# Patient Record
Sex: Male | Born: 1935 | Race: Black or African American | Hispanic: No | Marital: Married | Smoking: Former smoker
Health system: Southern US, Community
[De-identification: ages and names within clinical notes are randomized; demographics above are authoritative.]

## PROBLEM LIST (undated history)

## (undated) DIAGNOSIS — C61 Malignant neoplasm of prostate: Secondary | ICD-10-CM

## (undated) DIAGNOSIS — I272 Pulmonary hypertension, unspecified: Secondary | ICD-10-CM

## (undated) DIAGNOSIS — E7800 Pure hypercholesterolemia, unspecified: Secondary | ICD-10-CM

## (undated) DIAGNOSIS — J9611 Chronic respiratory failure with hypoxia: Secondary | ICD-10-CM

## (undated) DIAGNOSIS — I10 Essential (primary) hypertension: Secondary | ICD-10-CM

## (undated) DIAGNOSIS — I2699 Other pulmonary embolism without acute cor pulmonale: Secondary | ICD-10-CM

## (undated) DIAGNOSIS — Z720 Tobacco use: Secondary | ICD-10-CM

## (undated) DIAGNOSIS — G629 Polyneuropathy, unspecified: Secondary | ICD-10-CM

## (undated) DIAGNOSIS — R0600 Dyspnea, unspecified: Secondary | ICD-10-CM

## (undated) DIAGNOSIS — J449 Chronic obstructive pulmonary disease, unspecified: Secondary | ICD-10-CM

## (undated) DIAGNOSIS — I428 Other cardiomyopathies: Secondary | ICD-10-CM

## (undated) DIAGNOSIS — I7100 Dissection of unspecified site of aorta: Secondary | ICD-10-CM

## (undated) DIAGNOSIS — I442 Atrioventricular block, complete: Secondary | ICD-10-CM

## (undated) DIAGNOSIS — M1990 Unspecified osteoarthritis, unspecified site: Secondary | ICD-10-CM

## (undated) HISTORY — PX: PROSTATECTOMY: SHX69

## (undated) HISTORY — DX: Pure hypercholesterolemia, unspecified: E78.00

## (undated) HISTORY — PX: BI-VENTRICULAR PACEMAKER INSERTION (CRT-P): SHX5750

## (undated) HISTORY — DX: Malignant neoplasm of prostate: C61

## (undated) HISTORY — DX: Essential (primary) hypertension: I10

## (undated) HISTORY — DX: Chronic obstructive pulmonary disease, unspecified: J44.9

## (undated) HISTORY — DX: Tobacco use: Z72.0

## (undated) HISTORY — DX: Other cardiomyopathies: I42.8

## (undated) HISTORY — DX: Unspecified osteoarthritis, unspecified site: M19.90

## (undated) HISTORY — DX: Polyneuropathy, unspecified: G62.9

## (undated) HISTORY — DX: Other pulmonary embolism without acute cor pulmonale: I26.99

## (undated) HISTORY — DX: Atrioventricular block, complete: I44.2

---

## 1998-09-18 ENCOUNTER — Ambulatory Visit: Admission: RE | Admit: 1998-09-18 | Discharge: 1998-09-18 | Payer: Self-pay | Admitting: Pulmonary Disease

## 2000-02-06 ENCOUNTER — Ambulatory Visit (HOSPITAL_COMMUNITY): Admission: RE | Admit: 2000-02-06 | Discharge: 2000-02-06 | Payer: Self-pay | Admitting: *Deleted

## 2004-06-11 ENCOUNTER — Ambulatory Visit: Payer: Self-pay | Admitting: Internal Medicine

## 2005-03-11 ENCOUNTER — Ambulatory Visit: Payer: Self-pay | Admitting: Internal Medicine

## 2005-09-30 ENCOUNTER — Ambulatory Visit: Payer: Self-pay | Admitting: Internal Medicine

## 2005-10-14 ENCOUNTER — Ambulatory Visit: Payer: Self-pay | Admitting: Cardiology

## 2006-01-23 ENCOUNTER — Ambulatory Visit: Payer: Self-pay | Admitting: Internal Medicine

## 2006-02-17 ENCOUNTER — Ambulatory Visit (HOSPITAL_COMMUNITY): Admission: RE | Admit: 2006-02-17 | Discharge: 2006-02-17 | Payer: Self-pay | Admitting: Internal Medicine

## 2006-02-17 ENCOUNTER — Ambulatory Visit: Payer: Self-pay | Admitting: Internal Medicine

## 2006-04-07 ENCOUNTER — Ambulatory Visit: Payer: Self-pay | Admitting: Internal Medicine

## 2006-09-11 ENCOUNTER — Ambulatory Visit: Payer: Self-pay

## 2006-09-15 ENCOUNTER — Inpatient Hospital Stay (HOSPITAL_COMMUNITY): Admission: RE | Admit: 2006-09-15 | Discharge: 2006-09-16 | Payer: Self-pay | Admitting: Internal Medicine

## 2006-09-15 ENCOUNTER — Ambulatory Visit: Payer: Self-pay | Admitting: Internal Medicine

## 2006-10-07 ENCOUNTER — Ambulatory Visit: Payer: Self-pay | Admitting: Nurse Practitioner

## 2006-10-07 ENCOUNTER — Encounter: Payer: Self-pay | Admitting: Cardiology

## 2006-10-14 ENCOUNTER — Ambulatory Visit: Payer: Self-pay

## 2007-01-05 ENCOUNTER — Ambulatory Visit: Payer: Self-pay | Admitting: Internal Medicine

## 2007-02-19 ENCOUNTER — Ambulatory Visit: Payer: Self-pay | Admitting: Internal Medicine

## 2007-02-25 ENCOUNTER — Ambulatory Visit: Payer: Self-pay | Admitting: Cardiology

## 2007-02-25 ENCOUNTER — Ambulatory Visit: Payer: Self-pay | Admitting: Internal Medicine

## 2007-07-13 ENCOUNTER — Encounter: Payer: Self-pay | Admitting: Cardiology

## 2007-08-13 ENCOUNTER — Ambulatory Visit: Payer: Self-pay | Admitting: Internal Medicine

## 2008-02-18 ENCOUNTER — Ambulatory Visit: Payer: Self-pay | Admitting: Internal Medicine

## 2008-04-15 ENCOUNTER — Ambulatory Visit: Payer: Self-pay | Admitting: Cardiology

## 2008-04-16 ENCOUNTER — Encounter: Payer: Self-pay | Admitting: Cardiology

## 2008-04-17 ENCOUNTER — Encounter: Payer: Self-pay | Admitting: Cardiology

## 2008-04-19 ENCOUNTER — Ambulatory Visit: Admission: RE | Admit: 2008-04-19 | Discharge: 2008-04-19 | Payer: Self-pay | Admitting: Cardiology

## 2008-04-19 ENCOUNTER — Encounter: Payer: Self-pay | Admitting: Cardiology

## 2008-04-21 ENCOUNTER — Ambulatory Visit: Payer: Self-pay | Admitting: Cardiology

## 2008-04-21 ENCOUNTER — Ambulatory Visit (HOSPITAL_COMMUNITY): Admission: RE | Admit: 2008-04-21 | Discharge: 2008-04-21 | Payer: Self-pay | Admitting: Cardiology

## 2008-05-03 ENCOUNTER — Ambulatory Visit: Payer: Self-pay | Admitting: Cardiology

## 2008-05-11 ENCOUNTER — Ambulatory Visit: Payer: Self-pay | Admitting: Cardiology

## 2008-06-06 ENCOUNTER — Encounter: Payer: Self-pay | Admitting: Cardiology

## 2008-06-06 ENCOUNTER — Ambulatory Visit (HOSPITAL_COMMUNITY): Admission: RE | Admit: 2008-06-06 | Discharge: 2008-06-06 | Payer: Self-pay | Admitting: Cardiology

## 2008-06-06 ENCOUNTER — Ambulatory Visit: Payer: Self-pay | Admitting: Internal Medicine

## 2008-06-19 ENCOUNTER — Ambulatory Visit: Payer: Self-pay | Admitting: Cardiology

## 2008-09-16 ENCOUNTER — Encounter (INDEPENDENT_AMBULATORY_CARE_PROVIDER_SITE_OTHER): Payer: Self-pay | Admitting: *Deleted

## 2008-12-25 ENCOUNTER — Ambulatory Visit: Payer: Self-pay | Admitting: Cardiology

## 2009-01-31 ENCOUNTER — Encounter: Payer: Self-pay | Admitting: Cardiology

## 2009-02-21 DIAGNOSIS — R0602 Shortness of breath: Secondary | ICD-10-CM | POA: Insufficient documentation

## 2009-02-21 DIAGNOSIS — Z950 Presence of cardiac pacemaker: Secondary | ICD-10-CM

## 2009-02-21 DIAGNOSIS — I429 Cardiomyopathy, unspecified: Secondary | ICD-10-CM | POA: Insufficient documentation

## 2009-03-01 ENCOUNTER — Encounter: Payer: Self-pay | Admitting: Cardiology

## 2009-03-29 ENCOUNTER — Encounter: Payer: Self-pay | Admitting: Cardiology

## 2009-07-17 ENCOUNTER — Ambulatory Visit: Payer: Self-pay | Admitting: Cardiology

## 2009-07-17 DIAGNOSIS — Z86718 Personal history of other venous thrombosis and embolism: Secondary | ICD-10-CM

## 2009-07-17 DIAGNOSIS — I442 Atrioventricular block, complete: Secondary | ICD-10-CM

## 2009-07-17 DIAGNOSIS — J439 Emphysema, unspecified: Secondary | ICD-10-CM

## 2009-07-20 ENCOUNTER — Encounter (INDEPENDENT_AMBULATORY_CARE_PROVIDER_SITE_OTHER): Payer: Self-pay | Admitting: *Deleted

## 2009-08-03 ENCOUNTER — Encounter: Payer: Self-pay | Admitting: Cardiology

## 2010-01-21 ENCOUNTER — Encounter (INDEPENDENT_AMBULATORY_CARE_PROVIDER_SITE_OTHER): Payer: Self-pay | Admitting: *Deleted

## 2010-02-04 ENCOUNTER — Encounter: Payer: Self-pay | Admitting: Cardiology

## 2010-02-07 ENCOUNTER — Ambulatory Visit: Payer: Self-pay | Admitting: Cardiology

## 2010-02-07 DIAGNOSIS — G609 Hereditary and idiopathic neuropathy, unspecified: Secondary | ICD-10-CM | POA: Insufficient documentation

## 2010-02-15 ENCOUNTER — Encounter: Payer: Self-pay | Admitting: Cardiology

## 2010-02-23 ENCOUNTER — Encounter: Payer: Self-pay | Admitting: Internal Medicine

## 2010-02-27 ENCOUNTER — Encounter (INDEPENDENT_AMBULATORY_CARE_PROVIDER_SITE_OTHER): Payer: Self-pay | Admitting: *Deleted

## 2010-04-24 ENCOUNTER — Encounter: Payer: Self-pay | Admitting: Cardiology

## 2010-05-06 ENCOUNTER — Encounter: Payer: Self-pay | Admitting: Internal Medicine

## 2010-05-06 ENCOUNTER — Ambulatory Visit: Payer: Self-pay | Admitting: Internal Medicine

## 2010-06-25 NOTE — Letter (Signed)
Summary: Engineer, materials at [facility]  [address]   [place], [place] [address]   Phone: [phone]  Fax: [phone]        July 20, 2009 MRN: [id number]   TRESHON STANNARD [address] [place], [place]  [address]   Dear Mr. Schildt,  Your test ordered by Selena Batten has been reviewed by your physician (or physician assistant) and was found to be normal or stable. Your physician (or physician assistant) felt no changes were needed at this time.  ____ Echocardiogram  ____ Cardiac Stress Test  _X___ Lab Work  ____ Peripheral vascular study of arms, legs or neck  ____ CT scan or X-ray  ____ Lung or Breathing test  ____ Other:   Thank you.   Hoover Brunette, LPN    Duane Boston, M.D., F.A.C.C. Thressa Sheller, M.D., F.A.C.C. Oneal Grout, M.D., F.A.C.C. Cheree Ditto, M.D., F.A.C.C. Daiva Nakayama, M.D., F.A.C.C. Kenney Houseman, M.D., F.A.C.C. Jeanne Ivan, PA-C

## 2010-06-25 NOTE — Letter (Signed)
Summary: External Correspondence/ [facility]  External Correspondence/ [facility]   Imported By: Dorise Hiss 04/24/2010 09:26:10  _____________________________________________________________________  External Attachment:    Type:   Image     Comment:   External Document

## 2010-06-25 NOTE — Letter (Signed)
Summary: Device-Delinquent Check  [facility]  [address], [place] [address]   Phone: [phone]  Fax: [phone]     January 21, 2010 MRN: [id number]   KOBI MARIO [address] [place], [place]  [address]   Dear Mr. Chandler,  According to our records, you have not had your implanted device checked in the recommended period of time.  We are unable to determine appropriate device function without checking your device on a regular basis.  Please call our office to schedule an appointment , with Dr.Allred in [place], as soon as possible.  If you are having your device checked by another physician, please call us so that we may update our records.  Thank you,  Altha Harm, LPN  January 21, 2010 4:17 PM  [facility]  certified mail  Appended Document: Device-Delinquent Check Patient called back regarding above letter.  States he would like to continue to see Dr. Graciela Husbands instead of Dr. Johney Frame.  He is aware that he would have to come to [facility]. office for this.  Does have scheduled visit with Dr. Johney Frame for December.  Please call pt to schedule device check and [facility] appt. May call back at home number or cell:  [phone].  Appended Document: Device-Delinquent Check Please address.

## 2010-06-25 NOTE — Assessment & Plan Note (Signed)
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 He also has a history of pulmonary embolism and is on chronic Coumadin therapy. From cardiac standpoint has been doing well. He denies any chest pain orthopnea PND palpitations or syncope.  He reports a burning in tingling sensation in both lower extremities below the knee. He has no diabetes as far as we know. He had arterial Dopplers done in Dr. Margaretmary Eddy office and report was negative. I recommended for the patient to proceed with a neurology evaluation.  Preventive Screening-Counseling & Management  Alcohol-Tobacco     Smoking Status: quit     Year Quit: 1995  Current Medications (verified): 1)  Flovent Hfa 44 Mcg/act Aero (Fluticasone Propionate  Hfa) .... Inhale 2 Puff Using Inhaler Twice A Day 2)  Carvedilol 25 Mg Tabs (Carvedilol) .... Take 1 Tablet By Mouth Two Times A Day 3)  Warfarin Sodium 5 Mg Tabs (Warfarin Sodium) .... Use As Directed 4)  Multivitamins  Tabs (Multiple Vitamin) .... Take 1 Tablet By Mouth Once A Day 5)  Doxycycline Hyclate 100 Mg Solr (Doxycycline Hyclate) .... Take 1 Tablet By Mouth Two Times A Day 6)  Prednisone 10 Mg Tabs (Prednisone) .... Use As Directed  Allergies (verified): 1)  ! Asa  Comments:  Nurse/Medical Assistant: The patient is currently on medications but does not know the name or dosage at this time. Instructed to contact our office with details. Will update medication list at that time.  Past History:  Past Medical History: Last updated: 07/17/2009 1. Chronic exertional dyspnea secondary to chronic obstructive     pulmonary disease. 2. History of pulmonary  embolism on chronic Coumadin therapy. 3. Mild nonischemic cardiomyopathy with ejection fraction 45-50%. 4. History of complete heart block.     a.     Status post permanent pacemaker implantation of CRT device      and defibrillator upgrade in 2008. 5. Lisinopril causes angioedema. 6. Tobacco use.  Past Surgical History: Last updated: 02/21/2009 Prostatectomy ICD - St Jude [place]  Social History: Last updated: 02/21/2009 Married  Tobacco Use - Former.  Alcohol Use - yes  Risk Factors: Smoking Status: quit (02/07/2010)  Review of Systems  The patient denies fatigue, malaise, fever, weight gain/loss, vision loss, decreased hearing, hoarseness, chest pain, palpitations, shortness of breath, prolonged cough, wheezing, sleep apnea, coughing up blood, abdominal pain, blood in stool, nausea, vomiting, diarrhea, heartburn, incontinence, blood in urine, muscle weakness, joint pain, leg swelling, rash, skin lesions, headache, fainting, dizziness, depression, anxiety, enlarged lymph nodes, easy bruising or bleeding, and environmental allergies.    Vital Signs:  Patient profile:   77 year old male Height:      71 inches Weight:      183 pounds Pulse rate:   72 / minute BP sitting:   144 / 90  (left arm) Cuff size:   regular  Vitals Entered By: Carlye Grippe (February 07, 2010 10:05 AM)  Physical Exam  Additional Exam:  General: Well-developed, well-nourished in no distress head: Normocephalic and atraumatic eyes PERRLA/EOMI intact, conjunctiva and lids normal nose: No deformity or lesions mouth normal dentition, normal posterior pharynx neck: Supple, no JVD.  No masses, thyromegaly or  abnormal cervical nodes Chest: Device without any swelling or erythema lungs: Normal breath sounds bilaterally without wheezing.  Normal percussion heart: regular rate and rhythm with normal S1 and S2, no S3 or S4.  PMI is normal.  No pathological murmurs abdomen: Normal bowel sounds, abdomen is  soft and nontender without masses, organomegaly or hernias noted.  No hepatosplenomegaly musculoskeletal: Back normal, normal gait muscle strength and tone normal pulsus: Pulse is normal in all 4 extremities Extremities: No peripheral pitting edema neurologic: Alert and oriented x 3 skin: Intact without lesions or rashes cervical nodes: No significant adenopathy psychologic: Normal affect    PPM Specifications Following MD:  Sherryl Manges, MD     PPM Vendor:  St Jude     PPM Model Number:  [id number]     PPM Serial Number:  [id number] PPM DOI:  09/15/2006     PPM Implanting MD:  Sherryl Manges, MD  Lead 1    Location: RA     DOI: 07/27/1997     Model #: [id number]     Serial #: [id number]     Status: active Lead 2    Location: RV     DOI: 07/27/1997     Model #: [id number]     Serial #: [id number]     Status: active Lead 3    Location: LV     DOI: 09/15/2006     Model #: [id number]     Serial #: [id number]     Status: active   Indications:  ICM, Complete heart block   Impression & Recommendations:  Problem # 1:  PULMONARY EMBOLISM, HX OF (ICD-V12.51) patient has a history of DVT and pulmonary was on 2 separate occasions and will be on lifelong Coumadin therapy. His updated medication list for this problem includes:    Warfarin Sodium 5 Mg Tabs (Warfarin sodium) ..... Use as directed  Problem # 2:  ICD - IN SITU (ICD-V45.02) no defibrillator discharges reported. Patient has a CRT-D device  Problem # 3:  CARDIOMYOPATHY, SECONDARY (ICD-425.9) the patient has a nonischemic cardiomyopathy and reports no heart failure symptoms. He only has mild LV dysfunction he cannot be placed on ACE inhibitors due to a history of angioedema. His updated medication list for this problem includes:    Carvedilol 25 Mg Tabs (Carvedilol) .Marland Kitchen... Take 1 tablet by mouth two times a day    Warfarin Sodium 5 Mg Tabs (Warfarin sodium) ..... Use as directed  Problem # 4:  PERIPHERAL NEUROPATHY (ICD-356.9) unclear etiology. The patient will  be referred to neurology EMGs and nerve conduction velocity studies may be indicated.  Patient Instructions: 1)  EP follow up  2)  Referral for Neurology in [place] 3)  Follow up in  6 months

## 2010-06-25 NOTE — Letter (Signed)
Summary: External Correspondence/ FAXED [facility]  External Correspondence/ [facility]   Imported By: Dorise Hiss 03/12/2010 15:24:17  _____________________________________________________________________  External Attachment:    Type:   Image     Comment:   External Document

## 2010-06-25 NOTE — Letter (Signed)
Summary: Device-Delinquent Check  [facility], Main Office  [address]   [place], [place] [address]   Phone: [phone]  Fax: [phone]     February 27, 2010 MRN: [id number]   [name] [name] [address] [place], [place]  [address]   Dear [name],  According to our records, you have not had your implanted device checked in the recommended period of time.  We are unable to determine appropriate device function without checking your device on a regular basis.  Please call our office to schedule an appointment as soon as possible with Dr. Graciela Husbands.  If you are having your device checked by another physician, please call us so that we may update our records.  Thank you,   [facility]

## 2010-06-25 NOTE — Assessment & Plan Note (Signed)
Summary: 1 yr fu recv letter   Visit Type:  Follow-up Primary Provider:  [name]  CC:  follow-up visit.  History of Present Illness: the patient is a 77 year old male with a history of chronic dyspnea secondary to COPD. The patient also has a history of pulmonary and wasn't is on chronic Coumadin therapy. He has a history of complete heart block status post permanent pacemaker implantation with a CRT-D device. The patient had an upgrade in 2008.the patient is unable to tolerate ACE inhibitors due to angioedema.  The patient recently had increased shortness of breath and has been seeing Dr. Orson Aloe. It was felt that his dyspnea secondary to COPD exacerbation. The patient is an NYHA class IIB. He reports occasional dry hacking cough. He denies any chest pain orthopnea PND. His electrocardiogram demonstrates an AV sequential paced rhythm with dual chamber pacing.  Clinical Review Panels:  CXR CXR results Mild cardiomegaly is stable. Marked enlargement of the         central pulmonary arteries is again seen, consistent with pulmonary         arterial hypertension.  Pulmonary hyperinflation is again seen,         consistent with COPD.  Triple lead transvenous pacemaker remains in         stable position.                   Both lungs are clear.  There is no evidence of pleural effusion.                   IMPRESSION:         1.  No acute findings.         2.  Pulmonary arterial hypertension.  Mild cardiomegaly. (04/15/2008)  Echocardiogram Echocardiogram  Conclusions:         1. The estimated ejection fraction is 45-50%.          2. Global left ventricular wall motion and contractility are within         normal limits.         3. Mild concentric left ventricular hypertrophy is observed.         4. The  basal inferolateral, basal inferior and mid inferolateral wall         segments are hypokinetic          5. A pacemaker wire is visualized in the right ventricle.         6. The right  atrium is mildly dilated.          7. A pacemaker wire is visualized in the right atrium.         8. There is mitral annular calcification.         9. There is a trace of mitral regurgitation.         10. There is mild tricuspid regurgitation.         11. The right ventricular systolic pressure is calculated at 47 mmHg. (04/17/2008)    Preventive Screening-Counseling & Management  Alcohol-Tobacco     Smoking Status: quit     Year Quit: 1995  Current Medications (verified): 1)  Flovent Hfa 44 Mcg/act Aero (Fluticasone Propionate  Hfa) .... Inhale 2 Puff Using Inhaler Twice A Day 2)  Carvedilol 25 Mg Tabs (Carvedilol) .... Take 1 Tablet By Mouth Two Times A Day 3)  Warfarin Sodium 5 Mg Tabs (Warfarin Sodium) .... Use As Directed 4)  Multivitamins  Tabs (Multiple Vitamin) .Marland KitchenMarland KitchenMarland Kitchen  Take 1 Tablet By Mouth Once A Day 5)  Doxycycline Hyclate 100 Mg Solr (Doxycycline Hyclate) .... Take 1 Tablet By Mouth Two Times A Day 6)  Prednisone 10 Mg Tabs (Prednisone) .... Use As Directed  Allergies (verified): 1)  ! Asa  Comments:  Nurse/Medical Assistant: The patient's medications and allergies were reviewed with the patient and were updated in the Medication and Allergy Lists.Bottles reviewed.  Review of Systems       The patient complains of shortness of breath.  The patient denies fatigue, malaise, fever, weight gain/loss, vision loss, decreased hearing, hoarseness, chest pain, palpitations, prolonged cough, wheezing, sleep apnea, coughing up blood, abdominal pain, blood in stool, nausea, vomiting, diarrhea, heartburn, incontinence, blood in urine, muscle weakness, joint pain, leg swelling, rash, skin lesions, headache, fainting, dizziness, depression, anxiety, enlarged lymph nodes, easy bruising or bleeding, and environmental allergies.    Vital Signs:  Patient profile:   77 year old male Height:      71 inches Weight:      181 pounds BMI:     25.34 Pulse rate:   69 / minute BP sitting:    128 / 74  (left arm) Cuff size:   regular  Vitals Entered By: Carlye Grippe (July 17, 2009 8:55 AM)  Nutrition Counseling: Patient's BMI is greater than 25 and therefore counseled on weight management options. CC: follow-up visit   Physical Exam  Additional Exam:  General: Well-developed, well-nourished in no distress head: Normocephalic and atraumatic eyes PERRLA/EOMI intact, conjunctiva and lids normal nose: No deformity or lesions mouth normal dentition, normal posterior pharynx neck: Supple, no JVD.  No masses, thyromegaly or abnormal cervical nodes Chest: Device without any swelling or erythema lungs: Normal breath sounds bilaterally without wheezing.  Normal percussion heart: regular rate and rhythm with normal S1 and S2, no S3 or S4.  PMI is normal.  No pathological murmurs abdomen: Normal bowel sounds, abdomen is soft and nontender without masses, organomegaly or hernias noted.  No hepatosplenomegaly musculoskeletal: Back normal, normal gait muscle strength and tone normal pulsus: Pulse is normal in all 4 extremities Extremities: No peripheral pitting edema neurologic: Alert and oriented x 3 skin: Intact without lesions or rashes cervical nodes: No significant adenopathy psychologic: Normal affect    PPM Specifications Following MD:  Sherryl Manges, MD     PPM Vendor:  St Jude     PPM Model Number:  [id number]     PPM Serial Number:  [id number] PPM DOI:  09/15/2006     PPM Implanting MD:  Sherryl Manges, MD  Lead 1    Location: RA     DOI: 07/27/1997     Model #: [id number]     Serial #: [id number]     Status: active Lead 2    Location: RV     DOI: 07/27/1997     Model #: [id number]     Serial #: [id number]     Status: active Lead 3    Location: LV     DOI: 09/15/2006     Model #: [id number]     Serial #: [id number]     Status: active   Indications:  ICM, Complete heart block   Impression & Recommendations:  Problem # 1:  COPD (ICD-496) the patient reports worsening dyspnea. I suspect this  is largely due to his COPD. There is no definite evidence of worsening heart failure symptoms by physical examination. His updated medication list for this problem includes:    Flovent Hfa  44 Mcg/act Aero (Fluticasone propionate  hfa) ..... Inhale 2 puff using inhaler twice a day  Problem # 2:  CARDIOMYOPATHY, SECONDARY (ICD-425.9) the patient only has mild LV systolic dysfunction with an ejection fraction of 45-50%. CRT-D appears to be functioning appropriately. I will check an SMA-7 and a BNP level to make sure the patient does not need diuretic therapy but I do not think so. His updated medication list for this problem includes:    Carvedilol 25 Mg Tabs (Carvedilol) .Marland Kitchen... Take 1 tablet by mouth two times a day    Warfarin Sodium 5 Mg Tabs (Warfarin sodium) ..... Use as directed  Orders: T-Basic Metabolic Panel [phone]) T-BNP  (B Natriuretic Peptide) ([id number])  Problem # 3:  ICD - IN SITU (ICD-V45.02) Assessment: Comment Only  Orders: EKG w/ Interpretation (93000)  Problem # 4:  COUMADIN THERAPY (ICD-V58.61) Assessment: Comment Only  Patient Instructions: 1)  Labs today 2)  Follow up in  6 months

## 2010-06-25 NOTE — Miscellaneous (Signed)
Summary: Orders Update - Neurology  Clinical Lists Changes  Orders: Added new Referral order of Neurology Referral (Neuro) - Signed 

## 2010-06-25 NOTE — Cardiovascular Report (Signed)
Summary: Certified Letter - Delivered (Appt. Made)  Certified Letter - Delivered (Appt. Made)   Imported By: Debby Freiberg 04/01/2010 14:34:59  _____________________________________________________________________  External Attachment:    Type:   Image     Comment:   External Document

## 2010-06-27 NOTE — Procedures (Signed)
Summary: pc2  --agh   Current Medications (verified): 1)  Flovent Hfa 44 Mcg/act Aero (Fluticasone Propionate  Hfa) .... Inhale 2 Puff Using Inhaler Twice A Day 2)  Carvedilol 25 Mg Tabs (Carvedilol) .... Take 1 Tablet By Mouth Two Times A Day 3)  Warfarin Sodium 5 Mg Tabs (Warfarin Sodium) .... Use As Directed 4)  Multivitamins  Tabs (Multiple Vitamin) .... Take 1 Tablet By Mouth Once A Day  Allergies (verified): 1)  ! Asa  PPM Specifications Following MD:  Sherryl Manges, MD     PPM Vendor:  St Jude     PPM Model Number:  [id number]     PPM Serial Number:  [id number] PPM DOI:  09/15/2006     PPM Implanting MD:  Sherryl Manges, MD  Lead 1    Location: RA     DOI: 07/27/1997     Model #: [id number]     Serial #: [id number]     Status: active Lead 2    Location: RV     DOI: 07/27/1997     Model #: [id number]     Serial #: [id number]     Status: active Lead 3    Location: LV     DOI: 09/15/2006     Model #: [id number]     Serial #: [id number]     Status: active   Indications:  ICM, Complete heart block   PPM Follow Up Battery Voltage:  2.78 V     Battery Est. Longevity:  2 yrs       PPM Device Measurements Atrium  Amplitude: 1.5 mV, Impedance: 339 ohms, Threshold: 0.75 V at 0.5 msec Right Ventricle  Amplitude: PACED mV, Impedance: 510 ohms, Threshold: 0.75 V at 0.5 msec Left Ventricle  Impedance: 435 ohms, Threshold: 0.75 V at 0.5 msec  Episodes MS Episodes:  16     Percent Mode Switch:  <1%     Ventricular High Rate:  0     Atrial Pacing:  90%     Ventricular Pacing:  >99%  Parameters Mode:  DDDR     Lower Rate Limit:  70     Upper Rate Limit:  125 Next Cardiology Appt Due:  10/25/2010 Tech Comments:  NORMAL DEVICE FUNCTION.  NO CHANGES MADE. ROV IN 6 MTHS W/SK. Vella Kohler  May 06, 2010 1:01 PM

## 2010-06-27 NOTE — Cardiovascular Report (Signed)
Summary: Office Visit   Office Visit   Imported By: Roderic Ovens 05/13/2010 16:19:24  _____________________________________________________________________  External Attachment:    Type:   Image     Comment:   External Document

## 2010-08-01 ENCOUNTER — Encounter: Payer: Self-pay | Admitting: *Deleted

## 2010-08-16 ENCOUNTER — Encounter: Payer: Self-pay | Admitting: Cardiology

## 2010-08-16 ENCOUNTER — Other Ambulatory Visit: Payer: Self-pay | Admitting: Cardiology

## 2010-08-16 ENCOUNTER — Ambulatory Visit (INDEPENDENT_AMBULATORY_CARE_PROVIDER_SITE_OTHER): Payer: Medicare Other | Admitting: Cardiology

## 2010-08-16 VITALS — BP 123/78 | HR 70 | Ht 71.0 in | Wt 177.0 lb

## 2010-08-16 DIAGNOSIS — R0602 Shortness of breath: Secondary | ICD-10-CM

## 2010-08-16 DIAGNOSIS — T783XXA Angioneurotic edema, initial encounter: Secondary | ICD-10-CM

## 2010-08-16 DIAGNOSIS — Z95810 Presence of automatic (implantable) cardiac defibrillator: Secondary | ICD-10-CM

## 2010-08-16 DIAGNOSIS — I429 Cardiomyopathy, unspecified: Secondary | ICD-10-CM

## 2010-08-16 NOTE — Assessment & Plan Note (Signed)
status post CRT-D: Reviewed EKG.  Normal biventricular pacing.

## 2010-08-16 NOTE — Progress Notes (Signed)
HPI The patient is an 77 year old male with a history of nonischemic cardiomyopathy.  Prior ejection fraction 45 to 50%.  The patient is status post permanent pacemaker implantation with a CRT-D device upgrade to defibrillator in 2008.  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  He also has a history of pulmonary embolism and is on chronic Coumadin therapy.  He was seen 6 months ago and he was doing well.  At that time he denied any chest pain or any other cardiovascular symptoms. The patient had blood work done by his primary care physician a few months ago.  His creatinine is .99 liver function tests are within normal limits potassium is within normal limits TSH is within normal limits.  CBC was also within normal limits. The patient's last echocardiogram was in 2009.  Allergies  Allergen Reactions  . Lisinopril Swelling    Angioedema  . Aspirin     Current Outpatient Prescriptions on File Prior to Visit  Medication Sig Dispense Refill  . carvedilol (COREG) 25 MG tablet Take 1 tablet by mouth twice per day.       . fluticasone (FLOVENT HFA) 44 MCG/ACT inhaler Inhale 2 puffs into the lungs 2 (two) times daily.        . Multiple Vitamin (MULTIVITAMIN) tablet Take 1 tablet by mouth daily.       Marland Kitchen warfarin (COUMADIN) 5 MG tablet Take daily by mouth as directed by the [facility] (Dr. [name])        Past Medical History  Diagnosis Date  . COPD (chronic obstructive pulmonary disease)     Chronic DOE  . Pulmonary embolism     Chronic Coumadin Therapy  . Nonischemic cardiomyopathy     Mild-EF 45-50%  . CHB (complete heart block)     S/P PTVP of CRT device and ICD upgrade in 2008  . Tobacco abuse     Past Surgical History  Procedure Date  . Prostatectomy   . Cardiac defibrillator placement 2008    St. Jude Upgraded (2008)    No family history on file.  History   Social History  . Marital Status: Married    Spouse Name: N/A    Number of Children:  N/A  . Years of Education: N/A   Occupational History  . Not on file.   Social History Main Topics  . Smoking status: Former Smoker -- 0.5 packs/day for 52 years    Types: Cigarettes    Quit date: 05/26/1993  . Smokeless tobacco: Former Neurosurgeon    Types: Chew  . Alcohol Use: No     denies alcohol use  . Drug Use: No  . Sexually Active: Not on file   Other Topics Concern  . Not on file   Social History Narrative  . No narrative on file   Review of systems:  The patient denies any chest pain.  He has no shortness of breath orthopnea PND.  Is doing well from a cardiovascular perspective.  He denies any palpitations or syncope.  The remainder of the 18 Office Spirometry Results:   systems is within normal limits. PHYSICAL EXAM BP 123/78  Pulse 70  Ht 5\' 11"  (1.803 m)  Wt 177 lb (80.287 kg)  BMI 24.69 kg/m2  General: Well-developed, well-nourished in no distress Head: Normocephalic and atraumatic Eyes:PERRLA/EOMI intact, conjunctiva and lids normal Ears: No deformity or lesions Mouth:normal dentition, normal posterior pharynx Neck: Supple, no JVD.  No masses, thyromegaly or abnormal cervical nodes Lungs:  Normal breath sounds bilaterally without wheezing.  Normal percussion Cardiac: regular rate and rhythm with normal S1 and S2, no S3 or S4.  PMI is normal.  No pathological murmurs Abdomen: Normal bowel sounds, abdomen is soft and nontender without masses, organomegaly or hernias noted.  No hepatosplenomegaly MSK: Back normal, normal gait muscle strength and tone normal Vascular: Pulse is normal in all 4 extremities Extremities: No peripheral pitting edema Neurologic: Alert and oriented x 3 Skin: Intact without lesions or rashes Lymphatics: No significant adenopathyPsychologic: Normal affect   ECG: AV sequential paced rhythm but normal biventricular pacing function  ASSESSMENT AND PLAN

## 2010-08-16 NOTE — Assessment & Plan Note (Signed)
nonischemic cardiomyopathy: Ejection fraction 45 to 50%.  The patient needs repeat echocardiogram.  He needs a reassessment of his LV function.  He is unable to take ACE inhibitor because of angioedema.  If there is decrease in  LV function we could consider a combination of nitrates and hydralazine

## 2010-08-16 NOTE — Assessment & Plan Note (Signed)
angioedema secondary to lisinopril

## 2010-08-23 DIAGNOSIS — R079 Chest pain, unspecified: Secondary | ICD-10-CM

## 2010-08-30 ENCOUNTER — Encounter: Payer: Self-pay | Admitting: Cardiology

## 2010-08-30 DIAGNOSIS — I272 Pulmonary hypertension, unspecified: Secondary | ICD-10-CM | POA: Insufficient documentation

## 2010-09-03 ENCOUNTER — Telehealth: Payer: Self-pay | Admitting: *Deleted

## 2010-09-03 NOTE — Telephone Encounter (Signed)
Message copied by Hoover Brunette on Tue Sep 03, 2010  2:59 PM ------      Message from: Lewayne Bunting      Created: Fri Aug 30, 2010  6:57 AM       EF has improved, but patient has developed significant tricuspid regurgitation and severe pulmonary hypertension with PAS 75 mmHg. This could from ICD, but not sure. He will need a right heart catheterization. Ask if he is willing to proceed with RHC w/o visit, or otherwise see me first on first available. He will need to hold coumadin just for 3 days for RHC only. He has a history of pulmonary embolism and is on warfarin therapy and pending results RHC may need further wotk- up like V/Q scan etc..Marland KitchenSometimes however the measurement of PA pressures is not always accurate by ECHO, therefore RHC first to confirm.

## 2010-09-03 NOTE — Telephone Encounter (Signed)
Patient notified of results of abnormal echo & verbalized understanding.  OV scheduled for 5/15 to discuss further.

## 2010-09-25 ENCOUNTER — Encounter: Payer: Self-pay | Admitting: *Deleted

## 2010-09-25 ENCOUNTER — Encounter: Payer: Self-pay | Admitting: Cardiology

## 2010-09-25 ENCOUNTER — Ambulatory Visit (INDEPENDENT_AMBULATORY_CARE_PROVIDER_SITE_OTHER): Payer: Medicare Other | Admitting: Cardiology

## 2010-09-25 DIAGNOSIS — R0602 Shortness of breath: Secondary | ICD-10-CM

## 2010-09-25 DIAGNOSIS — Z01810 Encounter for preprocedural cardiovascular examination: Secondary | ICD-10-CM

## 2010-09-25 DIAGNOSIS — Z86718 Personal history of other venous thrombosis and embolism: Secondary | ICD-10-CM

## 2010-09-25 DIAGNOSIS — I059 Rheumatic mitral valve disease, unspecified: Secondary | ICD-10-CM

## 2010-09-25 DIAGNOSIS — Z86711 Personal history of pulmonary embolism: Secondary | ICD-10-CM

## 2010-09-25 DIAGNOSIS — I429 Cardiomyopathy, unspecified: Secondary | ICD-10-CM

## 2010-09-25 DIAGNOSIS — I359 Nonrheumatic aortic valve disorder, unspecified: Secondary | ICD-10-CM

## 2010-09-25 DIAGNOSIS — J449 Chronic obstructive pulmonary disease, unspecified: Secondary | ICD-10-CM

## 2010-09-25 DIAGNOSIS — I272 Pulmonary hypertension, unspecified: Secondary | ICD-10-CM

## 2010-09-25 DIAGNOSIS — I442 Atrioventricular block, complete: Secondary | ICD-10-CM

## 2010-09-25 DIAGNOSIS — G609 Hereditary and idiopathic neuropathy, unspecified: Secondary | ICD-10-CM

## 2010-09-25 NOTE — Assessment & Plan Note (Signed)
ABIs and EMGs as well as nerve conduction velocity testing were within normal limits

## 2010-09-25 NOTE — Assessment & Plan Note (Signed)
Mild LV dysfunction unlikely etiology for his dyspnea. As a matter of fact his most recent echocardiogram showed an ejection fraction of 50-55%.

## 2010-09-25 NOTE — Patient Instructions (Addendum)
   CT Angiogram of chest with contrast for PE this week  Labs to be done prior to CT above for pre-cath labs   Pending cath for next week - if CT negative for PE, will just hold coumadin 4-5 days prior to cath & will not need bridging with Lovenox   Follow up will be given at time of discharge from cath procedure

## 2010-09-25 NOTE — Assessment & Plan Note (Signed)
Patient is on chronic Coumadin therapy for prior history of DVT and pulmonary embolism.

## 2010-09-25 NOTE — Progress Notes (Signed)
HPI The patient is an 77 -year-old male with history of nonischemic cardiomyopathy ejection fraction 45-50%, status post permanent pacemaker implantation with a CRT device and the femoral upgrading 2008. 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 He also has a history of chronic Coumadin therapy for prior history of pulmonary embolism. He was last seen in the office in September of 2011. Last office visit the patient complained of lower extremity pain, but I did not feel this was secondary to claudication but rather due to neuropathy he was referred to neurology in [place]. The patient was indeed seen it was felt that the differential diagnosis is musculoskeletal or peripheral neuropathy. He was scheduled for EMG and nerve conduction studies, apparently EMGs and nerve conduction velocities were normal per the patient report in his ABIs were also normal. Ultimately it was felt that his pain was more musculoskeletal probably originating from both knees. The patient was actually admitted to [facility] in November of 2011 0.3 shortness of breath.. He underwent a stress test. Ejection fraction was 42%. There was some suggestion of inferior scar but no ischemia. Echocardiogram done on 08/23/2010 demonstrates left ventricular hypertrophy, ejection fraction 50-55%. Abnormal diastolic function and mild to moderate aortic regurgitation with mild aortic stenosis. There was mild to moderate tricuspid regurgitation with right ventricular systolic pressure estimated to be 70-75 mm of mercury. There was evidence of severe pulmonary hypertension. Of note is also the patient had a CPX test done January 2010. It was felt that exercise gas exchange was a limited by the early cessation of exercise due to exercise-induced hypoxemia. Based on the PFTs and a desaturation with exercise it appears the patient's lung disease as the primary cause of his exercise intolerance. He was referred after this to Dr.  Orson Aloe. There were no additional recommendations or further therapy instituted. The patient states that he has shortness of breath on minimal exertion. He feels that his symptoms have worsened. He denies any chest pain.  Allergies  Allergen Reactions  . Lisinopril Swelling    Angioedema  . Aspirin     Current Outpatient Prescriptions on File Prior to Visit  Medication Sig Dispense Refill  . carvedilol (COREG) 25 MG tablet Take 1 tablet by mouth twice per day.       . Multiple Vitamin (MULTIVITAMIN) tablet Take 1 tablet by mouth daily.       Marland Kitchen warfarin (COUMADIN) 5 MG tablet Take daily by mouth as directed by the [facility] (Dr. [name])      . fluticasone (FLOVENT HFA) 44 MCG/ACT inhaler Inhale 2 puffs into the lungs 2 (two) times daily.          Past Medical History  Diagnosis Date  . COPD (chronic obstructive pulmonary disease)     Chronic DOE  . Pulmonary embolism     Chronic Coumadin Therapy  . Nonischemic cardiomyopathy     Mild-EF 45-50%  . CHB (complete heart block)     S/P PTVP of CRT device and ICD upgrade in 2008  . Tobacco abuse     Past Surgical History  Procedure Date  . Prostatectomy   . Cardiac defibrillator placement 2008    St. Jude Upgraded (2008)    No family history on file.  History   Social History  . Marital Status: Married    Spouse Name: N/A    Number of Children: N/A  . Years of Education: N/A   Occupational History  . Not on file.   Social  History Main Topics  . Smoking status: Former Smoker -- 0.5 packs/day for 52 years    Types: Cigarettes    Quit date: 05/26/1993  . Smokeless tobacco: Former Neurosurgeon    Types: Chew  . Alcohol Use: No     denies alcohol use  . Drug Use: No  . Sexually Active: Not on file   Other Topics Concern  . Not on file   Social History Narrative  . No narrative on file   Review of systems:Pertinent positives as outlined above. The remainder of the 18  point review of systems is  negative  PHYSICAL EXAM BP 115/69  Pulse 80  Ht 6' (1.829 m)  Wt 171 lb 1.9 oz (77.62 kg)  BMI 23.21 kg/m2 General: Well-developed, well-nourished in no distress Head: Normocephalic and atraumatic Eyes:PERRLA/EOMI intact, conjunctiva and lids normal Ears: No deformity or lesions Mouth:normal dentition, normal posterior pharynx Neck: Supple, no JVD.  No masses, thyromegaly or abnormal cervical nodes Lungs: Normal breath sounds bilaterally without wheezing.  Normal percussion Cardiac: regular rate and rhythm with normal S1 and S2, no S3 or S4.  PMI is normal.  2/6 systolic ejection murmur right upper sternal border and a 2/6 diastolic murmur loudest at the left lower sternal border, there is also a 3/6 holosystolic murmur towards the apex.Abdomen: Normal bowel sounds, abdomen is soft and nontender without masses, organomegaly or hernias noted.  No hepatosplenomegaly MSK: Back normal, normal gait muscle strength and tone normal Vascular: Pulse is normal in all 4 extremities Extremities: No peripheral pitting edema Neurologic: Alert and oriented x 3 Skin: Intact without lesions or rashes Lymphatics: No significant adenopathy Psychologic: Normal affect   ECG:  ASSESSMENT AND PLAN

## 2010-09-25 NOTE — Assessment & Plan Note (Signed)
Patient has been previously referred to Dr. Orson Aloe. I'm not sure if a primary diagnosis of lung disease has been established. Currently have no notes to review

## 2010-09-25 NOTE — Assessment & Plan Note (Signed)
The patient will be referred for a CTA of the chest to rule out chronic thromboembolic disease. He reports to me that his INR levels have been very erratic. He has a prior history of pulmonary embolism on 2 occasions in DVT on another occasion. If the CT scan is negative for chronic thromboembolic disease then we will refer the patient for a left and right heart catheterization, mainly to evaluate right heart pressures and etiology of this problem but also to further evaluate his valvular heart disease which includes aortic and mitral regurgitation. Of note is that the patient also has mild to moderate tricuspid regurgitation.

## 2010-09-27 ENCOUNTER — Other Ambulatory Visit: Payer: Self-pay | Admitting: Cardiology

## 2010-09-27 DIAGNOSIS — Z86711 Personal history of pulmonary embolism: Secondary | ICD-10-CM

## 2010-09-27 DIAGNOSIS — R0602 Shortness of breath: Secondary | ICD-10-CM

## 2010-09-27 LAB — PROTIME-INR

## 2010-10-04 ENCOUNTER — Inpatient Hospital Stay (HOSPITAL_BASED_OUTPATIENT_CLINIC_OR_DEPARTMENT_OTHER)
Admission: RE | Admit: 2010-10-04 | Discharge: 2010-10-04 | Disposition: A | Discharge status: Home / Self Care | Payer: Medicare Other | Source: Ambulatory Visit | Attending: Internal Medicine | Admitting: Internal Medicine

## 2010-10-04 DIAGNOSIS — R0609 Other forms of dyspnea: Secondary | ICD-10-CM

## 2010-10-04 DIAGNOSIS — R0989 Other specified symptoms and signs involving the circulatory and respiratory systems: Secondary | ICD-10-CM

## 2010-10-04 DIAGNOSIS — Z86711 Personal history of pulmonary embolism: Secondary | ICD-10-CM | POA: Insufficient documentation

## 2010-10-04 DIAGNOSIS — I428 Other cardiomyopathies: Secondary | ICD-10-CM | POA: Insufficient documentation

## 2010-10-04 LAB — POCT I-STAT 3, ART BLOOD GAS (G3+)
Acid-base deficit: 1 mmol/L (ref 0.0–2.0)
pO2, Arterial: 73 mmHg — ABNORMAL LOW (ref 80.0–100.0)

## 2010-10-04 LAB — POCT I-STAT 3, VENOUS BLOOD GAS (G3P V)
Bicarbonate: 24.7 mEq/L — ABNORMAL HIGH (ref 20.0–24.0)
O2 Saturation: 69 %
O2 Saturation: 69 %
TCO2: 26 mmol/L (ref 0–100)
pCO2, Ven: 46.1 mmHg (ref 45.0–50.0)
pCO2, Ven: 47.2 mmHg (ref 45.0–50.0)
pO2, Ven: 38 mmHg (ref 30.0–45.0)
pO2, Ven: 39 mmHg (ref 30.0–45.0)

## 2010-10-08 ENCOUNTER — Ambulatory Visit: Payer: Medicare Other | Admitting: Cardiology

## 2010-10-08 NOTE — Assessment & Plan Note (Signed)
[facility]                          [facility]   NAME:[name], [name]                        MRN:          [id number]  DATE:12/25/2008                            DOB:          [birth year]    REFERRING PHYSICIAN:  Kirstie Peri, MD   HISTORY OF PRESENT ILLNESS:  The patient is a 77 year old male with  history of her chronic dyspnea.  The patient predominantly has dyspnea  secondary to chronic obstructive pulmonary disease and will be seeing  [name] [name].  The patient denies any chest pain.  He has no orthopnea  or PND.  He does have significant desaturation on exertion.   MEDICATIONS:  1. Carvedilol 25 mg b.i.d.  2. Warfarin 5 mg as directed by [name] [name].  3. Multivitamin.  4. Simvastatin 80 mg half a tablet 1-2 times a week.   PHYSICAL EXAMINATION:  VITAL SIGNS:  Blood pressure 119/78, heart rate  76, weight is 185 pounds.  GENERAL:  Well-nourished African American male in no apparent distress.  HEENT:  Pupils, eyes are equal.  Conjunctivae clear.  NECK:  Supple, normal carotid upstroke, and no carotid bruits.  LUNGS:  Diminished breath sounds bilaterally.  HEART:  Regular rate and rhythm.  Normal S1 and S2.  No murmur, rubs, or  gallops.  ABDOMEN:  Soft.  EXTREMITIES:  No cyanosis, clubbing, or edema.  NEURO:  The patient alert, oriented, and grossly nonfocal.   PROBLEM LIST:  1. Chronic exertional dyspnea secondary to chronic obstructive      pulmonary disease.  2. History of pulmonary embolism on chronic Coumadin therapy.  3. Mild nonischemic cardiomyopathy with ejection fraction 45-50%.  4. History of complete heart block.      a.     Status post permanent pacemaker implantation of CRT device       and defibrillator upgrade in 2008.  5. Lisinopril causes angioedema.  6. Tobacco use.   PLAN:  1. The patient will follow up with [name] [name] for his pulmonary      disease which is contributing      to his dyspnea.  2.  The patient from a cardiac standpoint is stable and we will see him      back in 6 months.     [name] Codding, MD,FACC  Electronically Signed    GED/MedQ  DD: 12/25/2008  DT: 12/26/2008  Job #: [id number]   cc:   [name] Peri, MD

## 2010-10-08 NOTE — Assessment & Plan Note (Signed)
[facility]                          [facility]   NAME:[name] [name], [name] [name]                        MRN:          [id number]  DATE:06/19/2008                            DOB:          01-07-1928    REFERRING PHYSICIAN:  Kirstie Peri, MD   HISTORY OF PRESENT ILLNESS:  The patient is a 77 year old male with a  history of chronic dyspnea.  The patient was initially thought to have  pulmonary hypertension by right heart catheterization, had no  significant pulmonary hypertension.  He has no history of coronary  artery disease by catheterization in 2002, and a Cardiolite study more  recently was negative for ischemia.  The patient underwent CPX testing  which demonstrated severe exercise limitation due to COPD.  The patient  previously also on exertion had desaturations into the 85% range.   The patient states that however, he is doing recently well.  He has no  wheezing.  He uses Combivent inhaler p.r.n.  He denies any substernal  chest pain, palpitations, or syncope.   MEDICATIONS:  1. Carvedilol 25 mg p.o. b.i.d.  2. Warfarin as directed.  3. Multivitamin.  4. Simvastatin 80 mg half-a-tablet 1-2 times a week.   PHYSICAL EXAMINATION:  VITAL SIGNS:  Blood pressure 116/66, heart rate  is 83, and weight is 188 pounds.  NECK:  Normal carotid upstroke and no carotid bruits.  LUNGS:  Somewhat diminished breath sounds, but otherwise clear.  HEART:  Regular rate and rhythm.  Normal S1 and S2.  No murmur, rubs, or  gallops.  ABDOMEN:  Soft and nontender.  No rebound or guarding.  Good bowel  sounds.  EXTREMITIES:  No cyanosis, clubbing, or edema.   PROBLEM LIST:  1. Chronic exertional dyspnea secondary to chronic obstructive      pulmonary disease.  2. History of pulmonary embolism, on chronic Coumadin therapy.  3. Mild nonischemic cardiomyopathy, ejection fraction of 45-50%.  4. History of complete heart block, status post permanent  pacemaker      implantation of St. Jude cardiac resynchronization therapy device      and defibrillator upgrade in April 2008.  5. LISINOPRIL allergy angioedema.  6. Ongoing tobacco use.   PLAN:  1. I have given the patient oral steroid inhaler for his COPD.  2. The patient continues his Combivent.  From cardiac standpoint,      however, stable and I made no changes in his medical regimen.     Learta Codding, MD,FACC  Electronically Signed    GED/MedQ  DD: 06/19/2008  DT: 06/20/2008  Job #: [id number]   cc:   Kirstie Peri, MD

## 2010-10-08 NOTE — Assessment & Plan Note (Signed)
[facility]                          [facility]   NAME:Jagoda, ISACC TURNEY                        MRN:          [id number]  DATE:10/07/2006                            DOB:          1928-04-29    Mr. Zhen returns today for a three week follow-up status post  explantation of existing pacemaker which is at end of life with  implantation of a St. Jude Frontier II DDDRV CRT-P device by Dr. Berton Mount on September 15, 2006.  Note, this is patient's first post pacemaker  check after implantation of device.  He will need wound check at pacer  clinic in [place], [place], also.  Patient had blood work  drawn on Sep 30, 2006.  BUN and creatinine stable at 22 and 1.1,  potassium 4.1.  Mr. Life denies any problems associated with his  device.  He does complain of dyspnea which is ongoing for him, and a  nonproductive cough with a known history of COPD.  Previous tobacco  user.  He denies any fever or chills.  No swelling around device site,  any lightheadedness, dizziness, presyncope, syncopal episodes,  orthopnea, PND or device firing.  He is not aware of any palpitations or  any chest discomfort.   PAST MEDICAL HISTORY:  1. Congestive heart failure, class III, chronic systolic.  In a      setting of nonischemic cardiomyopathy with EF in the 30s.  2. History of pacemaker implantation secondary to complete heart      block.  3. History of GI intolerance to aspirin.  4. Allergy to Alpha blockers and possible ACE inhibitors.  5. Anticoagulation therapy.  6. Status post cardiac catheterization 2002 showing nonobstructive      coronary artery disease.  7. Chronic dyspnea.  8. History of obstructive peripheral vascular disease.  9. COPD with history of tobacco use.  10.Status post CPX testing, patient states he had one done recently,      the results of which are not available in his chart at this time.   REVIEW OF SYSTEMS:  As stated  above.   MEDICATIONS:  1. Digoxin 0.125 mg daily.  2. Simvastatin 40 mg daily.  3. Warfarin as directed by Dr. Eliberto Ivory.  4. Atenolol 50 mg daily.  5. Patient has listed furosemide 40 mg p.o. daily, however, patient      states that he only takes his medication p.r.n. when he feels he      needs it.  6. He also is taking an allergy pill p.r.n.   ALLERGIES:  ALPHA BLOCKERS.  Questionable intolerance to ACE INHIBITORS  and gastrointestinal intolerance to ASPIRIN.   PHYSICAL EXAMINATION:  GENERAL APPEARANCE:  Mr. Wolden is in no acute  distress.  He is a very pleasant 77 year old African American gentleman.  VITAL SIGNS:  Weight 188 pounds, blood pressure 137/85 with a heart rate  of 73.  NECK:  No JVD at this time.  LUNGS:  He has scattered rhonchi bilateral upper lobes with a  nonproductive cough.  CARDIOVASCULAR:  S1 and S2.  Device site to  left chest, Steri-Strips dry  and intact.  No palpable hematoma.  Skin is warm and dry.  ABDOMEN:  Soft, nontender, positive bowel sounds.  EXTREMITIES:  Lower extremities without clubbing, cyanosis, or edema.  NEUROLOGIC:  Patient alert and oriented x3, up ambulating without  assistance.   IMPRESSION:  1. Status post recent upgrade to a CRT device.  Wound site stable at      this time.  2. History of congestive heart failure secondary to nonischemic      cardiomyopathy, no signs of volume overload at this time.   Will continue current medications.  Patient needs to follow up for wound  check at the [place], [place], office.  Will arrange this and  then have patient follow up with Dr. Graciela Husbands as already scheduled for  January 05, 2007, at 12:20.  Patient's primary cardiologist needs  clarification.  Apparently he has seen Dr. Andee Lineman here but patient  states he has been going to the [facility] office and has listed Dr.  Jens Som as his previous primary cardiologist and Dr. Graciela Husbands as  electrophysiologist and Dr. Weyman Pedro as  primary care physician.  Patient states that he would like to just follow up at the [place],  [place], office all the time now.      Dorian Pod, ACNP       Learta Codding, MD,FACC    MB/MedQ  DD: 10/07/2006  DT: 10/07/2006  Job #: [id number]

## 2010-10-08 NOTE — Cardiovascular Report (Signed)
NAMELOGAN, [place] NO.:  [id number]   MEDICAL RECORD NO.:  [id number]          PATIENT TYPE:  OIB   LOCATION:  2856                         FACILITY:  [facility]   PHYSICIAN:  Rollene Rotunda, MD, FACCDATE OF BIRTH:  07-17-1927   DATE OF PROCEDURE:  04/21/2008  DATE OF DISCHARGE:  04/21/2008                            CARDIAC CATHETERIZATION   PRIMARY:  Kirstie Peri, MD   CARDIOLOGIST:  Learta Codding, MD, [facility]   PROCEDURE:  Right heart catheterization.   INDICATIONS:  Evaluate patient with progressive dyspnea.  He does have a  known cardiomyopathy with CRT placement.   PROCEDURE NOTE:  Right heart catheterization performed via the right  femoral vein.  The vessel was cannulated using anterior wall puncture.  A #7-French venous sheath was inserted via the modified Seldinger  technique.  A Swan-Ganz catheter was utilized.  The patient tolerated  the procedure well and left the lab in stable addition.   RESULTS:  Hemodynamics:  RA mean 2, RV 40/3, PA 39/10 with a mean of 22,  pulmonary capillary wedge pressure mean 4, cardiac output/ cardiac index  4.6/2.43.   CONCLUSION:  Normal right heart pressures.  Mildly reduced cardiac  output/cardiac index.   PLAN:  The patient will continue to have medical management of his  cardiomyopathy and further evaluation of his dyspnea per Dr. Andee Lineman.      Rollene Rotunda, MD, [facility]  Electronically Signed     JH/MEDQ  D:  04/21/2008  T:  04/22/2008  Job:  [id number]   cc:   Learta Codding, MD,FACC  Kirstie Peri, MD

## 2010-10-08 NOTE — Cardiovascular Report (Signed)
[facility]                   [facility]   NAME:Pilot, JUPITER KABIR                        MRN:          [id number]  DATE:08/13/2007                            DOB:          1928/01/05    Mr. Mikami is seen following CRT-P implantation.  He has a complete heart  block and ischemic heart disease and class III congestive heart failure.  He underwent AV optimization echo which was unfortunately not associated  with significant improvement.   His medications are notable for him taking atenolol.  He takes digoxin.  He does not take an ACE inhibitor because of angioedema and hence also  not an ARB.   PHYSICAL EXAMINATION:  GENERAL:  On examination, he is in no acute  distress.  VITAL SIGNS:  His blood pressure is 133/77 with a pulse of 77.  His  weight was 189.  LUNGS:  Clear.  CARDIAC:  Neck veins were 6.  Heart sounds were regular.  SKIN:  Warm and dry.  EXTREMITIES:  Without edema.   Interrogation of his St. Jude pulse generator demonstrates a P wave of  1.75 with impedance of 339, a threshold of 0.5 and a 0.5. There was no  intrinsic ventricular rhythm.  The RV impedance was 470, as was the LV  impedance.  The threshold of 1 volt at 0.5 in both chambers.   IMPRESSION:  1. Ischemic cardiomyopathy.  2. Congestive failure -- chronic -- systolic -- class III.  3. Complete heart block.  4. Status post CRT-P with recent atrioventricular optimization      echocardiogram, the results of which are not available, but the      done by Dr. Andee Lineman.   We will plan to see Mr. Piech in 6 months' time.  Unfortunately, there  is not really much else that I can think that we can add.     Duke Salvia, MD, [facility]  Electronically Signed    SCK/MedQ  DD: 08/13/2007  DT: 08/14/2007  Job #: [id number]

## 2010-10-08 NOTE — Assessment & Plan Note (Signed)
[facility]                         ELECTROPHYSIOLOGY OFFICE NOTE   NAME:[name], [name]                        MRN:          [id number]  DATE:01/05/2007                            DOB:          01-02-1928    Mr. [name] [name] comes in today.  He is having significant shortness of breath.  He is status post device generator replacement with an upgrade to a CRT  pacemaker about 4 or 5 months ago.  He has not had significant  improvement in his dyspnea.   His medications today are reviewed and include digoxin, atenolol.  His  lisinopril has been intercurrently discontinued because of renal  insufficiency.   EXAMINATION:  His blood pressure was 153/86, his pulse was 59.  LUNGS:  Clear.  HEART SOUNDS:  Regular.  EXTREMITIES:  Without edema.   Interrogation of his device demonstrated that he had very short AV delay  at 150/110.  We tried a variety of maneuvers and he felt much better at  225/200 with rate response on and will see how it is that he does.  He  is to see [place] again in 4 months time.   The device was reprogrammed for chronic settings.   I should note that as I review the chart further I do not see a good  explanation for why he is not on an ACE inhibitor and there is a note  from me a year ago suggesting that but I do not know what happened with  that.  We will have to keep our eye on that and consider reinitiation at  his next office followup.     Duke Salvia, MD, [facility]  Electronically Signed    SCK/MedQ  DD: 01/05/2007  DT: 01/06/2007  Job #: [id number]

## 2010-10-08 NOTE — Assessment & Plan Note (Signed)
[facility]                          [facility] OFFICE NOTE   NAME:PAYNEJohann, Santone                        MRN:          [id number]  DATE:05/03/2008                            DOB:          Feb 25, 1928    PRIMARY CARDIOLOGIST:  Learta Codding, MD, [facility]   PRIMARY ELECTRO PHYSIOLOGIST:  Duke Salvia, MD, [facility], in [place].   REASON FOR VISIT:  Postcatheterization followup.   Mr. Seipp returns to our clinic, following recent elective right heart  catheterization.  This was arranged following recent consultation by our  team here at [facility] for evaluation of persistent, exertional  dyspnea.  Mr. Dougal underwent extensive evaluation, including a CT  angiogram of the chest which was negative for pulmonary embolus.  He is  on chronic Coumadin for history of recurrent pulmonary embolus.  There  was no definite evidence of ischemia or congestive heart failure.  The  patient does have COPD and there was evidence of mild pulmonary  emphysema by CT scan.  Of note, there was concern that he had  significant pulmonary hypertension, secondary to enlargement of the  pulmonary artery.  A 2-D echocardiogram was also performed, with  calculated RVSP of 47 mmHg.  EF was 45-50%, improved from prior  references to approximately 30%.   Mr. Heyer also has history of normal coronary arteries by his only  previous catheterization in 2002.  His last stress test was an adenosine  Cardiolite in May 2007, indicating no definite ischemia with  inferorapical scar.  He carries the diagnosis of a mixed cardiomyopathy,  and has undergone upgrade of a previously placed pacemaker to a CRT-P  device.   Right heart catheterization, performed on April 21, 2008, by Dr.  Antoine Poche, indicated normal right heart pressures with mildly reduced  cardiac output/cardiac index.   Clinically, Mr. Colson continues to experience intermittent exertional  dyspnea, but only when walking.   He denies any associated chest  discomfort and, in fact, states that he has never had chest pain.  He  also denies PND, orthopnea, or lower extremity edema.   Mr. Dewalt has since resumed taking Coumadin, which is followed by Dr.  [name].  He seems to suggest that his symptoms began with adjustment of  his medication regimen, by Dr. Graciela Husbands, with initiation of Coreg.   CURRENT MEDICATIONS:  1. Coumadin 5 mg as directed.  2. Carvedilol 25 b.i.d.  3. Simvastatin 40 mg weekly.   PHYSICAL EXAMINATION:  VITAL SIGNS:  Blood pressure 118/75, pulse 69,  regular weight 191.  GENERAL:  A 77 year old male sitting upright in no distress.  HEENT:  Normocephalic, atraumatic.  NECK:  Palpable carotid pulses without bruits; no JVD.  LUNGS:  Clear to auscultation in all fields.  HEART:  Regular rate and rhythm.  No significant murmurs.  ABDOMEN:  Benign.  EXTREMITIES:  Right groin stable with no ecchymosis, hematoma, or bruit  and auscultation; intact femoral pulse.  Diminished peripheral pulses  with trace pedal edema.  NEUROLOGIC:  No focal deficit.   IMPRESSION:  1. Persistent exertional dyspnea.  a.     No associated chest discomfort.      b.     Normal right heart pressures by recent right heart       catheterization.      c.     Normal coronary arteries by catheterization in 2002.      d.     Non-ischemic adenosine Cardiolite; EF 39%, May 2007.  2. Recurrent pulmonary embolus.      a.     Negative by recent CT angiogram.      b.     Chronic Coumadin.  3. Mixed cardiomyopathy.      a.     Improved LVF (45-50%) by current echo.      b.     Prior history of EF 30%.  4. History of complete heart block.      a.     Status post permanent pacemaker implantation.      b.     Status post St. Jude CRT-P upgrade, April 2008.  5. COPD/history of tobacco.  6. LISINOPRIL allergy (angioedema).   PLAN:  We will schedule an exercise stress Cardiolite to exclude occult  ischemia.  The patient is to  take his morning medications on day of  scheduled procedure.  If this study is negative for any definite  evidence of reversibility, then we will consider further evaluation with  CPX testing.  Of note, there is a reference in his chart to his having  had this done in the past.  Those results are currently unavailable,  however, and we will pursue this further when he returns to our clinic  in 1 month.     Rozell Searing, PA-C  Electronically Signed      Jonelle Sidle, MD  Electronically Signed   GS/MedQ  DD: 05/03/2008  DT: 05/04/2008  Job #: [id number]   cc:   Kirstie Peri, MD

## 2010-10-08 NOTE — Cardiovascular Report (Signed)
[facility]                   [facility] DEVICE CLINIC NOTE   NAME:[name] [name]                        MRN:          [id number]  DATE:02/18/2008                            DOB:          [birth year]    [name] [name] is seen in followup for congestive heart failure in the  setting of ischemic and nonischemic cardiomyopathy.  He is status post  CRT-P implantation.  He continues to have significant limiting shortness  of breath.   He has got no significant edema.  He has had no problems with chest  pain.   His medications are notable for taking both atenolol and Coreg and so we  will plan to have him discontinue his atenolol and we will double his  Coreg to 6.25 from 3.125 taken twice daily.  In addition, he also takes  Lanoxin, simvastatin, and warfarin.   On examination, his blood pressure was 154/86 and pulse is 77.  His  lungs were clear.  His heart sounds were regular.  The extremities were  without edema.   Interrogation of St. Jude CRT-P demonstrated a P-wave of 1.5 with  impedance of 336 at threshold of 1 volts at 0.5.  There was no intrinsic  ventricular rhythm.  The RV impedance of 450, the LV was 435, threshold  0.75 and 0.5 in both chambers.  There is no intercurrent episodes.   IMPRESSION:  1. Ischemic and nonischemic cardiomyopathy.  2. Chronic systolic heart failure.  3. Chronic dyspnea related partial chronic obstructive pulmonary      disease.  4. Status post CRT-P for the above.   Mr. [name] [name] is stable.  As noted, we will discontinue his atenolol and  increase his Coreg.  We will see him again in 6 months' time.     Duke Salvia, MD, [facility]  Electronically Signed    SCK/MedQ  DD: 02/18/2008  DT: 02/19/2008  Job #: [id number]   cc:   Kirstie Peri, MD

## 2010-10-08 NOTE — Assessment & Plan Note (Signed)
[facility]                         ELECTROPHYSIOLOGY OFFICE NOTE   NAME:Zapien, YOSGART PAVEY                        MRN:          [id number]  DATE:10/14/2006                            DOB:          [birth year]    Mr. [name] [name] was seen today in the device clinic for follow up of his newly  upgraded St. Jude Frontier pacemaker implanted on September 15, 2006, for  congestive heart failure and ischemic cardiomyopathy.  Interrogation of  his device demonstrates P waves of 2.0 millivolts with an atrial  impedance of 334 ohms and a threshold of 0.5 volts at 0.5 milliseconds.  His RV amplitude was not able to be obtained secondary to pacemaker  dependency to a rate of 30.  His RV impedance was 467 ohms with a  threshold of 0.5 volts at 0.5 milliseconds.  His LV impedance was 445  ohms with a threshold of 0.5 volts at 0.5 milliseconds.  His battery  voltage was 2.78 volts.  Mr. Wiedemann had no mode switch or ventricular  high rate episodes since implant.  He is programmed DDD with a low rate  of 60 and an upper rate of 110.  He is V-pacing greater than 99% of the  time.  Mr. Mazzoni Steri-Strips were removed today and his wounds were  without redness or swelling.  He has a return office visit appointment  on January 05, 2007, with Dr. Graciela Husbands in the device clinic.      Gypsy Balsam, RN,BSN  Electronically Signed      Duke Salvia, MD, [facility]  Electronically Signed   AS/MedQ  DD: 10/14/2006  DT: 10/14/2006  Job #: [id number]

## 2010-10-08 NOTE — Assessment & Plan Note (Signed)
[facility]                         ELECTROPHYSIOLOGY OFFICE NOTE   NAME:[name], [name] RANSOM                        MRN:          [id number]  DATE:02/19/2007                            DOB:          August 21, 1927    [name] [name] comes in today.  He is feeling alright.  His initial response  to CRT was quite good.  He feels like it is not nearly as good now as it  was.   His medications are not significantly changed over the last couple of  years.  They include:  1. Simvastatin 40.  2. Furosemide 40.  3. Digoxin 0.125.  4. Atenolol 50.  5. Coumadin.  He is not taking aspirin or ACE inhibitors.   PHYSICAL EXAMINATION:  VITAL SIGNS:  His blood pressure is 143/61.  His  pulse was 60.  His weight was 192.  LUNGS:  Clear.  HEART:  Sounds were regular.  EXTREMITIES:  Without edema.   Interrogation of his St. Jude Frontier CRT-P demonstrated an atrial  impedance of 342 with a threshold of 0.5 at 0.5.  There was no intrinsic  ventricular rhythm.  The RV impedance was 505 with a threshold of 0.75  at 0.5.  The LV impedance was 460 with a threshold of 0.5 at 0.5.  Battery voltage was 2.81.  Rate response was activated and the slope was  changed from 1 to 2.  As he has no intrinsic rhythm, AV optimization  cannot be done via the device.   We have set him up to go to [facility] for an AV optimization echo that can be  done in conjunction with Dr. Andee Lineman and Jana Half or one of the other  St. Jude representatives.   We will plan to see him again in approximately 6 months' time.     Duke Salvia, MD, [facility]  Electronically Signed    SCK/MedQ  DD: 02/19/2007  DT: 02/19/2007  Job #: [id number]   cc:   Kirstie Peri, MD

## 2010-10-11 NOTE — Cardiovascular Report (Signed)
[facility]  Patient:    [name] [name], [name] [name]                          MRN: [id number] Adm. Date:  [id number] Disc. Date: [id number] Attending:  Veneda Melter CC:         Weyman Pedro, M.D.  Madolyn Frieze Jens Som, M.D. [facility]   Cardiac Catheterization  DATE OF BIRTH:  Dec 01, 1927.  REFERRING PHYSICIAN:  Weyman Pedro, M.D.  CARDIOLOGIST:  Madolyn Frieze. Jens Som, M.D. [facility]  PROCEDURE PERFORMED: 1. Left heart catheterization with selective coronary angiography . 2. Ventriculography.  HISTORY OF PRESENT ILLNESS:  The patient is a 77 year old male referred for diagnostic catheterization to assess his coronary anatomy.  DIAGNOSIS: 1. No flow-limiting coronary artery disease. 2. Elevated left ventricular end diastolic pressure.  DESCRIPTION OF PROCEDURE:  After informed consent was obtained, the patient was brought to the catheterization laboratory.  A 6-French right catheter was placed using the modified Seldinger technique after adequate local anesthesia. Subsequently, the 6-French JL4 and JR4 catheters were used to engage the left and right coronary systems.  Selective coronary angiography was performed through various projections using manual injections of contrast.  After selective coronary angiography, ventriculography was performed with a 6-French angled pigtail catheter.  A single plane RAO projection for left ventriculography and hemodynamic pressures were obtained.  Subsequently, at the end of the procedure, the catheters were removed.  No complications were noted.  The patient was transferred back to the holding area, and adequate hemostasis was provided.  FINDINGS: 1. Left main:  No flow-limiting coronary artery disease. 2. Left anterior descending artery:  Large caliber vessel with no evidence    of flow-limiting coronary artery disease. 3. Left circumflex coronary artery:  No flow-limiting coronary artery disease. 4. Right coronary artery:  No  evidence of flow-limiting coronary artery    disease.  HEMODYNAMICS:  Left ventricular pressure 150/23.  Aortic pressure 151/83 mmHg. Ejection fraction was 40%.  IMPRESSION: 1. No flow-limiting coronary artery disease. 2. Mildly decreased LV systolic function with elevated left ventricular end    diastolic pressure.  RECOMMENDATIONS:  Continue aggressive risk factor modification.  The patient can be started on ACE inhibitor with BMET in one week. DD:  06/04/00 TD:  06/04/00 Job: [id number]

## 2010-10-11 NOTE — Letter (Signed)
April 07, 2006    [name] [name]  [address], [place]  [address]   RE:  DEX, BLAKELY  MRN:  [id number]  /  DOB:  1928-03-17   Dear Rosanne Ashing:   Mr. Sage comes back in today with the same complaints of dyspnea on  exertion.  He also has cramping in his lower extremities,  particularly in  his calves, which we have evaluated.  Apparently, he did not have  significant obstructive peripheral vascular disease.  He also describes  spells of lightheadedness, one which occurred while sitting at the eye  doctor, that was relieved by moving forward or back in his chair.   Evaluation of his dyspnea have included an ultrasound of his heart about a  year or so ago with an ejection fraction that was up a little bit in the 40-  45% range, read by Dr. Charyl Dancer.  he has not had a recent Myoview scan,  although he had a negative catheterization a number of years ago.   I should note that he is 100% ventricularly paced, and this may be  contributing.   His medications are reviewed and are unchanged.   PHYSICAL EXAMINATION:  VITAL SIGNS:  On examination, his blood pressure  today is 144/76 with a pulse of 52.  LUNGS:  Clear.  HEART:  Sounds were regular.  EXTREMITIES:  Without edema.   Interrogation of his [employer] Trilogy pulse generator demonstrates that  his battery voltage is 2.62 with a 9 kilo Ohm cell impedence, with an  estimated longevity of 5 months.  He has no intrinsic ventricular rhythm.  His P-wave is 2.  His impedence is 329.  Threshold at the atrium is 1 volt  of 0.4, which is also the RV threshold, the impedence of which is 526.   IMPRESSION:  1. Complete heart block.  2. Status post pacer for the above.  3. Ischemic heart disease with an ejection fraction of about 40 or 45%.  4. Lower extremity discomfort with walking without evidence of obstructive      peripheral disease.  5. Dyspnea, question mechanism.   Rosanne Ashing, as Mr. Gerke gets closer to his device change out, I would  like to plan  to get a Myoview scan to assess both his LV function and to be sure there is  no ischemia.  This might give [place] better ideas as to what kind of device to  implant, and also might give [place] some ideas to whether cardiac  resynchronization may be of some benefit for him with his 100% paced rhythm.   Will see him again in 4 months in [place].    Sincerely,      Duke Salvia, MD, [facility]  Electronically Signed    SCK/MedQ  DD: 04/07/2006  DT: 04/07/2006  Job #: [id number]

## 2010-10-11 NOTE — Op Note (Signed)
NAMECALLUM, WOLF NO.:  [id number]   MEDICAL RECORD NO.:  [id number]          PATIENT TYPE:  INP   LOCATION:  2853                         FACILITY:  [facility]   PHYSICIAN:  Duke Salvia, MD, FACCDATE OF BIRTH:  August 11, 1927   DATE OF PROCEDURE:  09/15/2006  DATE OF DISCHARGE:                               OPERATIVE REPORT   PREOPERATIVE DIAGNOSES:  1. Previously implanted pacemaker.  2. Complete heart block.  3. Congestive failure, class III, ejection fraction of about 35-40%.   POSTOPERATIVE DIAGNOSES:  1. Previously implanted pacemaker.  2. Complete heart block.  3. Congestive failure, class III, ejection fraction of about 35-40%.   PROCEDURES:  1. Contrast venogram.  2. Upgrade of his pacemaker to a cardiac resynchronization pacemaker.   Following the obtaining of informed consent, the patient was brought to  the electrophysiology laboratory and placed on the fluoroscopic table in  the supine position.  After routine prep and drape, a the left upper  extremity venogram was undertaken to demonstrate the patency and the  course of the extrathoracic left subclavian vein.  Lidocaine was then  infiltrated along the line of the previous incision and was carried down  to the layer of the prepectoral fascia using sharp dissection.  We then  obtained access to the extrathoracic left subclavian vein without  difficulty.  A guidewire was placed, a 9-French sheath was placed, and  through this was passed a Medtronic MB-2 coronary sinus cannulation  catheter.  In conjunction with a Wholey wire, the coronary sinus was  rapidly cannulated.  In fact, there was subselection of a lateral  branch.  This was quite small.  We used a Whisper wire to cannulate this  branch but even the Whisper wire could not pass beyond the junction of  the proximal and mid third.  Because of that, it was elected to explore  with a wire other lateral branches.  A very high lateral branch  was  identified.  A wire was placed but I was unable to pass the Guidant  Easytrack II, which I had tried before, because of the smallness of the  branch here as well.  At this point, the contrast venograms were taken  and a lateral branch was seen that was quite large.  In fact, it was  probably too large for the Guidant lead.  We were able to conjunction  with a Medtronic Attain I subsection catheter pass the wire down.  I was  unable, however, to pass the Guidant Easytrack lead over the wire past  the junction of the vein branch with the body of the coronary sinus.  We  then used a Medtronic Attain II subselection catheter system.  This,  unfortunately, resulted in prolapse of the wire as the dilator sheath  itself was not able to pass again beyond the stenosis.  I suspect this  had to do with the angle of the wire across the ostium.   We then took the Attain II selection sheath again and using it in  conjunction with the Whisper wire cannulated this lateral  branch  sufficiently so that we could then pass the Medtronic 4193 unipolar LV  lead, serial number [id number].  In its distal ramifications there was  diaphragmatic stimulation in the mid third.  There was no diaphragmatic  stimulation at 10 V.  The pacing threshold was 0.4 V at 0.5 msec with  the impedance of 553 ohms.   At this point the delivery system was removed.  We had a little bit of  difficulty with the fracturing of the head of the subselection catheter.  We were, however, able to withdraw all the delivery systems with stable  lead position.  At this point the pocket was opened up.  The previously-  implanted pacemaker was exposed and explanted.  The previously-implanted  atrial lead was a St. Jude 1377 TC, serial number [id number].  The P-wave  was 2.5 with a pace impedance of 372 and a threshold of 1 V at 0.5.   The previously-implanted Medtronic RV lead was a [id number], serial number  [id number].  There was no intrinsic  ventricular rhythm.  The impedance  was 583 and the threshold was less than 1 V at 0.5 msec.  At this point  the system was implanted.  The LV lead was secured to the prepectoral  fascia.  The pocket was expanded to allow for the coursing of the LV  lead.  The leads were attached to a St. Jude Frontier II [id number] CRT  pacemaker, serial number [id number].  RV pacing, P-synchronous pacing, and  then P-synchronous bi-V were identified in sequence.  The pocket was  copiously irrigated with antibiotic-containing saline solution.  Hemostasis was assured and the leads and the pulse generator were placed  in the pocket and secured to the prepectoral fascia.  The wound was  closed in three layers in the normal fashion.  The wound was washed and  dried and a benzoin and Steri-Strip dressing was applied.  Needle  counts, sponge counts and instrument counts were correct at the end of  procedure according to the staff.  The patient tolerated the procedure  without apparent complication.      Duke Salvia, MD, [facility]  Electronically Signed     SCK/MEDQ  D:  09/15/2006  T:  09/15/2006  Job:  [id number]

## 2010-10-11 NOTE — H&P (Signed)
[facility] ADMISSION   NAME:[name] [name] BEHAN                        MRN:          [id number]  DATE:09/11/2006                            DOB:          03/20/1928    [name] [name] is a 77 year old gentleman previously implanted with a  pacesetter pulse generator for a complete heart block, now at end of  life.   He has a nonischemic cardiomyopathy with moderate shortness of breath  with an ejection fraction of about 30-35% or so.  He has some  orthostatic dizziness which has been long-standing.   He describes spells also where he feels like he is about to pass out  after he stands or even while he is just sitting looking straight ahead.   MEDICATIONS:  1. Digoxin 0.125.  2. Simvastatin 40.  3. Furosemide 40.  4. Warfarin.  5. Atenolol 50.   HE IS ALLERGIC TO ALPHA BLOCKERS.   PHYSICAL EXAMINATION:  VITAL SIGNS:  His blood pressure is 312/62.  His  pulse is 64.  LUNGS:  Clear.  HEART:  Sounds were regular.  EXTREMITIES:  Without edema.   Interrogation of his St. Jude Pulse Generator demonstrated battery  voltage of 2.48 which is way past ERI, closely approaching EOL.  His T  wave was 2, impedance was 364, threshold of both chambers was 1 volt at  0.4, impedance was 490.  The device had no reverted.  He is device  dependent.   IMPRESSION:  1. Complete heart block and device dependence.  2. Status post pacer for the above.  3. Nonischemic cardiomyopathy.      a.     Ejection fraction 35%.      b.     At 100% ventricular pacing.      c.     Class III congestive failure.   [name] [name] has reached ERI, approaching EOL.  We will plan to undertake  his generator replacement next week.  We will plan to place a left  ventricular lead at that time.  We have  discussed potential benefits as well as potential risks including, but  not limited to, the risk of infection.  He understands these risks and  is willing to  proceed.     Duke Salvia, MD, [facility]  Electronically Signed    SCK/MedQ  DD: 09/11/2006  DT: 09/11/2006  Job #: [id number]

## 2010-10-11 NOTE — Discharge Summary (Signed)
[name] [name], [name] [name]                 ACCOUNT NO.:  [id number]   MEDICAL RECORD NO.:  [id number]          PATIENT TYPE:  INP   LOCATION:  3703                         FACILITY:  [facility]   PHYSICIAN:  Duke Salvia, MD, FACCDATE OF BIRTH:  08-10-1927   DATE OF ADMISSION:  09/15/2006  DATE OF DISCHARGE:  09/16/2006                               DISCHARGE SUMMARY   TIME FOR THIS DICTATION:  Greater than 25 minutes.   PRINCIPAL DIAGNOSES:  1. Discharging day one status post pacemaker upgrade to a CRT-P.  2. Complete heart block.   SECONDARY DIAGNOSES:  1. Class III chronic systolic congestive heart failure.  2. Ischemic versus nonischemic cardiomyopathy, ejection fraction in      the mid 30s.  3. History of pacemaker implantation for complete heart block.   PROCEDURE:  September 15, 2006, explantation of existing pacemaker which is  at end of life, with implantation of a St. Jude Frontier II DDDRV CRT-P,  Dr. Sherryl Manges.  The patient has had no postprocedural complications.   BRIEF HISTORY:  [name] [name] is a 77 year old male.  He has a pacemaker  that was implanted for complete heart block and has reached elective  replacement indicator.  He is device dependent.  He has cardiomyopathy,  his ejection fraction is in the mid 30s.  He is significantly short of  breath walking 100-200 feet.  He has class III chronic systolic  congestive heart failure.  He has a GI intolerance for aspirin.  He is  allergic alpha blockers and possibly ACE inhibitors.  The patient will  present electively for pacemaker generator change with upgrade to  cardiac resynchronization therapy.   HOSPITAL COURSE:  The patient presented electively to [facility] September 15, 2006.  His existing pacemaker was explanted and a St.  Jude Frontier II pacemaker was implanted with successful implantation  also of cardiac resynchronization lead.  The patient's follow-up chest x-  ray shows satisfactory implant of the  lead.  The device has been  interrogated on postprocedure day #1, all values within normal limits.  Patient has had no postprocedural complications and no complain of pain.  Chest x-ray shows no pneumothorax.  Mobility of the left arm has been  discussed with the patient as well as incision care.   He continues on his medications that he has been on  1. Digoxin 0.125 mg daily.  2. Simvastatin 40 mg daily at bedtime.  3. Lasix 40 mg daily.  4. Coumadin as before this procedure.  5. Atenolol 50 mg daily.   He has follow-up in [place], [place], Office of [facility]  for blood work at the [employer] which is behind [place].  Our  office in [place] will call with the date and time for an appointment.  His  office visit in [place], [place],  is Oct 07, 2006, which is a  Wednesday at 9 o'clock.  He is to see Dr. Graciela Husbands in the [place], [place], office Tuesday, January 05, 2007, at 12:20.   LABORATORY STUDIES PERTINENT TO THIS ADMISSION:  Hemoglobin 13,  hematocrit 39.2, white cells 5.7, platelets 174.  Serum electrolytes:  Sodium 140, potassium 4.1, chloride 108, carbonate 28, BUN is 18,  creatinine 0.91, glucose of 98.  Protime on day of discharge, September 16, 2006, 16.4, INR 1.3.      Maple Mirza, [place]      Duke Salvia, MD, [facility]  Electronically Signed    GM/MEDQ  D:  09/16/2006  T:  09/16/2006  Job:  [id number]   cc:   [facility] [place]

## 2010-10-11 NOTE — Letter (Signed)
January 23, 2006     Dr. Winn Jock. Eliberto Ivory  [address]  [place], [place] [address]   RE:  TIGHE, GITTO  MRN:  [id number]  /  DOB:  [birth year]   Dear Rosanne Ashing:   It was a pleasure to see [name] [name] at your request regarding his dyspnea  and chest and dizziness.  As you know, he has ischemic heart disease and  recently underwent Cardiolite scanning as an issue related to his dyspnea  which showed an EF of 39% and no ischemia.  He continues to have problems  with dizziness which is primarily orthostatic.  As he tells the story, it is  my impression that there is some connection between this and his dyspnea.  His dyspnea is unusual in that sometimes he says he can go 6-8 hours without  difficulty and other times be short of breath walking across the room.  It  is not clear that this is locale specific either.   His medications are unchanged except for the current addition of albuterol.  He takes atenolol.   PHYSICAL EXAMINATION:  VITAL SIGNS:  His blood pressure is 147/77 with a  pulse of 67, and orthostatics are pending.  LUNGS:  Clear.  HEART:  Sounds were regular without murmurs or gallops.  EXTREMITIES:  Without edema.   Interrogation of his St. Jude Trilogy Pulse generator demonstrates a P wave  of 1 with impedance of 351, a threshold of 1 volt at 0.4.  In both chambers  the RV impedance was 530 and there was no intrinsic ventricular rhythm.  Battery voltage was 2.69 with an estimated longevity of 7 months.   IMPRESSION:  1. Complete heart block that is pacer dependent.  2. Status post pacer for the above.  3. Ischemic heart disease with a negative recent Myoview and ejection      fraction of 39%.  4. Dizziness, likely orthostatic.  5. Dyspnea, question mechanism being sporadic.   I think, Rosanne Ashing, what I would like to do is to check his orthostatics which we  will do here today.  I also will submit him for __________  testing to see  if we can understand whether his  shortness of breath is cardiac or  pulmonary.  The intermittent nature of it suggests that it may be a  pulmonary and an exposure thing, although I cannot get a good history of  what the exposure is.   I also wonder whether it is worth switching his atenolol to a long acting  metoprolol formulation which has been studied in heart failure patients.  Let me know what your thoughts are on that.   We also should  probably start him on an ACE inhibitor which I will do  pending evaluation of his orthostatics.  He will need a B-MET about two  weeks thereafter.  The last B-MET we have here on record is almost 3 years  ago, so we will check that again today prior to initiation.    Sincerely,      Duke Salvia, MD, [facility]   SCK/MedQ  DD:  01/23/2006  DT:  01/23/2006  Job #:  [id number]

## 2010-10-11 NOTE — Cardiovascular Report (Signed)
[facility]                   [facility] DEVICE CLINIC NOTE   NAME:Welge, MUSAB WINGARD                        MRN:          [id number]  DATE:09/11/2006                            DOB:          03-29-1928    Mr. Staggs comes in today for his device followup. He has reached ERI. He  is device dependent. He has cardiomyopathy, the specifics of which are  not available, with an ejection fraction in the mid 30s. He is  significantly short of breath at walking 100-200 feet. He does not have  nocturnal dyspnea or peripheral edema.   He does not take aspirin because of GI intolerance, he is allergic ALPHA  BLOCKERS. He does take Digoxin 0.125, Simvastatin, furosemide, warfarin  and atenolol. I do not know why he is not on an ACE inhibitor.   PHYSICAL EXAMINATION:  VITAL SIGNS:  His blood pressure today was  132/62, his pulse was 75.  LUNGS:  Clear.  HEART:  Sounds were regular. Neck veins were flat.  EXTREMITIES:  Without edema.   Interrogation of his St. Jude Trilogy pulse generator demonstrates that  his battery voltage was 2.48, he is at [employer] and very close to EOL. His P  wave  was 2 with impedance of 363 and threshold of 1 volt at 0.4. There  is no intrinsic ventricular rhythm. The impedance was 490, threshold was  1 volt at 0.4.   IMPRESSION:  1. Complete heart block.  2. Status post pacer for the above.  3. Congestive heart failure.  4. Ischemic versus nonischemic cardiomyopathy with ejection fraction      in the mid 30s.   We will plan to bring Mr. Lotts in next Tuesday for device generator  upgrade to a CRT device.     Duke Salvia, MD, [facility]  Electronically Signed    SCK/MedQ  DD: 09/11/2006  DT: 09/12/2006  Job #: [id number]

## 2010-10-11 NOTE — Op Note (Signed)
[place]  Patient:    TU, BAYLE                          MRN: [id number] Proc. Date: 06/04/00 Adm. Date:  [id number] Disc. Date: [id number] Attending:  Veneda Melter CC:         Weyman Pedro, M.D.  Madolyn Frieze Jens Som, M.D. [facility]   Operative Report  DATE OF BIRTH:  June 26, 1927  REFERRING PHYSICIAN:  Weyman Pedro, M.D.  CARDIOLOGIST:  Madolyn Frieze. Jens Som, M.D.  PROCEDURES PERFORMED:  Left heart catheterization with selective coronary DD:  06/04/00 TD:  06/04/00 Job: [id number]

## 2010-10-16 NOTE — Cardiovascular Report (Signed)
[name] [name], [name] [name]                 ACCOUNT NO.:  [id number]  MEDICAL RECORD NO.:  [id number]           PATIENT TYPE:  LOCATION:                                 FACILITY:  PHYSICIAN:  Bevelyn Buckles. Renato Spellman, MDDATE OF BIRTH:  [birth year]  DATE OF PROCEDURE:  10/04/2010 DATE OF DISCHARGE:                           CARDIAC CATHETERIZATION   PRIMARY CARE PHYSICIAN:  Kirstie Peri, MD.  CARDIOLOGIST:  Learta Codding, MD, [facility].  [name] [name] is an 77 year old male with a history of nonischemic cardiomyopathy with EF of 45%-50% followed by Dr. Andee Lineman.  He has been having problems with severe dyspnea.  He had a pulmonary embolus in September 2011.  Stress test showed EF of 42% with a question of inferior scar, but no ischemia.  Echocardiogram showed evidence of possible pulmonary hypertension with a peak RVSP to 70-75 mm.  A CPX test which was limited due to exertional hypoxemia suggesting pulmonary limitation.  He is referred for right and left heart catheterization to further evaluate his dyspnea.  PROCEDURES PERFORMED: 1. Right heart cath. 2. Selective coronary angiography. 3. Left heart cath. 4. Left ventriculogram.  DESCRIPTION OF PROCEDURE:  Risks and indications were explained. Consent was signed and placed on the chart.  The right groin area was prepped and draped in routine sterile fashion.  We put a long 5-French arterial sheath in the right femoral artery due to the tortuosity of the iliac system.  Standard catheters including JL-4, 3-D RCA, and angled pigtail were used.  All catheter exchanges made over wire.  A 7-French venous sheath was placed in the right femoral vein.  Standard Swan-Ganz catheter was used for right heart cath.  Pressures, right atrial pressure mean of 5, RV pressure 39/4 with an EDP of eight.  PA pressure 41/18 with a mean of 28.  Pulmonary capillary wedge pressure mean of 11.  Fick by CK cardiac output is 5.9.  Cardiac index 3.0.  Aortic pressure is  156/73 with a mean of 106.  LV pressure 157/20 with EDP of 22.  There is no significant aortic stenosis on pullback.  Pulmonary vascular resistance was 2.9 Woods units. Saturations femoral artery with 94% PA was 69% and 69%.  Left main was normal.  LAD was a long vessel that coursed the apex, gave off a large diagonal branch, was angiographically normal.  Left circumflex gave off two marginal branches, was angiographically normal.  Right coronary artery was dominant vessel, gave off PDA and two small posterolaterals, it was angiographically normal.  Left ventriculogram showed an EF of approximately 505-55%.  There is question of mild inferior hypokinesis.  No significant mitral regurgitation was appreciated.  ASSESSMENT: 1. Normal coronary arteries. 2. Normal left ventricular function. 3. Minimally elevated pulmonary artery pressure. 4. Normal cardiac output.  PLAN:  Plans as discussion.  Based on his CTX test and cardiac catheterization, I suspect most of Mr. Mattern symptoms are related to his lung disease.  I do not see significant cardiac limitation at least at rest.  He will follow up with Dr. Andee Lineman.     Bevelyn Buckles. Algie Westry, MD  DRB/MEDQ  D:  10/04/2010  T:  10/04/2010  Job:  [id number]  cc:   Learta Codding, MD,FACC Kirstie Peri, MD  Electronically Signed by Arvilla Meres MD on 10/16/2010 09:38:34 PM

## 2010-10-24 ENCOUNTER — Ambulatory Visit (INDEPENDENT_AMBULATORY_CARE_PROVIDER_SITE_OTHER): Payer: Medicare Other | Admitting: Physician Assistant

## 2010-10-24 ENCOUNTER — Encounter: Payer: Self-pay | Admitting: Cardiology

## 2010-10-24 DIAGNOSIS — R0602 Shortness of breath: Secondary | ICD-10-CM

## 2010-10-24 DIAGNOSIS — I429 Cardiomyopathy, unspecified: Secondary | ICD-10-CM

## 2010-10-24 DIAGNOSIS — Z95810 Presence of automatic (implantable) cardiac defibrillator: Secondary | ICD-10-CM

## 2010-10-24 NOTE — Assessment & Plan Note (Signed)
Normal coronary arteries/LVF, by recent cardiac catheterization. No further workup indicated

## 2010-10-24 NOTE — Progress Notes (Signed)
HPI: Patient returns status post recent R./L. cardiac catheterization, per Dr. Margarita Mail request, for further evaluation of pulmonary hypertension (RVSP 70-75 mmHg, by recent echo). He has history of nonischemic cardiomyopathy, with prior EF 45-50%; status post PPM with CRT upgrade in 2008.  Right/left cardiac catheterization, performed May 11 by Dr. Gala Romney, yielded normal coronary arteries/LV function (EF 50-55%), but with question of mild inferior HK; no significant MR; minimally elevated pulmonary artery pressure; and, normal cardiac output.  Dr Gala Romney concluded that this was most likely related to chronic lung disease.  Clinically, patient denies any exertional chest pain. He has DOE on occasion. He denies any complications of right groin incision site. He has resumed taking Coumadin, monitored and management by Dr. [name].  Allergies  Allergen Reactions  . Lisinopril Swelling    Angioedema  . Aspirin     Current Outpatient Prescriptions on File Prior to Visit  Medication Sig Dispense Refill  . albuterol-ipratropium (COMBIVENT) 18-103 MCG/ACT inhaler Inhale 2 puffs into the lungs every 6 (six) hours as needed.        . carvedilol (COREG) 25 MG tablet Take 1 tablet by mouth twice per day.       . fluticasone (FLOVENT HFA) 44 MCG/ACT inhaler Inhale 2 puffs into the lungs 2 (two) times daily.        . furosemide (LASIX) 20 MG tablet Take 20 mg by mouth as needed.        . Multiple Vitamin (MULTIVITAMIN) tablet Take 1 tablet by mouth daily.       . simvastatin (ZOCOR) 80 MG tablet Take 40 mg by mouth at bedtime.        Marland Kitchen warfarin (COUMADIN) 5 MG tablet Take daily by mouth as directed by the AntiCoagulation Clinic (Dr. [name])        Past Medical History  Diagnosis Date  . COPD (chronic obstructive pulmonary disease)     Chronic DOE  . Pulmonary embolism     Chronic Coumadin Therapy  . Nonischemic cardiomyopathy     Mild-EF 45-50%  . CHB (complete heart block)     S/P PTVP of CRT  device and ICD upgrade in 2008  . Tobacco abuse     Past Surgical History  Procedure Date  . Prostatectomy   . Cardiac defibrillator placement 2008    St. Jude Upgraded (2008)    History   Social History  . Marital Status: Married    Spouse Name: N/A    Number of Children: N/A  . Years of Education: N/A   Occupational History  . Not on file.   Social History Main Topics  . Smoking status: Former Smoker -- 0.5 packs/day for 52 years    Types: Cigarettes    Quit date: 05/26/1993  . Smokeless tobacco: Former Neurosurgeon    Types: Chew    Quit date: 05/26/1990   Comment: chewed tobacco 40 years less than a pack/day  . Alcohol Use: No     denies alcohol use  . Drug Use: No  . Sexually Active: Not on file   Other Topics Concern  . Not on file   Social History Narrative  . No narrative on file    No family history on file.  ROS: Negative for exertional chest pain, orthopnea, PND, lower extremity edema, palpitations, presyncope/syncope, claudication, reflux, hematuria, hematochezia, or melena. Remaining systems reviewed, and are negative.  PHYSICAL EXAM:  BP 148/78  Pulse 69  Ht 5\' 11"  (1.803 m)  Wt 173 lb (78.472  kg)  BMI 24.13 kg/m2  GENERAL: well-nourished, well-developed; NAD HEENT: NCAT, PERRLA, EOMI NECK: palpable bilateral carotid pulses, no bruits; no JVD LUNGS: CTA bilaterally HEART: RRR (S1, S2); 2/6 holosystolic murmur at the base, rubs, or gallops ABDOMEN: soft, non-tender; intact BS EXTREMETIES: Palpable right femoral pulse, no bruits; No hematoma, ecchymosis; intact distal pulses; no significant peripheral edema SKIN: warm/dry; no obvious rash/lesions MUSCULOSKELETAL: no gross abnormality NEURO: A/O (x3); NL affect    EKG:    ASSESSMENT & PLAN:

## 2010-10-24 NOTE — Assessment & Plan Note (Signed)
Recent right heart catheterization data suggests only minimally elevated PAP.

## 2010-10-24 NOTE — Assessment & Plan Note (Signed)
Follow up with Dr. Hillis Range, here in our device clinic, as previously scheduled.

## 2010-10-24 NOTE — Patient Instructions (Signed)
   Referral to Dr. Orson Aloe - pulmonology Your physician wants you to follow up in: 6 months.  You will receive a reminder letter in the mail one-two months in advance.  If you don't receive a letter, please call our office to schedule the follow up appointment

## 2010-10-24 NOTE — Assessment & Plan Note (Addendum)
Patient will be referred to Dr. Cherie Ouch, pulmonologist, whom he has seen in the past, for reevaluation of dyspnea. Results of recent R./L. Cardiac catheterization, and prior CPX test, January 2010, suggest that patient's symptoms most likely related to underlying lung disease. Patient has history of DVT and PE, and is on chronic Coumadin. Recent CTA of the chest, 09/30/10, yielded no pulmonary embolus, with previously noted bilateral upper lobe emphysematous changes; no acute infiltrates/edema. Will continue regular followup with Dr. Andee Lineman in 6 months.

## 2010-10-29 ENCOUNTER — Telehealth: Payer: Self-pay | Admitting: *Deleted

## 2010-10-29 DIAGNOSIS — R0600 Dyspnea, unspecified: Secondary | ICD-10-CM

## 2010-10-29 NOTE — Telephone Encounter (Signed)
Appointment scheduled for:   Friday, July 6 at 1:00 with Dr. Orson Aloe - Pulmonology here in [place].  Pt notified & verbalized understanding.

## 2010-10-29 NOTE — Telephone Encounter (Signed)
Message copied by Murriel Hopper on Tue Oct 29, 2010 11:36 AM ------      Message from: Murriel Hopper      Created: Thu Oct 24, 2010  4:14 PM       Referral to Dr. Orson Aloe for DOE - please schedule as early as possible.  Has seen him before.  Okay to wait till he can see him here in [place].

## 2010-12-19 ENCOUNTER — Encounter: Payer: Self-pay | Admitting: Internal Medicine

## 2010-12-25 ENCOUNTER — Encounter: Payer: Medicare Other | Admitting: Internal Medicine

## 2011-02-25 LAB — PROTIME-INR
INR: 1.8 — ABNORMAL HIGH
Prothrombin Time: 16.5 seconds — ABNORMAL HIGH (ref 11.6–15.2)
Prothrombin Time: 22.1 — ABNORMAL HIGH

## 2011-02-25 LAB — POCT I-STAT 3, VENOUS BLOOD GAS (G3P V)
O2 Saturation: 69 %
TCO2: 27 mmol/L (ref 0–100)
pCO2, Ven: 43.8 mmHg — ABNORMAL LOW (ref 45.0–50.0)
pH, Ven: 7.382 — ABNORMAL HIGH (ref 7.250–7.300)
pO2, Ven: 37 mmHg (ref 30.0–45.0)

## 2011-03-28 ENCOUNTER — Encounter: Payer: Self-pay | Admitting: Internal Medicine

## 2011-03-28 ENCOUNTER — Ambulatory Visit (INDEPENDENT_AMBULATORY_CARE_PROVIDER_SITE_OTHER): Payer: Medicare Other | Admitting: Internal Medicine

## 2011-03-28 DIAGNOSIS — Z950 Presence of cardiac pacemaker: Secondary | ICD-10-CM

## 2011-03-28 DIAGNOSIS — I442 Atrioventricular block, complete: Secondary | ICD-10-CM

## 2011-03-28 DIAGNOSIS — Z95810 Presence of automatic (implantable) cardiac defibrillator: Secondary | ICD-10-CM

## 2011-03-28 DIAGNOSIS — I429 Cardiomyopathy, unspecified: Secondary | ICD-10-CM

## 2011-03-28 LAB — PACEMAKER DEVICE OBSERVATION
AL AMPLITUDE: 1.25 mv
AL THRESHOLD: 0.25 V
BAMS-0001: 170 {beats}/min
BAMS-0003: 70 {beats}/min
BATTERY VOLTAGE: 2.78 V
DEVICE MODEL PM: 1873447
LV LEAD THRESHOLD: 0.75 V
RV LEAD THRESHOLD: 0.75 V

## 2011-03-28 NOTE — Progress Notes (Signed)
  HPI  [name] [name] is a 77 y.o. male een in followup for congestive heart failure in the setting of now resolved nonischemic cardiac myopathy with recent catheterization January 2012 demonstrating normal left ventricular function and no obstructive coronary disease. He is status post previously pacemaker implantation with upgrade to CRT-P implantation.  He continues to have significant limiting shortness of breath.  He is scheduled to see [name] soon Past Medical History  Diagnosis Date  . COPD (chronic obstructive pulmonary disease)     Chronic DOE  . Pulmonary embolism     Chronic Coumadin Therapy  . Nonischemic cardiomyopathy     Mild-EF 45-50%  . CHB (complete heart block)     S/P PTVP of CRT device and ICD upgrade in 2008  . Tobacco abuse     Past Surgical History  Procedure Date  . Prostatectomy   . Cardiac defibrillator placement 2008    St. Jude Upgraded (2008)    Current Outpatient Prescriptions  Medication Sig Dispense Refill  . albuterol-ipratropium (COMBIVENT) 18-103 MCG/ACT inhaler Inhale 2 puffs into the lungs every 6 (six) hours as needed.        . carvedilol (COREG) 25 MG tablet Take 1 tablet by mouth twice per day.       . flunisolide (NASALIDE) 0.025 % SOLN Inhale 2 sprays into the lungs daily.        . Multiple Vitamins-Minerals (CENTRUM SILVER PO) Take 1 tablet by mouth daily.        Marland Kitchen warfarin (COUMADIN) 5 MG tablet Take daily by mouth as directed by the AntiCoagulation Clinic (Dr. [name])        Allergies  Allergen Reactions  . Lisinopril Swelling    Angioedema  . Aspirin     Review of Systems negative except from HPI and PMH  Physical Exam Well developed and but thin male in no acute distress HENT normal E scleral and icterus clear Neck Supple Clear to ausculation Regular rate and rhythm, no murmurs gallops or rub Soft with active bowel sounds No clubbing cyanosis and edema Alert and oriented, grossly normal motor and sensory function Skin  Warm and Dry     Assessment and  Plan

## 2011-03-28 NOTE — Patient Instructions (Signed)
Your physician wants you to follow-up in: 6 months with Kristin/Paula in the [place] office for a device check & 1 year with Dr. Graciela Husbands in the [place] office. You will receive a reminder letter in the mail two months in advance. If you don't receive a letter, please call our office to schedule the follow-up appointment.  Your physician recommends that you continue on your current medications as directed. Please refer to the Current Medication list given to you today.

## 2011-03-28 NOTE — Assessment & Plan Note (Signed)
The patient's device was interrogated.  The information was reviewed. No changes were made in the programming.    

## 2011-03-28 NOTE — Assessment & Plan Note (Signed)
Resolved continue beta blockers for now  [facility] for Dr Craige Cotta to stop if he wants and use more selevctive drug

## 2011-04-08 ENCOUNTER — Encounter: Payer: Self-pay | Admitting: Pulmonary Disease

## 2011-04-09 ENCOUNTER — Ambulatory Visit (INDEPENDENT_AMBULATORY_CARE_PROVIDER_SITE_OTHER): Payer: Medicare Other | Admitting: Pulmonary Disease

## 2011-04-09 ENCOUNTER — Encounter: Payer: Self-pay | Admitting: Pulmonary Disease

## 2011-04-09 VITALS — BP 112/74 | HR 69 | Temp 98.0°F | Ht 72.0 in | Wt 170.0 lb

## 2011-04-09 DIAGNOSIS — R0989 Other specified symptoms and signs involving the circulatory and respiratory systems: Secondary | ICD-10-CM

## 2011-04-09 DIAGNOSIS — Z86718 Personal history of other venous thrombosis and embolism: Secondary | ICD-10-CM

## 2011-04-09 DIAGNOSIS — J449 Chronic obstructive pulmonary disease, unspecified: Secondary | ICD-10-CM

## 2011-04-09 DIAGNOSIS — R0600 Dyspnea, unspecified: Secondary | ICD-10-CM

## 2011-04-09 MED ORDER — BUDESONIDE-FORMOTEROL FUMARATE 160-4.5 MCG/ACT IN AERO: 2.0000 | INHALATION_SPRAY | Freq: Two times a day (BID) | RESPIRATORY_TRACT | Status: DC

## 2011-04-09 NOTE — Progress Notes (Signed)
Chief Complaint  Patient presents with  . Pulmonary consult for duspnea    Pt c/o increased sob x 4 months when walking or climbing stairs    History of Present Illness:  [name] [name] is a 77 y.o. male former smoker for evaluation of dyspnea.  He has noticed more trouble with is breathing for the past 4 months.  He has a history of smoking, and has been told he has COPD.  He has been using spiriva and recently started on daliresp.  These have helped some.  He was using advair.  He stopped this because it made him feel sick.  He gets winded mostly when going up stairs.  He is okay on level ground.  He does wheeze some.  He will get occasional coughing spells.  He is not bringing up much sputum, and denies hemoptysis.  He uses albuterol nebulizer once per day in the morning, and xopenex HFA as needed.    He quit smoking 15 years ago.  He had several episodes of pneumonia when he was younger.  He is on chronic coumadin for history of pulmonary embolism.  He denies occupational exposures.  He had recent cardiac evaluation, including right and left heart catheterization which were relatively unremarkable.  Past Medical History  Diagnosis Date  . COPD (chronic obstructive pulmonary disease)     Chronic DOE  . Pulmonary embolism     Chronic Coumadin Therapy  . Nonischemic cardiomyopathy     Mild-EF 45-50%  . CHB (complete heart block)     S/P PTVP of CRT device and ICD upgrade in 2008  . Tobacco abuse   . Hypercholesterolemia   . Peripheral neuropathy   . Hypertension   . Arthritis   . Allergic rhinitis   . Prostate cancer     Past Surgical History  Procedure Date  . Prostatectomy   . Cardiac defibrillator placement 2008    St. Jude Upgraded (2008)    Current Outpatient Prescriptions on File Prior to Visit  Medication Sig Dispense Refill  . albuterol (PROVENTIL) (2.5 MG/3ML) 0.083% nebulizer solution Take 2.5 mg by nebulization every 6 (six) hours as needed.        Marland Kitchen  albuterol (VENTOLIN HFA) 108 (90 BASE) MCG/ACT inhaler Inhale 2 puffs into the lungs every 6 (six) hours as needed.        Marland Kitchen albuterol-ipratropium (COMBIVENT) 18-103 MCG/ACT inhaler Inhale 2 puffs into the lungs every 6 (six) hours as needed.        Marland Kitchen atorvastatin (LIPITOR) 20 MG tablet Take 20 mg by mouth daily.        . carvedilol (COREG) 25 MG tablet Take 1 tablet by mouth twice per day.       . celecoxib (CELEBREX) 200 MG capsule Take 200 mg by mouth daily.        . flunisolide (NASALIDE) 0.025 % SOLN Inhale 2 sprays into the lungs daily.        . furosemide (LASIX) 40 MG tablet 1/2 tablet daily as needed       . megestrol (MEGACE) 400 MG/10ML suspension Take as directed       . Multiple Vitamins-Minerals (CENTRUM SILVER PO) Take 1 tablet by mouth daily.        . roflumilast (DALIRESP) 500 MCG TABS tablet Take 500 mcg by mouth daily.        Marland Kitchen warfarin (COUMADIN) 5 MG tablet Take daily by mouth as directed by the [facility] (Dr. [name])  Allergies  Allergen Reactions  . Lisinopril Swelling    Angioedema  . Aspirin     family history includes Asthma in an unspecified family member; Heart failure in his father; Hypertension in his mother; and Stroke in an unspecified family member.   reports that he quit smoking about 17 years ago. His smoking use included Cigarettes. He has a 26 pack-year smoking history. He quit smokeless tobacco use about 20 years ago. His smokeless tobacco use included Chew. He reports that he does not drink alcohol or use illicit drugs.  ROS: paresthesia's in his legs  Blood pressure 112/74, pulse 69, temperature 98 F (36.7 C), temperature source Oral, height 6' (1.829 m), weight 170 lb (77.111 kg), SpO2 99.00%.  Physical Exam:  General - Thin HEENT - PERRLA, EOMI, no sinus tenderness, no oral exudate, no LAN, no thyromegaly Cardiac - s1s2 regular, no murmur Chest - prolonged exhalation, no wheeze/rales/dullness Abdomen - soft, non-tender,  no organomegaly Extremities - no e/c/c Neurologic - normal strength, CN intact Skin - no rashes Psychiatric - normal mood, behavior  Spirometry 04/09/11>>FEV1 0.69 (24%), FEV1% 37  CT chest from 09/30/10>>no acute PE, b/l upper lobe predominant emphysema, old sequela of prior PE in LLL posterior segment  Rt heart cath 10/04/10>>right atrial pressure mean of 5, RV pressure 39/4 with an EDP of eight. PA pressure 41/18 with a mean of 28. Pulmonary capillary wedge pressure mean of 11. Fick by CK cardiac output is 5.9. Cardiac index 3.0. Aortic pressure is 156/73 with a mean of 106. LV pressure 157/20 with EDP of 22. There is no significant aortic stenosis on pullback. Pulmonary vascular resistance was 2.9 Woods units.  Saturations femoral artery with 94% PA was 69% and 69%.   Assessment/Plan:  COPD He has severe airflow obstruction on spirometry today.  Will arrange for full pulmonary function testing to further assess.    Advised him to continue spiriva, and daliresp.  Will have him try schedule use of symbicort.  He can continue albuterol nebulizer and xopenex HFA as needed.  Will arrange for overnight oximetry to assess whether he needs to have nocturnal oxygen use.  PULMONARY EMBOLISM, HX OF He is on chronic coumadin therapy.     Outpatient Encounter Prescriptions as of 04/09/2011  Medication Sig Dispense Refill  . albuterol (PROVENTIL) (2.5 MG/3ML) 0.083% nebulizer solution Take 2.5 mg by nebulization every 6 (six) hours as needed.        Marland Kitchen albuterol (VENTOLIN HFA) 108 (90 BASE) MCG/ACT inhaler Inhale 2 puffs into the lungs every 6 (six) hours as needed.        Marland Kitchen albuterol-ipratropium (COMBIVENT) 18-103 MCG/ACT inhaler Inhale 2 puffs into the lungs every 6 (six) hours as needed.        Marland Kitchen atorvastatin (LIPITOR) 20 MG tablet Take 20 mg by mouth daily.        . carvedilol (COREG) 25 MG tablet Take 1 tablet by mouth twice per day.       . celecoxib (CELEBREX) 200 MG capsule Take 200  mg by mouth daily.        . flunisolide (NASALIDE) 0.025 % SOLN Inhale 2 sprays into the lungs daily.        . furosemide (LASIX) 40 MG tablet 1/2 tablet daily as needed       . megestrol (MEGACE) 400 MG/10ML suspension Take as directed       . Multiple Vitamins-Minerals (CENTRUM SILVER PO) Take 1 tablet by mouth daily.        Marland Kitchen  roflumilast (DALIRESP) 500 MCG TABS tablet Take 500 mcg by mouth daily.        Marland Kitchen warfarin (COUMADIN) 5 MG tablet Take daily by mouth as directed by the [facility] (Dr. [name])      . DISCONTD: Fluticasone-Salmeterol (ADVAIR) 250-50 MCG/DOSE AEPB Inhale 1 puff into the lungs every 12 (twelve) hours.        . budesonide-formoterol (SYMBICORT) 160-4.5 MCG/ACT inhaler Inhale 2 puffs into the lungs 2 (two) times daily.  1 Inhaler  12  . DISCONTD: pimecrolimus (ELIDEL) 1 % cream Apply 1 application topically daily as needed.        Marland Kitchen DISCONTD: polyethylene glycol (MIRALAX / GLYCOLAX) packet Take 17 g by mouth daily.        Marland Kitchen DISCONTD: sildenafil (VIAGRA) 100 MG tablet Take 100 mg by mouth daily as needed.        Marland Kitchen DISCONTD: tiotropium (SPIRIVA) 18 MCG inhalation capsule Place 18 mcg into inhaler and inhale daily.          Zanaiya Calabria Pager:  [phone] 04/09/2011, 5:54 PM

## 2011-04-09 NOTE — Progress Notes (Deleted)
  Subjective:    Patient ID: [name] [name], male    DOB: 05-05-1928, 77 y.o.   MRN: [id number]  HPI    Review of Systems  Constitutional: Positive for appetite change and unexpected weight change.  HENT: Negative.   Eyes: Negative.   Respiratory: Positive for shortness of breath.   Cardiovascular: Negative.   Gastrointestinal: Negative.   Genitourinary: Negative.   Musculoskeletal: Negative.   Skin: Negative.   Neurological: Negative.   Hematological: Negative.   Psychiatric/Behavioral: Negative.        Objective:   Physical Exam        Assessment & Plan:

## 2011-04-09 NOTE — Assessment & Plan Note (Signed)
He is on chronic coumadin therapy.

## 2011-04-09 NOTE — Assessment & Plan Note (Signed)
He has severe airflow obstruction on spirometry today.  Will arrange for full pulmonary function testing to further assess.    Advised him to continue spiriva, and daliresp.  Will have him try schedule use of symbicort.  He can continue albuterol nebulizer and xopenex HFA as needed.  Will arrange for overnight oximetry to assess whether he needs to have nocturnal oxygen use.

## 2011-04-09 NOTE — Patient Instructions (Signed)
Continue spiriva and daliresp Will add symbicort two puffs twice per day, and rinse mouth after using Will arrange for oxygen test at night Will schedule breathing test (PFT) Follow up in 4 weeks

## 2011-04-24 ENCOUNTER — Ambulatory Visit: Payer: Medicare Other | Admitting: Cardiology

## 2011-05-09 ENCOUNTER — Ambulatory Visit (INDEPENDENT_AMBULATORY_CARE_PROVIDER_SITE_OTHER): Payer: Medicare Other | Admitting: Pulmonary Disease

## 2011-05-09 ENCOUNTER — Encounter: Payer: Self-pay | Admitting: Pulmonary Disease

## 2011-05-09 VITALS — BP 118/68 | HR 91 | Temp 98.3°F | Ht 72.0 in | Wt 168.0 lb

## 2011-05-09 DIAGNOSIS — J449 Chronic obstructive pulmonary disease, unspecified: Secondary | ICD-10-CM

## 2011-05-09 LAB — PULMONARY FUNCTION TEST

## 2011-05-09 NOTE — Progress Notes (Signed)
Chief Complaint  Patient presents with  . Follow-up    w/ PFT. Pt states he still gets SOB w/ exertion.    History of Present Illness: [name] [name] is a 77 y.o. male former smoker with COPD.  He feels symbicort has helped.  He is not having much cough, wheeze, or sputum.  He feels he is doing more activity since starting symbicort.  He wants a new nebulizer.  Past Medical History  Diagnosis Date  . COPD (chronic obstructive pulmonary disease)     Chronic DOE  . Pulmonary embolism     Chronic Coumadin Therapy  . Nonischemic cardiomyopathy     Mild-EF 45-50%  . CHB (complete heart block)     S/P PTVP of CRT device and ICD upgrade in 2008  . Tobacco abuse   . Hypercholesterolemia   . Peripheral neuropathy   . Hypertension   . Arthritis   . Allergic rhinitis   . Prostate cancer     Past Surgical History  Procedure Date  . Prostatectomy   . Cardiac defibrillator placement 2008    St. Jude Upgraded (2008)    Current Outpatient Prescriptions on File Prior to Visit  Medication Sig Dispense Refill  . albuterol (PROVENTIL) (2.5 MG/3ML) 0.083% nebulizer solution Take 2.5 mg by nebulization every 6 (six) hours as needed.        Marland Kitchen albuterol (VENTOLIN HFA) 108 (90 BASE) MCG/ACT inhaler Inhale 2 puffs into the lungs every 6 (six) hours as needed.        Marland Kitchen albuterol-ipratropium (COMBIVENT) 18-103 MCG/ACT inhaler Inhale 2 puffs into the lungs every 6 (six) hours as needed.        Marland Kitchen atorvastatin (LIPITOR) 20 MG tablet Take 20 mg by mouth daily.        . budesonide-formoterol (SYMBICORT) 160-4.5 MCG/ACT inhaler Inhale 2 puffs into the lungs 2 (two) times daily.  1 Inhaler  12  . carvedilol (COREG) 25 MG tablet Take 1 tablet by mouth twice per day.       . celecoxib (CELEBREX) 200 MG capsule Take 200 mg by mouth daily.        . flunisolide (NASALIDE) 0.025 % SOLN Inhale 2 sprays into the lungs daily.        . furosemide (LASIX) 40 MG tablet 1/2 tablet daily as needed       . megestrol  (MEGACE) 400 MG/10ML suspension Take as directed       . Multiple Vitamins-Minerals (CENTRUM SILVER PO) Take 1 tablet by mouth daily.        . roflumilast (DALIRESP) 500 MCG TABS tablet Take 500 mcg by mouth daily.        Marland Kitchen warfarin (COUMADIN) 5 MG tablet Take daily by mouth as directed by the [facility] (Dr. Sherryll [name])        Allergies  Allergen Reactions  . Lisinopril Swelling    Angioedema  . Aspirin     Physical Exam:  Blood pressure 118/68, pulse 91, temperature 98.3 F (36.8 C), temperature source Oral, height 6' (1.829 m), weight 168 lb (76.204 kg), SpO2 97.00%.  General - Thin  HEENT - PERRLA, EOMI, no sinus tenderness, no oral exudate, no LAN, no thyromegaly  Cardiac - s1s2 regular, no murmur  Chest - prolonged exhalation, no wheeze/rales/dullness  Abdomen - soft, non-tender, no organomegaly  Extremities - no e/c/c  Neurologic - normal strength, CN intact  Skin - no rashes  Psychiatric - normal mood, behavior  PFT 05/09/11>>FEV1 1.16(42%), FEV1%  45, TLC 5.22(81%), DLCO 40%, +BD  Assessment/Plan:  Outpatient Encounter Prescriptions as of 05/09/2011  Medication Sig Dispense Refill  . albuterol (PROVENTIL) (2.5 MG/3ML) 0.083% nebulizer solution Take 2.5 mg by nebulization every 6 (six) hours as needed.        Marland Kitchen albuterol (VENTOLIN HFA) 108 (90 BASE) MCG/ACT inhaler Inhale 2 puffs into the lungs every 6 (six) hours as needed.        Marland Kitchen albuterol-ipratropium (COMBIVENT) 18-103 MCG/ACT inhaler Inhale 2 puffs into the lungs every 6 (six) hours as needed.        Marland Kitchen atorvastatin (LIPITOR) 20 MG tablet Take 20 mg by mouth daily.        . budesonide-formoterol (SYMBICORT) 160-4.5 MCG/ACT inhaler Inhale 2 puffs into the lungs 2 (two) times daily.  1 Inhaler  12  . carvedilol (COREG) 25 MG tablet Take 1 tablet by mouth twice per day.       . celecoxib (CELEBREX) 200 MG capsule Take 200 mg by mouth daily.        . flunisolide (NASALIDE) 0.025 % SOLN Inhale 2 sprays into the  lungs daily.        . furosemide (LASIX) 40 MG tablet 1/2 tablet daily as needed       . megestrol (MEGACE) 400 MG/10ML suspension Take as directed       . Multiple Vitamins-Minerals (CENTRUM SILVER PO) Take 1 tablet by mouth daily.        . roflumilast (DALIRESP) 500 MCG TABS tablet Take 500 mcg by mouth daily.        Marland Kitchen warfarin (COUMADIN) 5 MG tablet Take daily by mouth as directed by the [facility] (Dr. Sherryll [name])        [name] [name] Pager:  [phone] 05/09/2011, 1:42 PM

## 2011-05-09 NOTE — Assessment & Plan Note (Signed)
He has GOLD 4 COPD with bronchodilator responsiveness.  He is to continue symbicort and prn combivent.  Will arrange for new nebulizer.  He can try stopping daliresp and monitor his symptoms.

## 2011-05-09 NOTE — Progress Notes (Signed)
PFT done today. 

## 2011-05-09 NOTE — Patient Instructions (Signed)
Try stopping daliresp Will arrange for new nebulizer Follow up in 6 months

## 2011-05-14 ENCOUNTER — Telehealth: Payer: Self-pay | Admitting: Pulmonary Disease

## 2011-05-14 MED ORDER — ALBUTEROL SULFATE (2.5 MG/3ML) 0.083% IN NEBU: 2.5000 mg | INHALATION_SOLUTION | Freq: Four times a day (QID) | RESPIRATORY_TRACT | Status: DC | PRN

## 2011-05-14 NOTE — Telephone Encounter (Signed)
Rx has been faxed to # listed.

## 2011-05-14 NOTE — Telephone Encounter (Signed)
Pt returned call - states he has been using [facility] for his neb in the past, but has not been "serviced" in 3-4 years.  States was discussed with VS at last ov to get new neb machine thru [facility].  Order was placed at 12.14.12 for new neb start thru [facility] but apparently no one has contacted patient.    Called [facility], spoke with Carley Hammed who verified that the order was received and faxed to the [facility] office per their system.  Carley Hammed stated that they "will take care of this" but needs an rx for the neb medication faxed to [phone].  Per pt's chart, is taking albuterol neb soln q6h prn.  rx printed off for VS to sign. Message routed to VS.

## 2011-05-14 NOTE — Telephone Encounter (Signed)
LMOMTCB x 1 

## 2011-05-15 ENCOUNTER — Telehealth: Payer: Self-pay | Admitting: Pulmonary Disease

## 2011-05-15 NOTE — Telephone Encounter (Signed)
I spoke with Amy and made her aware that JJ spoke with eva from [facility] yesterday 12/19/12and this was being taken care of by them. She voiced her understanding and needed nothing further

## 2011-05-16 ENCOUNTER — Encounter: Payer: Self-pay | Admitting: Internal Medicine

## 2011-10-06 ENCOUNTER — Encounter: Payer: Self-pay | Admitting: Internal Medicine

## 2011-10-06 ENCOUNTER — Ambulatory Visit (INDEPENDENT_AMBULATORY_CARE_PROVIDER_SITE_OTHER): Payer: Medicare Other | Admitting: *Deleted

## 2011-10-06 DIAGNOSIS — Z950 Presence of cardiac pacemaker: Secondary | ICD-10-CM

## 2011-10-06 DIAGNOSIS — R0602 Shortness of breath: Secondary | ICD-10-CM

## 2011-10-06 DIAGNOSIS — I442 Atrioventricular block, complete: Secondary | ICD-10-CM

## 2011-10-06 LAB — PACEMAKER DEVICE OBSERVATION
AL IMPEDENCE PM: 315 Ohm
AL THRESHOLD: 1 V
ATRIAL PACING PM: 98
BAMS-0001: 170 {beats}/min
BAMS-0003: 70 {beats}/min
DEVICE MODEL PM: 1873447
LV LEAD THRESHOLD: 1 V
RV LEAD THRESHOLD: 1 V

## 2011-10-06 NOTE — Progress Notes (Signed)
Patient presents for device clinic pacemaker check.  Pt recovering from hospitalization with pneumonia.   Device interrogated and found to be functioning normally.  No changes made today.  See PaceArt report for full details.  Plan ROV with Dr. Johney Frame in 6 months.  Gypsy Balsam, RN, BSN 10/06/2011 10:54 AM

## 2011-11-05 ENCOUNTER — Other Ambulatory Visit: Payer: Self-pay | Admitting: Physician Assistant

## 2011-11-05 ENCOUNTER — Ambulatory Visit (INDEPENDENT_AMBULATORY_CARE_PROVIDER_SITE_OTHER): Payer: Medicare Other | Admitting: Physician Assistant

## 2011-11-05 ENCOUNTER — Encounter: Payer: Self-pay | Admitting: Physician Assistant

## 2011-11-05 ENCOUNTER — Encounter: Payer: Self-pay | Admitting: *Deleted

## 2011-11-05 VITALS — BP 142/72 | HR 69 | Ht 71.0 in | Wt 165.8 lb

## 2011-11-05 DIAGNOSIS — I429 Cardiomyopathy, unspecified: Secondary | ICD-10-CM

## 2011-11-05 DIAGNOSIS — Z86718 Personal history of other venous thrombosis and embolism: Secondary | ICD-10-CM

## 2011-11-05 DIAGNOSIS — I442 Atrioventricular block, complete: Secondary | ICD-10-CM

## 2011-11-05 DIAGNOSIS — R0602 Shortness of breath: Secondary | ICD-10-CM

## 2011-11-05 DIAGNOSIS — J449 Chronic obstructive pulmonary disease, unspecified: Secondary | ICD-10-CM

## 2011-11-05 NOTE — Assessment & Plan Note (Signed)
On chronic Coumadin, followed by Dr. Shah 

## 2011-11-05 NOTE — Assessment & Plan Note (Signed)
Patient presents with worsening DOE. He has normal coronary arteries, as assessed by R./L. cardiac catheterization, 5/12. EF was improved (50-55%), compared to prior estimate 45-50%. There was no significant MR, normal CO, and minimally elevated PAP. He currently presents with no clinical evidence of CHF, with recent normal BNP level and negative CXR. Therefore, suspect that his symptoms are essentially pulmonary in etiology; however, I am concerned that this may also represent chronotropic incompetence. We'll order a 2-D echo for reassessment of LVF, and rule out of any significant valvular disease. We'll also order a routine GXT to rule out chronotropic incompetence. We'll plan early return visit with myself/Dr. Degent in 2 weeks for review and further recommendations. Of note, patient is scheduled to see Dr. Coralyn Helling, [facility], June 17.

## 2011-11-05 NOTE — Assessment & Plan Note (Signed)
Followup with Dr. Johney Frame, as scheduled. Status post recent routine device check with no adjustments recommended.

## 2011-11-05 NOTE — Progress Notes (Signed)
HPI: Patient presents for evaluation of worsening DOE. He was last seen here in office for routine followup, back in May 2012.  Patient reports having been recently hospitalized for pneumonia, here at [facility]. We were not formally consulted. He complains of worsening DOE over the last month, or so. Recent workup included extensive labs and CXR: Potassium, renal function, TSH level, and BNP were all within normal limits. Hemoglobin 12/HCT 40. CXR yielded COPD, with no acute changes.  Patient had recent routine device clinic followup in May, at which time no adjustments were made.  Clinically, he denies any CP, tachycardia palpitations, PND, orthopnea, LE edema, or ICD discharge. His symptoms are strictly that of DOE, even if walking on level ground. Of note, he has lost 8 pounds since last OV.  EKG today indicates AV pacing at 70 bpm.  Allergies  Allergen Reactions  . Lisinopril Swelling    Angioedema  . Aspirin     Current Outpatient Prescriptions  Medication Sig Dispense Refill  . albuterol (PROVENTIL) (2.5 MG/3ML) 0.083% nebulizer solution Take 3 mLs (2.5 mg total) by nebulization every 6 (six) hours as needed for wheezing or shortness of breath.  75 mL  6  . albuterol (VENTOLIN HFA) 108 (90 BASE) MCG/ACT inhaler Inhale 2 puffs into the lungs every 6 (six) hours as needed.        Marland Kitchen albuterol-ipratropium (COMBIVENT) 18-103 MCG/ACT inhaler Inhale 2 puffs into the lungs every 6 (six) hours as needed.        Marland Kitchen atorvastatin (LIPITOR) 20 MG tablet Take 20 mg by mouth daily.        . budesonide-formoterol (SYMBICORT) 160-4.5 MCG/ACT inhaler Inhale 2 puffs into the lungs 2 (two) times daily.  1 Inhaler  12  . carvedilol (COREG) 25 MG tablet Take 1 tablet by mouth twice per day.       . celecoxib (CELEBREX) 200 MG capsule Take 200 mg by mouth daily.        . flunisolide (NASALIDE) 0.025 % SOLN Inhale 2 sprays into the lungs daily.        . furosemide (LASIX) 40 MG tablet 1/2 tablet  daily as needed       . megestrol (MEGACE) 400 MG/10ML suspension Take as directed       . Multiple Vitamins-Minerals (CENTRUM SILVER PO) Take 1 tablet by mouth daily.        Marland Kitchen warfarin (COUMADIN) 5 MG tablet Take daily by mouth as directed by the [facility] (Dr. [name])        Past Medical History  Diagnosis Date  . COPD (chronic obstructive pulmonary disease)     Chronic DOE  . Pulmonary embolism     Chronic Coumadin Therapy  . Nonischemic cardiomyopathy     Mild-EF 45-50%  . CHB (complete heart block)     S/P PTVP of CRT device and ICD upgrade in 2008  . Tobacco abuse   . Hypercholesterolemia   . Peripheral neuropathy   . Hypertension   . Arthritis   . Allergic rhinitis   . Prostate cancer     Past Surgical History  Procedure Date  . Prostatectomy   . Cardiac defibrillator placement 2008    St. Jude Upgraded (2008)    History   Social History  . Marital Status: Married    Spouse Name: N/A    Number of Children: N/A  . Years of Education: N/A   Occupational History  . RETIRED BLDG CONTRACTOR    Social  History Main Topics  . Smoking status: Former Smoker -- 0.5 packs/day for 52 years    Types: Cigarettes    Quit date: 05/26/1993  . Smokeless tobacco: Former Neurosurgeon    Types: Chew    Quit date: 05/26/1990   Comment: chewed tobacco 40 years less than a pack/day  . Alcohol Use: No     denies alcohol use  . Drug Use: No  . Sexually Active: Not on file   Other Topics Concern  . Not on file   Social History Narrative  . No narrative on file    Family History  Problem Relation Age of Onset  . Hypertension Mother   . Heart failure Father   . Stroke    . Asthma      ROS: no nausea, vomiting; no fever, chills; no melena, hematochezia; no claudication  PHYSICAL EXAM: There were no vitals taken for this visit. GENERAL: 78 year-old male, sitting upright; NAD HEENT: NCAT, PERRLA, EOMI; sclera clear; no xanthelasma NECK: palpable bilateral carotid  pulses, no bruits; no JVD; no TM LUNGS: Diminished breath sounds, but no crackles or wheezes CARDIAC: RRR (S1, S2); no significant murmurs; no rubs or gallops ABDOMEN: soft, non-tender; intact BS EXTREMETIES: intact distal pulses; no significant peripheral edema SKIN: warm/dry; no obvious rash/lesions MUSCULOSKELETAL: no joint deformity NEURO: no focal deficit; NL affect   EKG: reviewed and available in Electronic Records   ASSESSMENT & PLAN:  No problem-specific assessment & plan notes found for this encounter.   Gene Litzi Binning, PAC

## 2011-11-05 NOTE — Assessment & Plan Note (Signed)
Followed by Dr. Vineet Sood 

## 2011-11-05 NOTE — Patient Instructions (Addendum)
Continue all current medications.  Echo  GXT (exercise stress test) If the results of your test are normal or stable, you will receive a letter.  If they are abnormal, the nurse will contact you by phone. Follow up in  2 weeks

## 2011-11-07 DIAGNOSIS — R0602 Shortness of breath: Secondary | ICD-10-CM

## 2011-11-10 ENCOUNTER — Encounter: Payer: Self-pay | Admitting: Pulmonary Disease

## 2011-11-10 ENCOUNTER — Ambulatory Visit (INDEPENDENT_AMBULATORY_CARE_PROVIDER_SITE_OTHER): Payer: Medicare Other | Admitting: Pulmonary Disease

## 2011-11-10 VITALS — BP 142/72 | HR 86 | Temp 97.9°F | Ht 71.5 in | Wt 166.6 lb

## 2011-11-10 DIAGNOSIS — J449 Chronic obstructive pulmonary disease, unspecified: Secondary | ICD-10-CM

## 2011-11-10 DIAGNOSIS — R0602 Shortness of breath: Secondary | ICD-10-CM

## 2011-11-10 MED ORDER — ALBUTEROL SULFATE HFA 108 (90 BASE) MCG/ACT IN AERS: 2.0000 | INHALATION_SPRAY | Freq: Four times a day (QID) | RESPIRATORY_TRACT | Status: DC | PRN

## 2011-11-10 MED ORDER — TIOTROPIUM BROMIDE MONOHYDRATE 18 MCG IN CAPS: 18.0000 ug | ORAL_CAPSULE | Freq: Every day | RESPIRATORY_TRACT | Status: DC

## 2011-11-10 NOTE — Patient Instructions (Addendum)
Stop symbicort Spiriva one puff daily Proair two puffs as needed for cough, wheeze, or chest congestion Albuterol nebulizer four times per day as needed for cough, wheeze, or chest congestion Will arrange for overnight oxygen test and call with results Follow up in 6 months

## 2011-11-10 NOTE — Assessment & Plan Note (Signed)
He has not noticed benefit from symbicort.  Will stop this, and change to spiriva.  Will continue prn albuterol nebulizer and arrange for albuterol puffer.

## 2011-11-10 NOTE — Assessment & Plan Note (Signed)
Could be related to COPD or his heart disease.  Will assess need for home oxygen with ONO.  Will see how he responds to change to inhaler regimen.  Await results of cardiac testing.

## 2011-11-10 NOTE — Progress Notes (Signed)
Chief Complaint  Patient presents with  . Follow-up    Pt c/o increase SOB x march since having dbl PNA. Pt has occasional cough, wheezing, denies any chest tx.    History of Present Illness: [name] [name] is a 78 y.o. male former smoker with GOLD 4 COPD.  He was hospitalized in March for pneumonia at [facility].  He has noticed more trouble with his breathing since.  He was prescribed oxygen from then.  He wasn't sure how much this helped, and has not been using it.  He does not feel like symbicort helps much.    He is okay at rest.  He gets winded if he walks 100 feet, but not all the time.  He has occasional cough with clear sputum, and wheeze.  He denies fever, hemoptysis, or sinus congestion.  His leg swelling is controlled with lasix.    He was seen by cardiology earlier this month.  He had Echo last week, but results are not available yet.  Labs from 10/31/11 reviewed and unremarkable.   Past Medical History  Diagnosis Date  . COPD (chronic obstructive pulmonary disease)     Chronic DOE  . Pulmonary embolism     Chronic Coumadin Therapy  . Nonischemic cardiomyopathy     Mild-EF 45-50%  . CHB (complete heart block)     S/P PTVP of CRT device and ICD upgrade in 2008  . Tobacco abuse   . Hypercholesterolemia   . Peripheral neuropathy   . Hypertension   . Arthritis   . Allergic rhinitis   . Prostate cancer     Past Surgical History  Procedure Date  . Prostatectomy   . Cardiac defibrillator placement 2008    St. Jude Upgraded (2008)    Current Outpatient Prescriptions on File Prior to Visit  Medication Sig Dispense Refill  . albuterol (PROVENTIL) (2.5 MG/3ML) 0.083% nebulizer solution Take 3 mLs (2.5 mg total) by nebulization every 6 (six) hours as needed for wheezing or shortness of breath.  75 mL  6  . carvedilol (COREG) 25 MG tablet Take 1 tablet by mouth twice per day.       . flunisolide (NASALIDE) 0.025 % SOLN Place 2 sprays into the nose daily.       .  furosemide (LASIX) 40 MG tablet 1/2 tablet daily as needed       . Multiple Vitamins-Minerals (CENTRUM SILVER PO) Take 1 tablet by mouth daily.        Marland Kitchen warfarin (COUMADIN) 5 MG tablet Take daily by mouth as directed by the [facility] (Dr. [name])      . tiotropium (SPIRIVA) 18 MCG inhalation capsule Place 1 capsule (18 mcg total) into inhaler and inhale daily.  30 capsule  6    Allergies  Allergen Reactions  . Lisinopril Swelling    Angioedema  . Aspirin     Physical Exam:  Blood pressure 142/72, pulse 86, temperature 97.9 F (36.6 C), temperature source Oral, height 5' 11.5" (1.816 m), weight 166 lb 9.6 oz (75.569 kg), SpO2 95.00%.  General - Thin  HEENT - PERRLA, EOMI, no sinus tenderness, no oral exudate, no LAN, no thyromegaly  Cardiac - s1s2 regular, no murmur  Chest - prolonged exhalation, no wheeze/rales/dullness  Abdomen - soft, non-tender, no organomegaly  Extremities - no e/c/c  Neurologic - normal strength, CN intact  Skin - no rashes  Psychiatric - normal mood, behavior  CXR 10/31/11 >> COPD changes.  Assessment/Plan:  Outpatient Encounter Prescriptions  as of 11/10/2011  Medication Sig Dispense Refill  . albuterol (PROVENTIL) (2.5 MG/3ML) 0.083% nebulizer solution Take 3 mLs (2.5 mg total) by nebulization every 6 (six) hours as needed for wheezing or shortness of breath.  75 mL  6  . carvedilol (COREG) 25 MG tablet Take 1 tablet by mouth twice per day.       . flunisolide (NASALIDE) 0.025 % SOLN Place 2 sprays into the nose daily.       . furosemide (LASIX) 40 MG tablet 1/2 tablet daily as needed       . Multiple Vitamins-Minerals (CENTRUM SILVER PO) Take 1 tablet by mouth daily.        Marland Kitchen warfarin (COUMADIN) 5 MG tablet Take daily by mouth as directed by the [facility] (Dr. [name])      . albuterol (PROAIR HFA) 108 (90 BASE) MCG/ACT inhaler Inhale 2 puffs into the lungs every 6 (six) hours as needed for wheezing or shortness of breath.  1  Inhaler  5  . tiotropium (SPIRIVA) 18 MCG inhalation capsule Place 1 capsule (18 mcg total) into inhaler and inhale daily.  30 capsule  6  . DISCONTD: budesonide-formoterol (SYMBICORT) 160-4.5 MCG/ACT inhaler Inhale 2 puffs into the lungs daily.        Scott Vanderveer Pager:  [phone] 11/10/2011, 9:52 AM

## 2011-11-11 ENCOUNTER — Telehealth: Payer: Self-pay | Admitting: *Deleted

## 2011-11-11 NOTE — Telephone Encounter (Signed)
Notes Recorded by Lesle Chris, LPN on 09/01/8117 at 9:37 AM Patient notified and verbalized understanding. Has follow up OV scheduled for 6/26 & will discuss further in detail at that time.

## 2011-11-11 NOTE — Telephone Encounter (Signed)
Message copied by Lesle Chris on Tue Nov 11, 2011  9:38 AM ------      Message from: Prescott Parma C      Created: Mon Nov 10, 2011  5:40 PM       EF 45-50%, with multiple WMAs. Mild RVD. Mild AS, m/mod AR. Mod PHTN (RVSP 54). Pt had NL coronaries by R/L cath, 5/12. Will need to review at f/u. ? Repeat cath, given WMAs.

## 2011-11-19 ENCOUNTER — Encounter: Payer: Self-pay | Admitting: Physician Assistant

## 2011-11-19 ENCOUNTER — Ambulatory Visit (INDEPENDENT_AMBULATORY_CARE_PROVIDER_SITE_OTHER): Payer: Medicare Other | Admitting: Physician Assistant

## 2011-11-19 ENCOUNTER — Encounter: Payer: Self-pay | Admitting: *Deleted

## 2011-11-19 ENCOUNTER — Telehealth: Payer: Self-pay | Admitting: Cardiology

## 2011-11-19 VITALS — BP 127/73 | HR 84 | Resp 16 | Ht 71.0 in | Wt 164.0 lb

## 2011-11-19 DIAGNOSIS — R0602 Shortness of breath: Secondary | ICD-10-CM

## 2011-11-19 DIAGNOSIS — J449 Chronic obstructive pulmonary disease, unspecified: Secondary | ICD-10-CM

## 2011-11-19 DIAGNOSIS — Z950 Presence of cardiac pacemaker: Secondary | ICD-10-CM

## 2011-11-19 DIAGNOSIS — Z01810 Encounter for preprocedural cardiovascular examination: Secondary | ICD-10-CM

## 2011-11-19 DIAGNOSIS — Z86718 Personal history of other venous thrombosis and embolism: Secondary | ICD-10-CM

## 2011-11-19 DIAGNOSIS — Z7901 Long term (current) use of anticoagulants: Secondary | ICD-10-CM

## 2011-11-19 NOTE — Assessment & Plan Note (Signed)
Following review with Dr. Diona Browner, plan is to proceed with repeat right/left cardiac catheterization. Although it is unlikely that he has since developed significant CAD, we want to definitively rule this out. Moreover, we will reassess hemodynamics with right-sided heart pressures. When last studied, he had minimally elevated LVEDP with normal CO, and Dr. Gala Romney concluded that this was most likely related to chronic lung disease.  His recent repeat echo did suggest moderate PHTN (RVSP 54 mmHg); however, there was suggestion of multiple WMAs with mild LVD (EF 45-50%). Following the study, we'll have patient return to clinic for followup with Dr. Andee Lineman for further recommendations. Patient is to continue on current medication regimen.

## 2011-11-19 NOTE — Assessment & Plan Note (Signed)
On chronic Coumadin, followed by Dr. Shah 

## 2011-11-19 NOTE — Assessment & Plan Note (Signed)
Follow with Dr. Hillis Range, as scheduled.

## 2011-11-19 NOTE — Assessment & Plan Note (Signed)
Followed by Dr. Coralyn Helling

## 2011-11-19 NOTE — Patient Instructions (Addendum)
   Right & left heart cath - see info sheet given Follow up will be given at time of discharge from above

## 2011-11-19 NOTE — Telephone Encounter (Signed)
R & L Heart Cath - pending

## 2011-11-19 NOTE — Telephone Encounter (Signed)
Pt has Medicare and [employer] supplement.  No precert required for heart cath.

## 2011-11-19 NOTE — Progress Notes (Addendum)
HPI: Patient presents for early scheduled followup. When last seen, I ordered a 2-D echo for reassessment of LVF, which yielded EF 45-50%, with multiple WMAs, mild RVD, mild AS, mild/moderate AR. Moderate PHTN (RVSP 54 mmHg).  Patient had normal coronaries; EF 50-55%, by R./L. cardiac catheterization, 5/12.  Clinically, he continues to report no exertional CP, and states that he has never had any. He is limited by significant DOE. He denies any symptoms suggestive of decompensated heart failure.  Allergies  Allergen Reactions  . Lisinopril Swelling    Angioedema  . Aspirin     Current Outpatient Prescriptions  Medication Sig Dispense Refill  . albuterol (PROAIR HFA) 108 (90 BASE) MCG/ACT inhaler Inhale 2 puffs into the lungs every 6 (six) hours as needed for wheezing or shortness of breath.  1 Inhaler  5  . albuterol (PROVENTIL) (2.5 MG/3ML) 0.083% nebulizer solution Take 3 mLs (2.5 mg total) by nebulization every 6 (six) hours as needed for wheezing or shortness of breath.  75 mL  6  . carvedilol (COREG) 25 MG tablet Take 1 tablet by mouth twice per day.       . flunisolide (NASALIDE) 0.025 % SOLN Place 2 sprays into the nose daily.       . furosemide (LASIX) 40 MG tablet 1/2 tablet daily as needed       . Multiple Vitamins-Minerals (CENTRUM SILVER PO) Take 1 tablet by mouth daily.        Marland Kitchen tiotropium (SPIRIVA) 18 MCG inhalation capsule Place 1 capsule (18 mcg total) into inhaler and inhale daily.  30 capsule  6  . warfarin (COUMADIN) 5 MG tablet Take daily by mouth as directed by the [facility] (Dr. [name])        Past Medical History  Diagnosis Date  . COPD (chronic obstructive pulmonary disease)     Chronic DOE  . Pulmonary embolism     Chronic Coumadin Therapy  . Nonischemic cardiomyopathy     Mild-EF 45-50%  . CHB (complete heart block)     S/P PTVP of CRT device and ICD upgrade in 2008  . Tobacco abuse   . Hypercholesterolemia   . Peripheral neuropathy   .  Hypertension   . Arthritis   . Allergic rhinitis   . Prostate cancer     Past Surgical History  Procedure Date  . Prostatectomy   . Cardiac defibrillator placement 2008    St. Jude Upgraded (2008)    History   Social History  . Marital Status: Married    Spouse Name: N/A    Number of Children: N/A  . Years of Education: N/A   Occupational History  . 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 Smoking status: Former Smoker -- 0.5 packs/day for 52 years    Types: Cigarettes    Quit date: 05/26/1993  . Smokeless tobacco: Former Neurosurgeon    Types: Chew    Quit date: 05/26/1990   Comment: chewed tobacco 40 years less than a pack/day  . Alcohol Use: No     denies alcohol use  . Drug Use: No  . Sexually Active: Not on file   Other Topics Concern  . Not on file   Social History Narrative  . No narrative on file    Family History  Problem Relation Age of Onset  . Hypertension Mother   . Heart failure Father   . Stroke    . Asthma      ROS:  no nausea, vomiting; no fever, chills; no melena, hematochezia; no claudication  PHYSICAL EXAM: BP 127/73  Pulse 84  Resp 16  Ht 5\' 11"  (1.803 m)  Wt 164 lb (74.39 kg)  BMI 22.87 kg/m2 GENERAL: 78 year-old male, sitting upright; NAD  HEENT: NCAT, PERRLA, EOMI; sclera clear; no xanthelasma  NECK: palpable bilateral carotid pulses, no bruits; no JVD; no TM  LUNGS: Diminished breath sounds, but no crackles or wheezes  CARDIAC: RRR (S1, S2); no significant murmurs; no rubs or gallops  ABDOMEN: soft, non-tender; intact BS  EXTREMETIES: intact distal pulses; no significant peripheral edema  SKIN: warm/dry; no obvious rash/lesions  MUSCULOSKELETAL: no joint deformity  NEURO: no focal deficit; NL affect    EKG:   ASSESSMENT & PLAN:  DYSPNEA Following review with Dr. Diona Browner, plan is to proceed with repeat right/left cardiac catheterization. Although it is unlikely that he has since developed significant CAD,  we want to definitively rule this out. Moreover, we will reassess hemodynamics with right-sided heart pressures. When last studied, he had minimally elevated LVEDP with normal CO, and Dr. Gala Romney concluded that this was most likely related to chronic lung disease.  His recent repeat echo did suggest moderate PHTN (RVSP 54 mmHg); however, there was suggestion of multiple WMAs with mild LVD (EF 45-50%). Following the study, we'll have patient return to clinic for followup with Dr. Andee Lineman for further recommendations. Patient is to continue on current medication regimen.  Biventricular cardiac pacemaker  St Judes Follow with Dr. Hillis Range, as scheduled.  PULMONARY EMBOLISM, HX OF On chronic Coumadin, followed by Dr. [name]  COPD Followed by Dr. Marshell Garfinkel, [facility]   Addendum: Following discussion with Dr. Andee Lineman, plan is as follows. Rather than proceed with a repeat R./L. cardiac catheterization, for which he would require bridging with Lovenox, given history of recurrent PE, we will order a dobutamine stress Myoview, for risk stratification. In reviewing his cardiac catheterization of 5/12, we feel that it would be unlikely for him to have developed significant CAD. Therefore, if his stress test is negative, then plan is to repeat a right heart catheterization only, which can be done on Coumadin, with an INR of approximately 2.0, thus eliminating the need for Lovenox bridging. This was then assess for development of significant P I. HTN, which is more likely to have developed from one year ago, then that of significant CAD. Will also check a d-dimer to exclude recurrent thromboembolic disease, in the event that the patient's INR levels have not been consistently therapeutic.  On the other hand, if the stress Myoview result suggests ischemia, then we will need to reconsider and proceed with a repeat left heart catheterization, to rule out significant CAD. Again, however, we feel that  this outcome is unlikely, and that the patient's symptoms most likely are secondary to intrinsic pulmonary disease.

## 2011-11-21 ENCOUNTER — Telehealth: Payer: Self-pay | Admitting: *Deleted

## 2011-11-21 ENCOUNTER — Other Ambulatory Visit: Payer: Self-pay | Admitting: *Deleted

## 2011-11-21 ENCOUNTER — Encounter: Payer: Self-pay | Admitting: *Deleted

## 2011-11-21 DIAGNOSIS — R0602 Shortness of breath: Secondary | ICD-10-CM

## 2011-11-21 DIAGNOSIS — R7989 Other specified abnormal findings of blood chemistry: Secondary | ICD-10-CM

## 2011-11-21 DIAGNOSIS — Z86711 Personal history of pulmonary embolism: Secondary | ICD-10-CM

## 2011-11-21 NOTE — Telephone Encounter (Signed)
Awaiting faxed copy to route to MD.

## 2011-11-21 NOTE — Telephone Encounter (Signed)
Please arrange for a CT angiogram of the chest (pulmonary embolus protocol)

## 2011-11-22 ENCOUNTER — Telehealth: Payer: Self-pay | Admitting: Pulmonary Disease

## 2011-11-22 DIAGNOSIS — G4734 Idiopathic sleep related nonobstructive alveolar hypoventilation: Secondary | ICD-10-CM

## 2011-11-22 DIAGNOSIS — J9611 Chronic respiratory failure with hypoxia: Secondary | ICD-10-CM | POA: Insufficient documentation

## 2011-11-22 NOTE — Telephone Encounter (Signed)
RA ONO 11/13/11 >> Test time 8 hrs 41 min.  Average SpO2 92.6%, low SpO2 85%.  Spent 13 min with SpO2 < 88%.  Will have my nurse inform patient that oxygen test showed oxygen was low while asleep.  This is likely related to his COPD.  He needs to start 1 liter oxygen at night, and repeat ONO with this set up.

## 2011-11-24 NOTE — Telephone Encounter (Signed)
Patient notified of below.  Will put order in & notify [facility]).

## 2011-11-24 NOTE — Telephone Encounter (Signed)
I spoke with patient about results and he verbalized understanding and had no questions. Pt aware order has been faxed over to his dme. He voiced his understanding and had no questions

## 2011-11-25 ENCOUNTER — Telehealth: Payer: Self-pay | Admitting: Pulmonary Disease

## 2011-11-25 NOTE — Telephone Encounter (Signed)
Pt returned call. [name] [name]  

## 2011-11-25 NOTE — Telephone Encounter (Signed)
lmomtcb x1 for pt 

## 2011-11-26 ENCOUNTER — Encounter: Payer: Self-pay | Admitting: Physician Assistant

## 2011-11-26 ENCOUNTER — Other Ambulatory Visit: Payer: Self-pay | Admitting: Physician Assistant

## 2011-11-26 ENCOUNTER — Inpatient Hospital Stay (HOSPITAL_COMMUNITY)
Admission: AD | Admit: 2011-11-26 | Discharge: 2011-12-01 | DRG: 189 | Disposition: A | Discharge status: Home / Self Care | Payer: Medicare Other | Source: Other Acute Inpatient Hospital | Attending: Cardiology | Admitting: Cardiology

## 2011-11-26 ENCOUNTER — Encounter (HOSPITAL_COMMUNITY): Payer: Self-pay | Admitting: *Deleted

## 2011-11-26 DIAGNOSIS — R0902 Hypoxemia: Secondary | ICD-10-CM | POA: Diagnosis present

## 2011-11-26 DIAGNOSIS — I7101 Dissection of ascending aorta: Secondary | ICD-10-CM | POA: Diagnosis present

## 2011-11-26 DIAGNOSIS — G609 Hereditary and idiopathic neuropathy, unspecified: Secondary | ICD-10-CM | POA: Diagnosis present

## 2011-11-26 DIAGNOSIS — I71019 Dissection of thoracic aorta, unspecified: Secondary | ICD-10-CM | POA: Diagnosis present

## 2011-11-26 DIAGNOSIS — R0609 Other forms of dyspnea: Secondary | ICD-10-CM

## 2011-11-26 DIAGNOSIS — Z7901 Long term (current) use of anticoagulants: Secondary | ICD-10-CM

## 2011-11-26 DIAGNOSIS — I428 Other cardiomyopathies: Secondary | ICD-10-CM | POA: Diagnosis present

## 2011-11-26 DIAGNOSIS — Z9079 Acquired absence of other genital organ(s): Secondary | ICD-10-CM

## 2011-11-26 DIAGNOSIS — E785 Hyperlipidemia, unspecified: Secondary | ICD-10-CM | POA: Diagnosis present

## 2011-11-26 DIAGNOSIS — E7800 Pure hypercholesterolemia, unspecified: Secondary | ICD-10-CM | POA: Diagnosis present

## 2011-11-26 DIAGNOSIS — I2782 Chronic pulmonary embolism: Secondary | ICD-10-CM | POA: Diagnosis present

## 2011-11-26 DIAGNOSIS — I442 Atrioventricular block, complete: Secondary | ICD-10-CM | POA: Diagnosis present

## 2011-11-26 DIAGNOSIS — J449 Chronic obstructive pulmonary disease, unspecified: Secondary | ICD-10-CM

## 2011-11-26 DIAGNOSIS — R7989 Other specified abnormal findings of blood chemistry: Secondary | ICD-10-CM

## 2011-11-26 DIAGNOSIS — Z95810 Presence of automatic (implantable) cardiac defibrillator: Secondary | ICD-10-CM

## 2011-11-26 DIAGNOSIS — R0602 Shortness of breath: Secondary | ICD-10-CM

## 2011-11-26 DIAGNOSIS — I429 Cardiomyopathy, unspecified: Secondary | ICD-10-CM

## 2011-11-26 DIAGNOSIS — IMO0002 Reserved for concepts with insufficient information to code with codable children: Secondary | ICD-10-CM

## 2011-11-26 DIAGNOSIS — Z79899 Other long term (current) drug therapy: Secondary | ICD-10-CM

## 2011-11-26 DIAGNOSIS — Z825 Family history of asthma and other chronic lower respiratory diseases: Secondary | ICD-10-CM

## 2011-11-26 DIAGNOSIS — J441 Chronic obstructive pulmonary disease with (acute) exacerbation: Secondary | ICD-10-CM | POA: Diagnosis present

## 2011-11-26 DIAGNOSIS — Z86718 Personal history of other venous thrombosis and embolism: Secondary | ICD-10-CM

## 2011-11-26 DIAGNOSIS — I272 Pulmonary hypertension, unspecified: Secondary | ICD-10-CM

## 2011-11-26 DIAGNOSIS — J9620 Acute and chronic respiratory failure, unspecified whether with hypoxia or hypercapnia: Principal | ICD-10-CM | POA: Diagnosis present

## 2011-11-26 DIAGNOSIS — I2789 Other specified pulmonary heart diseases: Secondary | ICD-10-CM | POA: Diagnosis present

## 2011-11-26 DIAGNOSIS — Z950 Presence of cardiac pacemaker: Secondary | ICD-10-CM

## 2011-11-26 DIAGNOSIS — I10 Essential (primary) hypertension: Secondary | ICD-10-CM | POA: Diagnosis present

## 2011-11-26 DIAGNOSIS — Z86711 Personal history of pulmonary embolism: Secondary | ICD-10-CM

## 2011-11-26 DIAGNOSIS — Z8546 Personal history of malignant neoplasm of prostate: Secondary | ICD-10-CM

## 2011-11-26 DIAGNOSIS — R0989 Other specified symptoms and signs involving the circulatory and respiratory systems: Secondary | ICD-10-CM

## 2011-11-26 DIAGNOSIS — Z8249 Family history of ischemic heart disease and other diseases of the circulatory system: Secondary | ICD-10-CM

## 2011-11-26 DIAGNOSIS — Z87891 Personal history of nicotine dependence: Secondary | ICD-10-CM

## 2011-11-26 HISTORY — DX: Dissection of unspecified site of aorta: I71.00

## 2011-11-26 HISTORY — DX: Pulmonary hypertension, unspecified: I27.20

## 2011-11-26 LAB — MRSA PCR SCREENING: MRSA by PCR: NEGATIVE

## 2011-11-26 MED ORDER — FLUTICASONE PROPIONATE 50 MCG/ACT NA SUSP
2.0000 | Freq: Every day | NASAL | Status: DC
  Administered 2011-11-27 – 2011-12-01 (×5): 2 via NASAL
  Filled 2011-11-26: qty 16

## 2011-11-26 MED ORDER — TIOTROPIUM BROMIDE MONOHYDRATE 18 MCG IN CAPS
18.0000 ug | ORAL_CAPSULE | Freq: Every day | RESPIRATORY_TRACT | Status: DC
  Filled 2011-11-26: qty 5

## 2011-11-26 MED ORDER — ALBUTEROL SULFATE (5 MG/ML) 0.5% IN NEBU: 2.5000 mg | INHALATION_SOLUTION | RESPIRATORY_TRACT | Status: DC

## 2011-11-26 MED ORDER — SODIUM CHLORIDE 0.9 % IJ SOLN
3.0000 mL | Freq: Two times a day (BID) | INTRAMUSCULAR | Status: DC
  Administered 2011-11-26 – 2011-11-30 (×7): 3 mL via INTRAVENOUS

## 2011-11-26 MED ORDER — CARVEDILOL 12.5 MG PO TABS
12.5000 mg | ORAL_TABLET | Freq: Two times a day (BID) | ORAL | Status: DC
  Administered 2011-11-27 – 2011-12-01 (×9): 12.5 mg via ORAL
  Filled 2011-11-26 (×11): qty 1

## 2011-11-26 MED ORDER — ALBUTEROL SULFATE HFA 108 (90 BASE) MCG/ACT IN AERS: 2.0000 | INHALATION_SPRAY | Freq: Four times a day (QID) | RESPIRATORY_TRACT | Status: DC | PRN

## 2011-11-26 MED ORDER — ADULT MULTIVITAMIN W/MINERALS CH
1.0000 | ORAL_TABLET | Freq: Every day | ORAL | Status: DC
  Administered 2011-11-27 – 2011-12-01 (×5): 1 via ORAL
  Filled 2011-11-26 (×5): qty 1

## 2011-11-26 MED ORDER — FUROSEMIDE 20 MG PO TABS
20.0000 mg | ORAL_TABLET | Freq: Every day | ORAL | Status: DC
  Administered 2011-11-27 – 2011-12-01 (×5): 20 mg via ORAL
  Filled 2011-11-26 (×5): qty 1

## 2011-11-26 MED ORDER — FLUNISOLIDE 25 MCG/ACT (0.025%) NA SOLN: 2.0000 | Freq: Every day | NASAL | Status: DC

## 2011-11-26 MED ORDER — ALBUTEROL SULFATE (5 MG/ML) 0.5% IN NEBU
INHALATION_SOLUTION | RESPIRATORY_TRACT | Status: AC
  Filled 2011-11-26: qty 0.5

## 2011-11-26 MED ORDER — ALBUTEROL SULFATE (5 MG/ML) 0.5% IN NEBU
2.5000 mg | INHALATION_SOLUTION | Freq: Four times a day (QID) | RESPIRATORY_TRACT | Status: DC
  Administered 2011-11-26 – 2011-11-28 (×7): 2.5 mg via RESPIRATORY_TRACT
  Filled 2011-11-26 (×7): qty 0.5

## 2011-11-26 MED ORDER — ENOXAPARIN SODIUM 40 MG/0.4ML ~~LOC~~ SOLN: 40.0000 mg | SUBCUTANEOUS | Status: DC

## 2011-11-26 NOTE — H&P (Addendum)
History and Physical   Admit date: 11/26/2011 Name:  [name] [name] Medical record number: [id number] DOB/Age:  01/19/1928  78 y.o.  Primary Cardiologist: Dr. Michelle Piper Deghent  Chief complaint/reason for admission: Dyspnea  HPI:  This 78 year old male has a history of a nonischemic cardiomyopathy and has a previous biventricular pacemaker that has been followed. He also has significant underlying lung disease and had a cardiopulmonary fitness test that was limited due to exertional hypoxemia suggesting pulmonary limitation. Catheterization in May of 2012 did not show significant coronary artery disease and had mild/moderate pulmonary hypertension at the time. He evidently was hospitalized in March at [facility] with pneumonia and has complained of severe dyspnea since then. He has been followed in both the cardiology clinic as well as the pulmonary clinic. His echocardiogram done recently showed an ejection fraction of 45-50% and a suggestion of more severe pulmonary hypertension. Initially a catheterization was recommended but then this was deferred and the thought was that he would have a dobutamine stress test to exclude ischemia. He had an elevated d-dimer and a CT scan was ordered last week but was done today that showed the presence of a focal dissection and the aorta as well as a small pulmonary embolus. He was thus transferred here.  I went over these films with the radiologist and these findings were present on a previous CT scan earlier. The patient has absolutely no chest pain. He complains of significant dyspnea but has no PND orthopnea. He has occasional edema. He was complaining of shortness of breath and was wheezing at the time of the examination. He has not had any fever or recent cough productive of yellow sputum   Past Medical History  Diagnosis Date  . COPD (chronic obstructive pulmonary disease)     Chronic DOE  . Pulmonary embolism     Chronic Coumadin Therapy  . Nonischemic  cardiomyopathy     Mild-EF 45-50%  . CHB (complete heart block)     S/P PTVP of CRT device and ICD upgrade in 2008  . Tobacco abuse   . Hypercholesterolemia   . Peripheral neuropathy   . Hypertension   . Arthritis   . Allergic rhinitis   . Prostate cancer      Past Surgical History  Procedure Date  . Prostatectomy   . Cardiac defibrillator placement 2008    St. Jude Upgraded (2008)  .  Allergies: is allergic to lisinopril and aspirin.   Medications: Prior to Admission medications   Medication Sig Start Date End Date Taking? Authorizing Provider  albuterol (PROAIR HFA) 108 (90 BASE) MCG/ACT inhaler Inhale 2 puffs into the lungs every 6 (six) hours as needed for wheezing or shortness of breath. 11/10/11 11/09/12 Yes Coralyn Helling, MD  albuterol (PROVENTIL) (2.5 MG/3ML) 0.083% nebulizer solution Take 3 mLs (2.5 mg total) by nebulization every 6 (six) hours as needed for wheezing or shortness of breath. 05/14/11  Yes Coralyn Helling, MD  carvedilol (COREG) 25 MG tablet Take 1 tablet by mouth twice per day.    Yes Historical Provider, MD  flunisolide (NASALIDE) 0.025 % SOLN Place 2 sprays into the nose daily.    Yes Historical Provider, MD  furosemide (LASIX) 40 MG tablet Take 20 mg by mouth daily as needed. For excess fluid retention   Yes Historical Provider, MD  Multiple Vitamin (MULTIVITAMIN WITH MINERALS) TABS Take 1 tablet by mouth daily.   Yes Historical Provider, MD  tiotropium (SPIRIVA) 18 MCG inhalation capsule Place 1 capsule (18  mcg total) into inhaler and inhale daily. 11/10/11 11/09/12 Yes Coralyn Helling, MD  warfarin (COUMADIN) 5 MG tablet Take 7.5-10 mg by mouth daily. Take 7.5 mg daily except for Tuesday take 10 mg   Yes Historical Provider, MD   Family History:  family history includes Asthma in an unspecified family member; Heart failure in his father; Hypertension in his mother; and Stroke in an unspecified family member.  Social History:   reports that he quit smoking about 18  years ago. His smoking use included Cigarettes. He has a 26 pack-year smoking history. He quit smokeless tobacco use about 21 years ago. His smokeless tobacco use included Chew. He reports that he does not drink alcohol or use illicit drugs.   History   Social History Narrative   Retired Surveyor, minerals.  Lives with wife of 61 years.     Review of Systems: His weight is been stable. He has significant dyspnea that is limiting to him. He has had absolutely no chest pain. He has had some mild constipation. He has some urinary nocturia as well as some mild incontinence. He complains of arthritis involving his knees and has occasional mild edema. Other than as noted above, the remainder of the review of systems is normal  Physical Exam: BP 134/61  Pulse 85  Temp 98 F (36.7 C) (Oral)  Resp 22  Ht 5\' 11"  (1.803 m)  Wt 70.2 kg (154 lb 12.2 oz)  BMI 21.59 kg/m2  SpO2 98% General appearance: alert, cooperative, appears stated age and mild distress Head: Normocephalic, without obvious abnormality, atraumatic Eyes: conjunctivae/corneas clear. PERRL, EOM's intact. Fundi benign. Neck: no adenopathy, no carotid bruit, no JVD and supple, symmetrical, trachea midline Lungs: Diffuse expiratory wheezing bilaterally with poor air movement, increased AP diameter Heart: regular rate and rhythm, S1, S2 normal, no murmur, click, rub or gallop Abdomen: soft, non-tender; bowel sounds normal; no masses,  no organomegaly Rectal: deferred Extremities: 1+ edema Pulses: 2+ and symmetric Neurologic: Grossly normal  Labs: (all from [place] and not in EPIC) White count 5200 hemoglobin 12.8, hematocrit 44, platelet count 163,000 INR is 1.3 which was subtherapeutic troponin 0.02 CPK MB 1.8 BUN 21, creatinine 1.17 glucose 99 sodium 140 potassium 4.2 chloride 105, CO2 30 liver enzymes normal blood gas pH 7.39 PCO2 48 PO2 107   EKG: AV paced rhythm  Radiology: CT scan reviewed with radiologist and compared to  previous CT scan in May with radiologist. There is localized aortic dissection the ascending aorta with slight intermural hematoma but appeared also present on the previous CT scan in May. Likewise a small pulmonary embolus that is seen is reported as being present in May also.   IMPRESSIONS:  1. Severe dyspnea likely multifactorial. He has active wheezing. He also has a history of chronic pulmonary emboli and has a subtherapeutic INR. 2. History of pulmonary emboli 3. Localized aortic dissection which is chronic according to radiologist, not associated with chest pain or hemodynamic compromise 4. Biventricular pacemaker 5. Severe underlying COPD 6. History of pulmonary hypertension 7. History of prostate cancer with previous prostatectomy 8. Peripheral neuropathy  PLAN: The patient has a subtherapeutic INR. I reviewed the CT scan with radiologist and the aortic dissection was present previously in May. It appears to be localized and has a small hematoma.. He has a small pulmonary embolus also noted and has a subtherapeutic INR. He is actively wheezing. I think that he needs to have a pulmonary consultation. I discussed with the thoracic surgeon tonight about  aortic dissection and he will see the patient in the morning since he is not having any hemodynamic compromise or active chest pain. No current indications for surgery.   Since his INR is subtherapeutic, we will go ahead and repeat that and place him on a conservative dose of heparin through the night. Repeat d-dimer as well as BNP level.   Signed: Darden Palmer MD [facility] Cardiology  11/26/2011, 11:19 PM

## 2011-11-26 NOTE — Telephone Encounter (Signed)
lmomtcb  

## 2011-11-27 DIAGNOSIS — J4489 Other specified chronic obstructive pulmonary disease: Secondary | ICD-10-CM

## 2011-11-27 DIAGNOSIS — R0989 Other specified symptoms and signs involving the circulatory and respiratory systems: Secondary | ICD-10-CM

## 2011-11-27 DIAGNOSIS — J449 Chronic obstructive pulmonary disease, unspecified: Secondary | ICD-10-CM

## 2011-11-27 DIAGNOSIS — I7101 Dissection of thoracic aorta: Secondary | ICD-10-CM

## 2011-11-27 DIAGNOSIS — R0902 Hypoxemia: Secondary | ICD-10-CM

## 2011-11-27 DIAGNOSIS — J441 Chronic obstructive pulmonary disease with (acute) exacerbation: Secondary | ICD-10-CM

## 2011-11-27 DIAGNOSIS — Z86718 Personal history of other venous thrombosis and embolism: Secondary | ICD-10-CM

## 2011-11-27 DIAGNOSIS — R0609 Other forms of dyspnea: Secondary | ICD-10-CM

## 2011-11-27 DIAGNOSIS — I2789 Other specified pulmonary heart diseases: Secondary | ICD-10-CM

## 2011-11-27 DIAGNOSIS — R0602 Shortness of breath: Secondary | ICD-10-CM

## 2011-11-27 LAB — COMPREHENSIVE METABOLIC PANEL WITH GFR
ALT: 7 U/L (ref 0–53)
AST: 16 U/L (ref 0–37)
Albumin: 3.2 g/dL — ABNORMAL LOW (ref 3.5–5.2)
Alkaline Phosphatase: 68 U/L (ref 39–117)
BUN: 18 mg/dL (ref 6–23)
CO2: 28 meq/L (ref 19–32)
Calcium: 9.3 mg/dL (ref 8.4–10.5)
Chloride: 105 meq/L (ref 96–112)
Creatinine, Ser: 1.09 mg/dL (ref 0.50–1.35)
GFR calc Af Amer: 71 mL/min — ABNORMAL LOW
GFR calc non Af Amer: 62 mL/min — ABNORMAL LOW
Glucose, Bld: 113 mg/dL — ABNORMAL HIGH (ref 70–99)
Potassium: 4.1 meq/L (ref 3.5–5.1)
Sodium: 142 meq/L (ref 135–145)
Total Bilirubin: 0.4 mg/dL (ref 0.3–1.2)
Total Protein: 6.1 g/dL (ref 6.0–8.3)

## 2011-11-27 LAB — DIFFERENTIAL
Basophils Absolute: 0 10*3/uL (ref 0.0–0.1)
Basophils Relative: 1 % (ref 0–1)
Eosinophils Absolute: 0.4 10*3/uL (ref 0.0–0.7)
Eosinophils Relative: 8 % — ABNORMAL HIGH (ref 0–5)
Lymphocytes Relative: 26 % (ref 12–46)
Lymphs Abs: 1.3 10*3/uL (ref 0.7–4.0)
Monocytes Absolute: 0.6 10*3/uL (ref 0.1–1.0)
Monocytes Relative: 12 % (ref 3–12)
Neutro Abs: 2.7 10*3/uL (ref 1.7–7.7)
Neutrophils Relative %: 53 % (ref 43–77)

## 2011-11-27 LAB — PRO B NATRIURETIC PEPTIDE: Pro B Natriuretic peptide (BNP): 385.6 pg/mL (ref 0–450)

## 2011-11-27 LAB — PROTIME-INR
INR: 1.5 — ABNORMAL HIGH (ref 0.00–1.49)
INR: 1.62 — ABNORMAL HIGH (ref 0.00–1.49)
Prothrombin Time: 18.4 s — ABNORMAL HIGH (ref 11.6–15.2)

## 2011-11-27 MED ORDER — METHYLPREDNISOLONE SODIUM SUCC 125 MG IJ SOLR
60.0000 mg | Freq: Two times a day (BID) | INTRAMUSCULAR | Status: DC
  Administered 2011-11-27 – 2011-11-28 (×3): 60 mg via INTRAVENOUS
  Filled 2011-11-27 (×4): qty 0.96
  Filled 2011-11-27: qty 2

## 2011-11-27 MED ORDER — ALBUTEROL SULFATE (5 MG/ML) 0.5% IN NEBU
2.5000 mg | INHALATION_SOLUTION | RESPIRATORY_TRACT | Status: DC | PRN
  Administered 2011-11-27 – 2011-11-30 (×3): 2.5 mg via RESPIRATORY_TRACT
  Filled 2011-11-27 (×3): qty 0.5

## 2011-11-27 MED ORDER — IPRATROPIUM BROMIDE 0.02 % IN SOLN
0.5000 mg | Freq: Four times a day (QID) | RESPIRATORY_TRACT | Status: DC
  Administered 2011-11-27 – 2011-11-28 (×6): 0.5 mg via RESPIRATORY_TRACT
  Filled 2011-11-27 (×6): qty 2.5

## 2011-11-27 MED ORDER — HEPARIN (PORCINE) IN NACL 100-0.45 UNIT/ML-% IJ SOLN
850.0000 [IU]/h | INTRAMUSCULAR | Status: DC
  Administered 2011-11-27: 1000 [IU]/h via INTRAVENOUS
  Administered 2011-11-27 – 2011-11-28 (×2): 850 [IU]/h via INTRAVENOUS
  Filled 2011-11-27 (×3): qty 250

## 2011-11-27 MED ORDER — IPRATROPIUM BROMIDE 0.02 % IN SOLN
0.5000 mg | RESPIRATORY_TRACT | Status: DC | PRN
  Administered 2011-11-27 – 2011-11-30 (×2): 0.5 mg via RESPIRATORY_TRACT
  Filled 2011-11-27 (×2): qty 2.5

## 2011-11-27 MED ORDER — WARFARIN - PHARMACIST DOSING INPATIENT
Freq: Every day | Status: DC
  Administered 2011-11-28 – 2011-11-30 (×3)

## 2011-11-27 MED ORDER — WARFARIN SODIUM 10 MG PO TABS
10.0000 mg | ORAL_TABLET | Freq: Once | ORAL | Status: AC
  Filled 2011-11-27: qty 1

## 2011-11-27 MED ORDER — DOXYCYCLINE HYCLATE 100 MG PO TABS
100.0000 mg | ORAL_TABLET | Freq: Two times a day (BID) | ORAL | Status: DC
  Administered 2011-11-27 – 2011-12-01 (×10): 100 mg via ORAL
  Filled 2011-11-27 (×11): qty 1

## 2011-11-27 NOTE — Progress Notes (Signed)
ANTICOAGULATION CONSULT NOTE - Follow Up Consult  Pharmacy Consult for Heparin Indication: New PE, hx PE with subtherapeutic INR  Allergies  Allergen Reactions  . Lisinopril Swelling    Angioedema  . Aspirin Nausea And Vomiting   Vital Signs: Temp: 98.5 F (36.9 C) (07/04 0725) Temp src: Oral (07/04 0725) BP: 138/77 mmHg (07/04 0725) Pulse Rate: 85  (07/04 0725)  Labs:  Alvira Philips 11/27/11 0953 11/27/11 0615 11/27/11 0053  HGB -- -- --  HCT -- -- --  PLT -- -- --  APTT -- -- --  LABPROT -- 19.5* 18.4*  INR -- 1.62* 1.50*  HEPARINUNFRC 0.34 -- --  CREATININE -- 1.09 --  CKTOTAL -- -- --  CKMB -- -- --  TROPONINI -- -- --    Estimated Creatinine Clearance: 53.5 ml/min (by C-G formula based on Cr of 1.09).   Medications:  Heparin @ 1000 units/hr  Assessment: 83yom on coumadin pta for hx PE, admitted with a subtherapeutic INR, and found to have a new PE (per CCM may not be acute) as well as stable aortic dissection. Coumadin has been continued and he is now bridging with heparin. Initial heparin level is thearpeutic. No bleeding per chart notes. CBC stable.  Goal of Therapy:  Heparin level 0.3-0.5 INR 2-3 Monitor platelets by anticoagulation protocol: Yes   Plan:  1) Continue heparin at 1000 units/hr 2) Heparin level in 8 hours to confirm appropriate rate  Fredrik Rigger 11/27/2011,11:36 AM

## 2011-11-27 NOTE — Consult Note (Signed)
[address]            [place] [address]          [phone]       ROLLYN SCIALDONE [facility] Medical Record #[id number] Date of Birth: 1927/11/28  Referring: No ref. provider found Primary Care: Kirstie Peri, MD  Chief Complaint:   No chief complaint on file.  increasing dyspnea on exertion, history of COPD and pulmonary embolus  History of Present Illness:     78 year old black male with severe COPD, FEV1 0.90 L, DLCO 40% predicted on home O2 with history of chronic PE admitted with dyspnea on exertion and minimally elevated BNP. Right and left heart cath performed last year by Dr.Bensimhon  Demonstrated normal coronaries, PA pressures 40/20, mixed venous sat 65%. Echocardiogram shows EF 45% with mild to moderate AI. The patient has had multiple CT angiogram is which have shown pulmonary emboli for which he is on chronic Coumadin therapy. The last CTA performed at [facility] was reported as having a short segment ascending aortic dissection. I reviewed the films and feel this is a possible artifact versus a small localized penetrating ulcer as the only abnormality is seen on a nonenhanced scan of the aorta, the thoracic aorta does have significant atherosclerotic disease.. There is a filling defect in the left lower lobe pulmonary artery consistent with pulmonary embolus. The patient has not had any chest pain back pain or other symptoms of dissection. Because of exertional dyspnea the patient was in the process of being reevaluated for coronary ischemia with a Myoview and a possible cardiac catheterization. A contemporary 2-D echo is pending.   Current Activity/ Functional Status: Significant shortness of breath with exertion not at rest   Past Medical History  Diagnosis Date  . COPD (chronic obstructive pulmonary disease)     Chronic DOE  . Pulmonary embolism     Chronic Coumadin Therapy  . Nonischemic cardiomyopathy     Mild-EF 45-50%  . CHB  (complete heart block)     S/P PTVP of CRT device and ICD upgrade in 2008  . Tobacco abuse   . Hypercholesterolemia   . Peripheral neuropathy   . Hypertension   . Arthritis   . Allergic rhinitis   . Prostate cancer     Past Surgical History  Procedure Date  . Prostatectomy   . Cardiac defibrillator placement 2008    St. Jude Upgraded (2008)    History  Smoking status  . Former Smoker -- 0.5 packs/day for 52 years  . Types: Cigarettes  . Quit date: 05/26/1993  Smokeless tobacco  . Former Neurosurgeon  . Types: Chew  . Quit date: 05/26/1990  Comment: chewed tobacco 40 years less than a pack/day    History  Alcohol Use No    denies alcohol use    History   Social History  . Marital Status: Married    Spouse Name: N/A    Number of Children: N/A  . Years of Education: N/A   Occupational History  . 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 Smoking status: Former Smoker -- 0.5 packs/day for 52 years    Types: Cigarettes    Quit date: 05/26/1993  . Smokeless tobacco: Former Neurosurgeon    Types: Chew    Quit date: 05/26/1990   Comment: chewed tobacco 40 years less than a pack/day  .  Alcohol Use: No     denies alcohol use  . Drug Use: No  . Sexually Active: Not on file   Other Topics Concern  . Not on file   Social History Narrative   Retired Surveyor, minerals.  Lives with wife of 61 years.    Allergies  Allergen Reactions  . Lisinopril Swelling    Angioedema  . Aspirin Nausea And Vomiting    Current Facility-Administered Medications  Medication Dose Route Frequency Provider Last Rate Last Dose  . albuterol (PROVENTIL) (5 MG/ML) 0.5% nebulizer solution 2.5 mg  2.5 mg Nebulization Q6H Ok Anis, NP   2.5 mg at 11/27/11 0157  . ipratropium (ATROVENT) nebulizer solution 0.5 mg  0.5 mg Nebulization Q2H PRN Lupita Leash, MD   0.5 mg at 11/27/11 0553   And  . albuterol (PROVENTIL) (5 MG/ML) 0.5% nebulizer solution 2.5 mg  2.5 mg Nebulization Q2H  PRN Lupita Leash, MD   2.5 mg at 11/27/11 0553  . albuterol (PROVENTIL) (5 MG/ML) 0.5% nebulizer solution           . carvedilol (COREG) tablet 12.5 mg  12.5 mg Oral BID WC Othella Boyer, MD   12.5 mg at 11/27/11 0836  . doxycycline (VIBRA-TABS) tablet 100 mg  100 mg Oral Q12H Lupita Leash, MD   100 mg at 11/27/11 0316  . fluticasone (FLONASE) 50 MCG/ACT nasal spray 2 spray  2 spray Each Nare Daily Gaylord Shih, MD      . furosemide (LASIX) tablet 20 mg  20 mg Oral Daily Othella Boyer, MD      . heparin ADULT infusion 100 units/mL (25000 units/250 mL)  1,000 Units/hr Intravenous Continuous Gaylord Shih, MD 10 mL/hr at 11/27/11 0205 1,000 Units/hr at 11/27/11 0205  . ipratropium (ATROVENT) nebulizer solution 0.5 mg  0.5 mg Nebulization Q6H Lupita Leash, MD   0.5 mg at 11/27/11 0229  . methylPREDNISolone sodium succinate (SOLU-MEDROL) 125 mg/2 mL injection 60 mg  60 mg Intravenous Q12H Lupita Leash, MD   60 mg at 11/27/11 0230  . multivitamin with minerals tablet 1 tablet  1 tablet Oral Daily Othella Boyer, MD      . sodium chloride 0.9 % injection 3 mL  3 mL Intravenous Q12H Othella Boyer, MD   3 mL at 11/26/11 2345  . warfarin (COUMADIN) tablet 10 mg  10 mg Oral ONCE-1800 Gaylord Shih, MD      . Warfarin - Pharmacist Dosing Inpatient   Does not apply q1800 Gaylord Shih, MD      . DISCONTD: albuterol (PROVENTIL HFA;VENTOLIN HFA) 108 (90 BASE) MCG/ACT inhaler 2 puff  2 puff Inhalation Q6H PRN Othella Boyer, MD      . DISCONTD: albuterol (PROVENTIL) (5 MG/ML) 0.5% nebulizer solution 2.5 mg  2.5 mg Nebulization Q4H Othella Boyer, MD      . DISCONTD: enoxaparin (LOVENOX) injection 40 mg  40 mg Subcutaneous Q24H Othella Boyer, MD      . DISCONTD: flunisolide (NASALIDE) 0.025 % nasal spray 2 spray  2 spray Nasal Daily Othella Boyer, MD      . DISCONTD: tiotropium [facility]) inhalation capsule 18 mcg  18 mcg Inhalation Daily Othella Boyer, MD         Prescriptions prior to admission  Medication Sig Dispense Refill  . albuterol (PROAIR HFA) 108 (90 BASE) MCG/ACT inhaler Inhale 2 puffs into the lungs every 6 (six) hours as  needed for wheezing or shortness of breath.  1 Inhaler  5  . albuterol (PROVENTIL) (2.5 MG/3ML) 0.083% nebulizer solution Take 3 mLs (2.5 mg total) by nebulization every 6 (six) hours as needed for wheezing or shortness of breath.  75 mL  6  . carvedilol (COREG) 25 MG tablet Take 1 tablet by mouth twice per day.       . flunisolide (NASALIDE) 0.025 % SOLN Place 2 sprays into the nose daily.       . furosemide (LASIX) 40 MG tablet Take 20 mg by mouth daily as needed. For excess fluid retention      . Multiple Vitamin (MULTIVITAMIN WITH MINERALS) TABS Take 1 tablet by mouth daily.      Marland Kitchen tiotropium (SPIRIVA) 18 MCG inhalation capsule Place 1 capsule (18 mcg total) into inhaler and inhale daily.  30 capsule  6  . warfarin (COUMADIN) 5 MG tablet Take 7.5-10 mg by mouth daily. Take 7.5 mg daily except for Tuesday take 10 mg        Family History  Problem Relation Age of Onset  . Hypertension Mother   . Heart failure Father   . Stroke    . Asthma       Review of Systems: Status post AICD 9 years ago which has never activated.   Cardiac Review of Systems: Y or N  Chest Pain [ n   ]  Resting SOB [n   ] Exertional SOB  [ y ]  Pollyann Kennedy Milo.Brash ]   Pedal Edema [   ]    Palpitations [  ] Syncope  [  ]   Presyncope [   ]  General Review of Systems: [Y] = yes [  ]=no Constitional: recent weight change [  ]; anorexia [  ]; fatigue [  ]; nausea [  ]; night sweats [  ]; fever [  ]; or chills [  ];                                                                                                                                          Dental: poor dentition[  ]; Last Dentist visit: poor  Eye : blurred vision [  ]; diplopia [   ]; vision changes [  ];  Amaurosis fugax[  ]; Resp: cough [  ];  wheezing[  ];  hemoptysis[  ]; shortness of  breath[  ]; paroxysmal nocturnal dyspnea[  ]; dyspnea on exertion[  ]; or orthopnea[  ];  GI:  gallstones[  ], vomiting[  ];  dysphagia[  ]; melena[  ];  hematochezia [  ]; heartburn[  ];   Hx of  Colonoscopy[  ]; GU: kidney stones [  ]; hematuria[  ];   dysuria [  ];  nocturia[  ];  history of     obstruction [  ];  Skin: rash, swelling[  ];, hair loss[  ];  peripheral edema[  ];  or itching[  ]; Musculosketetal: myalgias[  ];  joint swelling[  ];  joint erythema[  ];  joint pain[  ];  back pain[  ];  Heme/Lymph: bruising[  ];  bleeding[  ];  anemia[  ];  Neuro: TIA[  ];  headaches[  ];  stroke[  ];  vertigo[  ];  seizures[  ];   paresthesias[  ];  difficulty walking[  ];  Psych:depression[  ]; anxiety[  ];  Endocrine: diabetes[  ];  thyroid dysfunction[  ];  Immunizations: Flu [  ]; Pneumococcal[  ];  Other:  Physical Exam: BP 138/77  Pulse 85  Temp 98.5 F (36.9 C) (Oral)  Resp 23  Ht 5\' 11"  (1.803 m)  Wt 156 lb 12 oz (71.1 kg)  BMI 21.86 kg/m2  SpO2 98%  Exam Alert and responsive comfortable at rest HEENT normocephalic Neck without JVD Breath sounds distant Cardiac rhythm paced a year no murmur Extremities warm well perfused nontender Neuro intact   Diagnostic Studies & Laboratory data:     Recent Radiology Findings:   Ct Angio Chest W/cm &/or Wo Cm  11/26/2011  Angio Chest w/wo contrast 785.05 Ordered for 11-26-2011 patient notified to go to the Hospital       Recent Lab Findings: Lab Results  Component Value Date   GLUCOSE 113* 11/27/2011   ALT 7 11/27/2011   AST 16 11/27/2011   NA 142 11/27/2011   K 4.1 11/27/2011   CL 105 11/27/2011   CREATININE 1.09 11/27/2011   BUN 18 11/27/2011   CO2 28 11/27/2011   INR 1.62* 11/27/2011      Assessment / Plan:      Questionable short segment dissection of the ascending aorta which is possibly a artifact on the CTA not previously reported. The patient is asymptomatic. Patient has severe COPD. The patient is 78 years old. I  would treat the CT scan findings conservatively. The patient is artery on a beta blocker-Corag. Careful anticoagulation due to his history of PE appears to be indicated. Will follow.      @me1 @ 11/27/2011 9:13 AM

## 2011-11-27 NOTE — Progress Notes (Signed)
Patient Name: [name] [name]      SUBJECTIVE: admitted with shortness of breath  CT [facility]>>small pe and aortic dissection,.  Data reviewed last night by DR [facility] with radiology and both findings old as of May 2013.  Pt seen by CCM>>very severe COPD and Rx starrted  CV surgery consult >>conserative management-- Dr PVT not even sure not artifact  SOB has been very severe since march when he said he had pneumonia   Past Medical History  Diagnosis Date  . COPD (chronic obstructive pulmonary disease)     Chronic DOE  . Pulmonary embolism     Chronic Coumadin Therapy  . Nonischemic cardiomyopathy     Mild-EF 45-50%  . CHB (complete heart block)     S/P PTVP of CRT device and ICD upgrade in 2008  . Tobacco abuse   . Hypercholesterolemia   . Peripheral neuropathy   . Hypertension   . Arthritis   . Allergic rhinitis   . Prostate cancer     PHYSICAL EXAM Filed Vitals:   11/27/11 0400 11/27/11 0500 11/27/11 0600 11/27/11 0725  BP: 135/70 146/74 141/72 138/77  Pulse: 85 86 85 85  Temp:    98.5 F (36.9 C)  TempSrc:    Oral  Resp: 21 22 20 23   Height:      Weight:  156 lb 12 oz (71.1 kg)    SpO2: 98% 97% 100% 98%    Well developed and nourished in no acute distress HENT normal Neck supple   Clear-surprisningly Regular rate and rhythm, 2/6 sys m Abd-soft with active BS No Clubbing cyanosis edema Skin-warm and dry A & Oriented  Grossly normal sensory and motor function  TELEMETRY: Reviewed telemetry pt in av pacing:    Intake/Output Summary (Last 24 hours) at 11/27/11 0911 Last data filed at 11/27/11 0600  Gross per 24 hour  Intake     50 ml  Output    350 ml  Net   -300 ml    LABS: Basic Metabolic Panel:  Lab 11/27/11 1610  NA 142  K 4.1  CL 105  CO2 28  GLUCOSE 113*  BUN 18  CREATININE 1.09  CALCIUM 9.3  MG --  PHOS --   Cardiac Enzymes: No results found for this basename: CKTOTAL:3,CKMB:3,CKMBINDEX:3,TROPONINI:3 in the last 72 hours CBC:  Lab  11/27/11 0053  WBC --  NEUTROABS 2.7  HGB --  HCT --  MCV --  PLT --   PROTIME:  Basename 11/27/11 0615 11/27/11 0053  LABPROT 19.5* 18.4*  INR 1.62* 1.50*   Liver Function Tests:  Basename 11/27/11 0615  AST 16  ALT 7  ALKPHOS 68  BILITOT 0.4  PROT 6.1  ALBUMIN 3.2*   No results found for this basename: LIPASE:2,AMYLASE:2 in the last 72 hours BNP: No components found with this basename: POCBNP:3 D-Dimer:  Alvira Philips 11/27/11 0053  DDIMER 2.00*   Hemoglobin A1C: No results found for this basename: HGBA1C in the last 72 hours Fasting Lipid Panel: No results found for this basename: CHOL,HDL,LDLCALC,TRIG,CHOLHDL,LDLDIRECT in the last 72 hours Thyroid Function Tests: No results found for this basename: TSH,T4TOTAL,FREET3,T3FREE,THYROIDAB in the last 72 hours Anemia Panel: No results found for this basename: VITAMINB12,FOLATE,FERRITIN,TIBC,IRON,RETICCTPCT in the last 72 hours   Device Interrogation:pending   ASSESSMENT AND PLAN:  Patient Active Hospital Problem List: DYSPNEA (02/21/2009)   History of pulmonary embolus (07/17/2009)   Dissection of ascending aorta (11/26/2011)   Acute exacerbation of chronic obstructive pulmonary disease (COPD) (11/27/2011)  Hypoxemia (11/27/2011)   There has been discussion about evaluation for CAD given echo 45-50<<50-55% w WMA,  But pt had neg cath in 5/12 and normalization of previous NICM. I am not inclined to pursue that diagnosis.     I think we need to follow pulm lead here;   Will discuss with colleagues the potential role of RHC,  For now continue hep until INR therapeutic; continue BB.     Signed, Sherryl Manges MD  11/27/2011

## 2011-11-27 NOTE — Progress Notes (Signed)
ANTICOAGULATION CONSULT NOTE - Initial Consult  Pharmacy Consult for Heparin and Coumadin Indication: New PE, h/o PE with subthepeutic INR  Allergies  Allergen Reactions  . Lisinopril Swelling    Angioedema  . Aspirin Nausea And Vomiting    Patient Measurements: Height: 5\' 11"  (180.3 cm) Weight: 154 lb 12.2 oz (70.2 kg) IBW/kg (Calculated) : 75.3   Vital Signs: Temp: 98.3 F (36.8 C) (07/03 2338) Temp src: Oral (07/03 2338) BP: 134/61 mmHg (07/03 2200) Pulse Rate: 85  (07/03 2200)  Labs (at [facility]): WBC 5.2 Hgb 12.8  Hct 40.4 Plt 163  INR 1.3  SCr 1.2  No results found for this basename: HGB:2,HCT:3,PLT:3,APTT:3,LABPROT:3,INR:3,HEPARINUNFRC:3,CREATININE:3,CKTOTAL:3,CKMB:3,TROPONINI:3 in the last 72 hours  CrCl is unknown because no creatinine reading has been taken.   Medical History: Past Medical History  Diagnosis Date  . COPD (chronic obstructive pulmonary disease)     Chronic DOE  . Pulmonary embolism     Chronic Coumadin Therapy  . Nonischemic cardiomyopathy     Mild-EF 45-50%  . CHB (complete heart block)     S/P PTVP of CRT device and ICD upgrade in 2008  . Tobacco abuse   . Hypercholesterolemia   . Peripheral neuropathy   . Hypertension   . Arthritis   . Allergic rhinitis   . Prostate cancer     Medications:  Prescriptions prior to admission  Medication Sig Dispense Refill  . albuterol (PROAIR HFA) 108 (90 BASE) MCG/ACT inhaler Inhale 2 puffs into the lungs every 6 (six) hours as needed for wheezing or shortness of breath.  1 Inhaler  5  . albuterol (PROVENTIL) (2.5 MG/3ML) 0.083% nebulizer solution Take 3 mLs (2.5 mg total) by nebulization every 6 (six) hours as needed for wheezing or shortness of breath.  75 mL  6  . carvedilol (COREG) 25 MG tablet Take 1 tablet by mouth twice per day.       . flunisolide (NASALIDE) 0.025 % SOLN Place 2 sprays into the nose daily.       . furosemide (LASIX) 40 MG tablet Take 20 mg by mouth daily  as needed. For excess fluid retention      . Multiple Vitamin (MULTIVITAMIN WITH MINERALS) TABS Take 1 tablet by mouth daily.      Marland Kitchen tiotropium (SPIRIVA) 18 MCG inhalation capsule Place 1 capsule (18 mcg total) into inhaler and inhale daily.  30 capsule  6  . warfarin (COUMADIN) 5 MG tablet Take 7.5-10 mg by mouth daily. Take 7.5 mg daily except for Tuesday take 10 mg        Assessment: 72 to male with h/o PE for anticoagulation  Goal of Therapy:  Heparin level 0.3-0.5 INR 2-3 Monitor platelets by anticoagulation protocol: Yes   Plan:  Start Heparin 1000 units/hr Check heparin level in 8 hours. Coumadin 10 mg po tonight  Eddie Candle 11/27/2011,12:09 AM

## 2011-11-27 NOTE — Consult Note (Signed)
Name: [name] [name] MRN: [id number] DOB: 11-20-1927  LOS: 1  CRITICAL CARE CONSULT NOTE  History of Present Illness: This is a 78 y/o male with GOLD stage IV COPD on home oxygen at night, pulmonary embolism, and pulmonary hypertension was admitted on 11/26/2011 to [facility] with shortness of breath.  He states that he has no sputum production, fever, or chest pain but has noted increasing wheezing and dyspnea for 2-3 days.  At an OSH he was found to have a PE and small aortic dissection so he was transferred here for further management.  No leg swelling noted.  Lines / Drains:   Cultures / Sepsis markers:   Antibiotics: 11/27/2011 doxycycline (AE COPD) >>  Tests / Events: 11/27/2011 CT chest (I am unable to review images) small left lower lobe sub-segmental pulmonary embolism, Stanford A aortic dissection with ulceration, severe emphysema     Vital Signs:   Filed Vitals:   11/27/11 0500 11/27/11 0600 11/27/11 0725 11/27/11 0941  BP: 146/74 141/72 138/77   Pulse: 86 85 85   Temp:   98.5 F (36.9 C)   TempSrc:   Oral   Resp: 22 20 23    Height:      Weight: 156 lb 12 oz (71.1 kg)     SpO2: 97% 100% 98% 100%  O2: 2 L [place]  Physical Examination: Gen: chronically ill appearing, mild resp distress, speaking in full sentences HEENT: NCAT, PERRL, EOMi, OP clear,  Neck: supple without masses PULM: poor air movement, marked wheezing bilaterally CV: distant tones AB: BS+, soft, nontender, no hsm Ext: warm, no edema, no clubbing, no cyanosis Derm: no rash or skin breakdown Neuro: A&Ox4, CN II-XII intact, strength 5/5 in all 4 extremities Psyche: Normal mood and affect  Labs and Imaging:   CBC    Component Value Date/Time   LYMPHSABS 1.3 11/27/2011 0053   MONOABS 0.6 11/27/2011 0053   EOSABS 0.4 11/27/2011 0053   BASOSABS 0.0 11/27/2011 0053    BMET    Component Value Date/Time   NA 142 11/27/2011 0615    ABG    Component Value Date/Time   PHART 7.385 10/04/2010 0853   PCO2ART 39.4  10/04/2010 0853   PO2ART 73.0* 10/04/2010 0853   HCO3 24.7* 10/04/2010 0910   TCO2 26 10/04/2010 0910   ACIDBASEDEF 1.0 10/04/2010 0910   O2SAT 69.0 10/04/2010 0910     Assessment and Plan: This is an 78 y/o male with GOLD stage IV COPD who has an acute exacerbation of COPD.  He was noted to have a pulmonary embolism and a small aortic dissection on CT angiogram today which apparently are unchanged from a May 2013 study.  We are consulted for assistance with his shortness of breath.   Acute exacerbation of chronic obstructive pulmonary disease (COPD) (11/27/2011) Assessment: very severe COPD Plan:  -solumedrol  -duoneb q6h, and Q2prn -doxycycline ordered -CT reviewed.  Hypoxemia (11/27/2011) Assessment: due to COPD Plan:  -titrate to O2 sat > 88%  History of pulmonary embolus (07/17/2009) Assessment: He has had several pulmonary emboli over the years, hence the chronic wafarin.  But based on radiology's review of recent CT scans tonight's PE does not appear acute.  Plan:  -If the dissection proves to be a surgical or more acute issue, then he will need an IVC filter -in the meantime, I agree with heparin for PE as per cardiology  Dissection of ascending aorta (11/26/2011) Assessment: Stanford A Plan:  -per cardiology and CV surgery  Canary Brim, NP-C [facility] Pgr: [phone] or [phone]   I have seen and examined this pt and agree this f/u note per NP  Shan Levans Beeper  [phone]  Cell  [phone]  If no response or cell goes to voicemail, call beeper [phone]

## 2011-11-27 NOTE — Progress Notes (Signed)
INITIAL ADULT NUTRITION ASSESSMENT Date: 11/27/2011   Time: 2:23 PM Reason for Assessment: nutrition risk; wt loss  ASSESSMENT: Male 78 y.o.  Dx: dyspnea  Hx:  Past Medical History  Diagnosis Date  . COPD (chronic obstructive pulmonary disease)     Chronic DOE  . Pulmonary embolism     Chronic Coumadin Therapy  . Nonischemic cardiomyopathy     Mild-EF 45-50%  . CHB (complete heart block)     S/P PTVP of CRT device and ICD upgrade in 2008  . Tobacco abuse   . Hypercholesterolemia   . Peripheral neuropathy   . Hypertension   . Arthritis   . Allergic rhinitis   . Prostate cancer    Past Surgical History  Procedure Date  . Prostatectomy   . Cardiac defibrillator placement 2008    St. Jude Upgraded (2008)    Related Meds:  Scheduled Meds:   . albuterol  2.5 mg Nebulization Q6H  . albuterol      . carvedilol  12.5 mg Oral BID WC  . doxycycline  100 mg Oral Q12H  . fluticasone  2 spray Each Nare Daily  . furosemide  20 mg Oral Daily  . ipratropium  0.5 mg Nebulization Q6H  . methylPREDNISolone (SOLU-MEDROL) injection  60 mg Intravenous Q12H  . multivitamin with minerals  1 tablet Oral Daily  . sodium chloride  3 mL Intravenous Q12H  . warfarin  10 mg Oral ONCE-1800  . Warfarin - Pharmacist Dosing Inpatient   Does not apply q1800  . DISCONTD: albuterol  2.5 mg Nebulization Q4H  . DISCONTD: enoxaparin  40 mg Subcutaneous Q24H  . DISCONTD: flunisolide  2 spray Nasal Daily  . DISCONTD: tiotropium  18 mcg Inhalation Daily   Continuous Infusions:   . heparin 1,000 Units/hr (11/27/11 0205)   PRN Meds:.albuterol, ipratropium, DISCONTD: albuterol   Ht: 5\' 11"  (180.3 cm)  Wt: 156 lb 12 oz (71.1 kg)  Ideal Wt: 78.1 kg % Ideal Wt: 90.9%  Usual Wt: 185 lbs per pt % Usual Wt: 83%  Body mass index is 21.86 kg/(m^2).  Food/Nutrition Related Hx: pt reports 30 lbs wt loss in 6 months due to decreased appetite  Labs:  CMP     Component Value Date/Time   NA 142  11/27/2011 0615   K 4.1 11/27/2011 0615   CL 105 11/27/2011 0615   CO2 28 11/27/2011 0615   GLUCOSE 113* 11/27/2011 0615   BUN 18 11/27/2011 0615   CREATININE 1.09 11/27/2011 0615   CALCIUM 9.3 11/27/2011 0615   PROT 6.1 11/27/2011 0615   ALBUMIN 3.2* 11/27/2011 0615   AST 16 11/27/2011 0615   ALT 7 11/27/2011 0615   ALKPHOS 68 11/27/2011 0615   BILITOT 0.4 11/27/2011 0615   GFRNONAA 62* 11/27/2011 0615   GFRAA 71* 11/27/2011 0615    CBC    Component Value Date/Time   LYMPHSABS 1.3 11/27/2011 0053   MONOABS 0.6 11/27/2011 0053   EOSABS 0.4 11/27/2011 0053   BASOSABS 0.0 11/27/2011 0053    Intake: 100% observed at lunch Output:   Intake/Output Summary (Last 24 hours) at 11/27/11 1425 Last data filed at 11/27/11 1200  Gross per 24 hour  Intake    190 ml  Output    875 ml  Net   -685 ml   No BM since admission  Diet Order: Cardiac  Supplements/Tube Feeding:  None at this time  IVF:    heparin Last Rate: 1,000 Units/hr (11/27/11 0205)  Estimated Nutritional Needs:   Kcal: 1980-2130 kcal Protein: 71-85g Fluid: ~2.0 L/day  Pt reports that approximately 6 months ago 'something got my appetite.'  Pt reports that he has continued to eat per his usual, but has progressively lost 30 lbs over the past 6 months.  Pt eating lunch at time of visit, 100% consumed.  Pt reports food provided on current diet order is bland, and that his used to more flavor. Also states his sense of taste has changed.  Again, reports eating well despite this fact.   Pt has been through several recent illnesses that may explain wt loss as well as increased demand/expenditure due to lung disease.  Wt loss is concerning.  Pt with visible fat mass loss at clavicles, decreased muscle mass in thighs, and reports increased fatigue.  Pt qualifies for moderate malnutrition of chronic illness based on wt loss of >10% usual wt and mild body fat and muscle mass depletion.    Pt does not follow a heart healthy diet at home.  Wife with diabetes  prepares meals for she and pt.  NUTRITION DIAGNOSIS: Unintended wt loss  RELATED TO: mixed etiology; decreased appetite, changes in taste, shortness of breath  AS EVIDENCE BY: 16.2% wt loss in 6 months  MONITORING/EVALUATION(Goals): 1.  Food/Beverage; pt to continue to consume >75% of meals.  Will continue to assess for need of supplements 2.  Wt/wt change; deter further wt loss, promote wt gain  EDUCATION NEEDS: -Education needs addressed with pt and family  INTERVENTION: 1.  General healthful diet; discussed wt loss with pt and family.  Expressed concern for degree of weight loss especially with unknown exact cause.  Pt reports he has been eating well, no changes in his intake, but continues to lose wt.  Discussed increased needs due to recent illness and shortness of breath- wife agrees.  Encouraged high protein, high calorie diet. 2.  Modify diet; recommend diet liberalization to Regular.   Dietitian #: [phone]  DOCUMENTATION CODES Per approved criteria  -Non-severe (moderate) malnutrition in the context of chronic illness    Hoyt Koch 11/27/2011, 2:23 PM

## 2011-11-27 NOTE — Consult Note (Signed)
Name: [name] [name] MRN: [id number] DOB: 1928/02/02  LOS: 1  CRITICAL CARE CONSULT NOTE  History of Present Illness: This is a 78 y/o male with GOLD stage IV COPD on home oxygen at night, pulmonary embolism, and pulmonary hypertension was admitted on 11/26/2011 to [facility] with shortness of breath.  He states that he has no sputum production, fever, or chest pain but has noted increasing wheezing and dyspnea for 2-3 days.  At an OSH he was found to have a PE and small aortic dissection so he was transferred here for further management.  No leg swelling noted.  Lines / Drains:   Cultures / Sepsis markers:   Antibiotics: 11/27/2011 doxycycline (AE COPD) >>  Tests / Events: 11/27/2011 CT chest (I am unable to review images)small left lower lobe sub-segmental pulmonary embolism, Stanford A aortic dissection with ulceration, severe emphysema    Past Medical History  Diagnosis Date  . COPD (chronic obstructive pulmonary disease)     Chronic DOE  . Pulmonary embolism     Chronic Coumadin Therapy  . Nonischemic cardiomyopathy     Mild-EF 45-50%  . CHB (complete heart block)     S/P PTVP of CRT device and ICD upgrade in 2008  . Tobacco abuse   . Hypercholesterolemia   . Peripheral neuropathy   . Hypertension   . Arthritis   . Allergic rhinitis   . Prostate cancer    Past Surgical History  Procedure Date  . Prostatectomy   . Cardiac defibrillator placement 2008    St. Jude Upgraded (2008)   Prior to Admission medications   Medication Sig Start Date End Date Taking? Authorizing Provider  albuterol (PROAIR HFA) 108 (90 BASE) MCG/ACT inhaler Inhale 2 puffs into the lungs every 6 (six) hours as needed for wheezing or shortness of breath. 11/10/11 11/09/12 Yes Coralyn Helling, MD  albuterol (PROVENTIL) (2.5 MG/3ML) 0.083% nebulizer solution Take 3 mLs (2.5 mg total) by nebulization every 6 (six) hours as needed for wheezing or shortness of breath. 05/14/11  Yes Coralyn Helling, MD  carvedilol (COREG) 25  MG tablet Take 1 tablet by mouth twice per day.    Yes Historical Provider, MD  flunisolide (NASALIDE) 0.025 % SOLN Place 2 sprays into the nose daily.    Yes Historical Provider, MD  furosemide (LASIX) 40 MG tablet Take 20 mg by mouth daily as needed. For excess fluid retention   Yes Historical Provider, MD  Multiple Vitamin (MULTIVITAMIN WITH MINERALS) TABS Take 1 tablet by mouth daily.   Yes Historical Provider, MD  tiotropium (SPIRIVA) 18 MCG inhalation capsule Place 1 capsule (18 mcg total) into inhaler and inhale daily. 11/10/11 11/09/12 Yes Coralyn Helling, MD  warfarin (COUMADIN) 5 MG tablet Take 7.5-10 mg by mouth daily. Take 7.5 mg daily except for Tuesday take 10 mg   Yes Historical Provider, MD   Allergies  Allergen Reactions  . Lisinopril Swelling    Angioedema  . Aspirin Nausea And Vomiting   Family History  Problem Relation Age of Onset  . Hypertension Mother   . Heart failure Father   . Stroke    . Asthma     Social History  reports that he quit smoking about 18 years ago. His smoking use included Cigarettes. He has a 26 pack-year smoking history. He quit smokeless tobacco use about 21 years ago. His smokeless tobacco use included Chew. He reports that he does not drink alcohol or use illicit drugs.  Review Of Systems   Gen:  Denies fever, chills, weight change, fatigue, night sweats HEENT: Denies blurred vision, double vision, hearing loss, tinnitus, sinus congestion, rhinorrhea, sore throat, neck stiffness, dysphagia PULM: per hpi CV: Denies chest pain, edema, orthopnea, paroxysmal nocturnal dyspnea, palpitations GI: Denies abdominal pain, nausea, vomiting, diarrhea, hematochezia, melena, constipation, change in bowel habits GU: Denies dysuria, hematuria, polyuria, oliguria, urethral discharge Endocrine: Denies hot or cold intolerance, polyuria, polyphagia or appetite change Derm: Denies rash, dry skin, scaling or peeling skin change Heme: Denies easy bruising, bleeding,  bleeding gums Neuro: Denies headache, numbness, weakness, slurred speech, loss of memory or consciousness  Vital Signs:   Filed Vitals:   11/26/11 2300 11/26/11 2338 11/27/11 0000 11/27/11 0155  BP: 129/61  146/65   Pulse: 70  69   Temp:  98.3 F (36.8 C)    TempSrc:  Oral    Resp: 21  25   Height:      Weight:      SpO2: 99%  99% 98%  O2: 2 L Rankin  Physical Examination: Gen: chronically ill appearing, mild resp distress, speaking in full sentences HEENT: NCAT, PERRL, EOMi, OP clear,  Neck: supple without masses PULM: poor air movement, marked wheezing bilaterally CV: distant, very difficult to hear AB: BS+, soft, nontender, no hsm Ext: warm, no edema, no clubbing, no cyanosis Derm: no rash or skin breakdown Neuro: A&Ox4, CN II-XII intact, strength 5/5 in all 4 extremities Psyche: Normal mood and affect  Labs and Imaging:   CBC    Component Value Date/Time   LYMPHSABS 1.3 11/27/2011 0053   MONOABS 0.6 11/27/2011 0053   EOSABS 0.4 11/27/2011 0053   BASOSABS 0.0 11/27/2011 0053    BMET No results found for this basename: na, k, cl, co2, glucose, bun, creatinine, calcium, gfrnonaa, gfraa    ABG    Component Value Date/Time   PHART 7.385 10/04/2010 0853   PCO2ART 39.4 10/04/2010 0853   PO2ART 73.0* 10/04/2010 0853   HCO3 24.7* 10/04/2010 0910   TCO2 26 10/04/2010 0910   ACIDBASEDEF 1.0 10/04/2010 0910   O2SAT 69.0 10/04/2010 0910     Assessment and Plan: This is an 78 y/o male with GOLD stage IV COPD who has an acute exacerbation of COPD.  He was noted to have a pulmonary embolism and a small aortic dissection on CT angiogram today which apparently are unchanged from a May 2013 study.  We are consulted for assistance with his shortness of breath.   Acute exacerbation of chronic obstructive pulmonary disease (COPD) (11/27/2011)   Assessment: very severe COPD   Plan:  -solumedrol ordered -duoneb q6h, and prn -doxycycline ordered -need to review CT images when  available  Hypoxemia (11/27/2011)   Assessment: due to COPD   Plan:  -titrate to O2 sat > 88%  History of pulmonary embolus (07/17/2009)   Assessment: He has had several pulmonary emboli over the years, hence the chronic wafarin.  But based on radiology's review of recent CT scans tonight's PE does not appear acute.    Plan:  -If the dissection proves to be a surgical or more acute issue, then he will need an IVC filter -in the meantime, I agree with heparin for PE as per cardiology  Dissection of ascending aorta (11/26/2011)   Assessment: Stanford A   Plan:  -per cardiology and CV surgery   Heber McFarland, M.D. Pulmonary and Critical Care Medicine [facility] Pager: [phone]  11/27/2011, 2:08 AM

## 2011-11-27 NOTE — Progress Notes (Signed)
ANTICOAGULATION CONSULT NOTE - Follow Up Consult  Pharmacy Consult for Heparin Indication: New PE, hx PE with subtherapeutic INR   Allergies  Allergen Reactions  . Lisinopril Swelling    Angioedema  . Aspirin Nausea And Vomiting    Patient Measurements: Height: 5\' 11"  (180.3 cm) Weight: 156 lb 12 oz (71.1 kg) IBW/kg (Calculated) : 75.3  Heparin Dosing Weight: 71kg  Vital Signs: Temp: 97.4 F (36.3 C) (07/04 1600) Temp src: Oral (07/04 1600) BP: 135/55 mmHg (07/04 1600) Pulse Rate: 85  (07/04 1600)  Labs:  Basename 11/27/11 1729 11/27/11 0953 11/27/11 0615 11/27/11 0053  HGB -- -- -- --  HCT -- -- -- --  PLT -- -- -- --  APTT -- -- -- --  LABPROT -- -- 19.5* 18.4*  INR -- -- 1.62* 1.50*  HEPARINUNFRC 0.70 0.34 -- --  CREATININE -- -- 1.09 --  CKTOTAL -- -- -- --  CKMB -- -- -- --  TROPONINI -- -- -- --    Estimated Creatinine Clearance: 53.5 ml/min (by C-G formula based on Cr of 1.09).   Medications:  Heparin 1000 units/hr  Assessment: 83yom on heparin for new PE (per CCM may not be acute), hx PE, stable aortic dissection and subtherapeutic INR. MD has requested conservative dosing due to aortic dissection (goal heparin level 0.3-0.5). Heparin level (0.7) is supratherapeutic. CBC has been ordered for AM labs. Per RN, patient has no significant bleeding.   Goal of Therapy:  Heparin level 0.3-0.5 units/ml Monitor platelets by anticoagulation protocol: Yes   Plan:  1. Decrease heparin drip to 850 units/hr (8.5 ml/hr) 2. Check heparin level 8 hours after rate decrease 3. Check CBC with next heparin level  Cleon Dew [phone] 11/27/2011,7:01 PM

## 2011-11-28 ENCOUNTER — Encounter (HOSPITAL_COMMUNITY)
Admission: AD | Disposition: A | Discharge status: Home / Self Care | Payer: Self-pay | Source: Other Acute Inpatient Hospital | Attending: Cardiology

## 2011-11-28 ENCOUNTER — Inpatient Hospital Stay (HOSPITAL_COMMUNITY): Payer: Medicare Other

## 2011-11-28 ENCOUNTER — Encounter (HOSPITAL_COMMUNITY): Payer: Self-pay | Admitting: Internal Medicine

## 2011-11-28 DIAGNOSIS — R0989 Other specified symptoms and signs involving the circulatory and respiratory systems: Secondary | ICD-10-CM

## 2011-11-28 DIAGNOSIS — R0609 Other forms of dyspnea: Secondary | ICD-10-CM

## 2011-11-28 HISTORY — PX: RIGHT HEART CATHETERIZATION: SHX5447

## 2011-11-28 LAB — CBC
HCT: 36 % — ABNORMAL LOW (ref 39.0–52.0)
Hemoglobin: 11.4 g/dL — ABNORMAL LOW (ref 13.0–17.0)
MCH: 25.9 pg — ABNORMAL LOW (ref 26.0–34.0)
MCV: 81.6 fL (ref 78.0–100.0)
Platelets: 160 10*3/uL (ref 150–400)
RBC: 4.41 MIL/uL (ref 4.22–5.81)
WBC: 7 10*3/uL (ref 4.0–10.5)

## 2011-11-28 LAB — BASIC METABOLIC PANEL
BUN: 17 mg/dL (ref 6–23)
CO2: 29 mEq/L (ref 19–32)
GFR calc non Af Amer: 66 mL/min — ABNORMAL LOW (ref >90)
Glucose, Bld: 106 mg/dL — ABNORMAL HIGH (ref 70–99)
Potassium: 4.2 mEq/L (ref 3.5–5.1)
Sodium: 141 mEq/L (ref 135–145)

## 2011-11-28 LAB — POCT I-STAT 3, VENOUS BLOOD GAS (G3P V)
Acid-Base Excess: 4 mmol/L — ABNORMAL HIGH (ref 0.0–2.0)
O2 Saturation: 73 %
O2 Saturation: 75 %
TCO2: 33 mmol/L (ref 0–100)
pCO2, Ven: 51.2 mmHg — ABNORMAL HIGH (ref 45.0–50.0)
pH, Ven: 7.394 — ABNORMAL HIGH (ref 7.250–7.300)
pO2, Ven: 39 mmHg (ref 30.0–45.0)

## 2011-11-28 LAB — POCT I-STAT 3, ART BLOOD GAS (G3+)
O2 Saturation: 100 %
pCO2 arterial: 49 mmHg — ABNORMAL HIGH (ref 35.0–45.0)
pH, Arterial: 7.315 — ABNORMAL LOW (ref 7.350–7.450)
pO2, Arterial: 214 mmHg — ABNORMAL HIGH (ref 80.0–100.0)

## 2011-11-28 LAB — HEPARIN LEVEL (UNFRACTIONATED): Heparin Unfractionated: 0.69 IU/mL (ref 0.30–0.70)

## 2011-11-28 SURGERY — RIGHT HEART CATH
Anesthesia: LOCAL

## 2011-11-28 MED ORDER — SODIUM CHLORIDE 0.9 % IV SOLN: 250.0000 mL | INTRAVENOUS | Status: DC | PRN

## 2011-11-28 MED ORDER — SODIUM CHLORIDE 0.9 % IJ SOLN: 3.0000 mL | INTRAMUSCULAR | Status: DC | PRN

## 2011-11-28 MED ORDER — HEPARIN (PORCINE) IN NACL 100-0.45 UNIT/ML-% IJ SOLN
850.0000 [IU]/h | INTRAMUSCULAR | Status: DC
  Administered 2011-11-28: 800 [IU]/h via INTRAVENOUS
  Administered 2011-11-29 – 2011-12-01 (×3): 850 [IU]/h via INTRAVENOUS
  Filled 2011-11-28 (×4): qty 250

## 2011-11-28 MED ORDER — SODIUM CHLORIDE 0.9 % IJ SOLN
3.0000 mL | Freq: Two times a day (BID) | INTRAMUSCULAR | Status: DC
  Administered 2011-11-28 – 2011-11-30 (×3): 3 mL via INTRAVENOUS

## 2011-11-28 MED ORDER — IPRATROPIUM BROMIDE 0.02 % IN SOLN
0.5000 mg | RESPIRATORY_TRACT | Status: DC
  Administered 2011-11-28 – 2011-12-01 (×16): 0.5 mg via RESPIRATORY_TRACT
  Filled 2011-11-28 (×23): qty 2.5

## 2011-11-28 MED ORDER — HEPARIN (PORCINE) IN NACL 2-0.9 UNIT/ML-% IJ SOLN
INTRAMUSCULAR | Status: AC
  Filled 2011-11-28: qty 1000

## 2011-11-28 MED ORDER — SODIUM CHLORIDE 0.9 % IJ SOLN: 3.0000 mL | Freq: Two times a day (BID) | INTRAMUSCULAR | Status: DC

## 2011-11-28 MED ORDER — WARFARIN SODIUM 10 MG PO TABS
10.0000 mg | ORAL_TABLET | Freq: Once | ORAL | Status: AC
  Administered 2011-11-28: 10 mg via ORAL
  Filled 2011-11-28: qty 1

## 2011-11-28 MED ORDER — ENSURE COMPLETE PO LIQD
237.0000 mL | Freq: Two times a day (BID) | ORAL | Status: DC
  Administered 2011-11-28 – 2011-12-01 (×6): 237 mL via ORAL

## 2011-11-28 MED ORDER — ALBUTEROL SULFATE (5 MG/ML) 0.5% IN NEBU
2.5000 mg | INHALATION_SOLUTION | RESPIRATORY_TRACT | Status: DC
  Administered 2011-11-28 – 2011-12-01 (×17): 2.5 mg via RESPIRATORY_TRACT
  Filled 2011-11-28 (×23): qty 0.5

## 2011-11-28 MED ORDER — LIDOCAINE HCL (PF) 1 % IJ SOLN
INTRAMUSCULAR | Status: AC
  Filled 2011-11-28: qty 30

## 2011-11-28 MED ORDER — MAGNESIUM HYDROXIDE 400 MG/5ML PO SUSP
30.0000 mL | Freq: Every day | ORAL | Status: DC | PRN
  Administered 2011-11-29: 30 mL via ORAL
  Filled 2011-11-28: qty 30

## 2011-11-28 MED ORDER — HEPARIN (PORCINE) IN NACL 100-0.45 UNIT/ML-% IJ SOLN
750.0000 [IU]/h | INTRAMUSCULAR | Status: DC
  Filled 2011-11-28: qty 250

## 2011-11-28 MED ORDER — PREDNISONE 20 MG PO TABS
40.0000 mg | ORAL_TABLET | Freq: Every day | ORAL | Status: DC
  Administered 2011-11-28 – 2011-12-01 (×4): 40 mg via ORAL
  Filled 2011-11-28 (×5): qty 2

## 2011-11-28 MED ORDER — ACETAMINOPHEN 325 MG PO TABS: 650.0000 mg | ORAL_TABLET | ORAL | Status: DC | PRN

## 2011-11-28 MED ORDER — ONDANSETRON HCL 4 MG/2ML IJ SOLN: 4.0000 mg | Freq: Four times a day (QID) | INTRAMUSCULAR | Status: DC | PRN

## 2011-11-28 NOTE — Care Management Note (Signed)
    Page 1 of 1   11/28/2011     10:12:17 AM   CARE MANAGEMENT NOTE 11/28/2011  Patient:  [name] [name], [name] [name]   Account Number:  [id number]  Date Initiated:  11/28/2011  Documentation initiated by:  Junius Creamer  Subjective/Objective Assessment:   adm w copd exacerbation     Action/Plan:   lives w wife, pcp dr Beatrix Fetters shah   Anticipated DC Date:     Anticipated DC Plan:        DC Planning Services  CM consult      Choice offered to / List presented to:             Status of service:   Medicare Important Message given?   (If response is "NO", the following Medicare IM given date fields will be blank) Date Medicare IM given:   Date Additional Medicare IM given:    Discharge Disposition:    Per UR Regulation:  Reviewed for med. necessity/level of care/duration of stay  If discussed at Long Length of Stay Meetings, dates discussed:    Comments:  7/5 10am debbie Dlynn Ranes rn,bsn [phone] pt has home o2 and neb, he would like neb that has battery or can be used on car charger-[facility] states he got neb in 12-12 and medicare will not let them change it out yet. [facility] states some drugstores carry port nebs for 45-50.00 and i alerted pt to this.

## 2011-11-28 NOTE — Interval H&P Note (Signed)
History and Physical Interval Note:  11/28/2011 4:53 PM  [name] [name]  has presented today for surgery, with the diagnosis of dyspnea and pulmonary HTN on echo. The various methods of treatment have been discussed with the patient and family. After consideration of risks, benefits and other options for treatment, the patient has consented to  Procedure(s) (LRB): RIGHT HEART CATH (N/A) as a surgical intervention .  The patient's history has been reviewed, patient examined, no change in status, stable for surgery.  I have reviewed the patients' chart and labs.  Questions were answered to the patient's satisfaction.     Daniel Bensimhon

## 2011-11-28 NOTE — Progress Notes (Signed)
ANTICOAGULATION CONSULT NOTE - Follow Up Consult  Pharmacy Consult for Heparin + Warfarin Indication: Pulmonary Embolus  Allergies  Allergen Reactions  . Lisinopril Swelling    Angioedema  . Aspirin Nausea And Vomiting    Patient Measurements: Height: 5\' 11"  (180.3 cm) Weight: 157 lb 3 oz (71.3 kg) IBW/kg (Calculated) : 75.3  Heparin Dosing Weight: 71.3 kg  Vital Signs: Temp: 98.2 F (36.8 C) (07/05 1607) Temp src: Oral (07/05 1607) BP: 136/53 mmHg (07/05 1900) Pulse Rate: 74  (07/05 1900)  Labs:  Basename 11/28/11 1117 11/28/11 0330 11/27/11 1729 11/27/11 0615 11/27/11 0053  HGB -- 11.4* -- -- --  HCT -- 36.0* -- -- --  PLT -- 160 -- -- --  APTT -- -- -- -- --  LABPROT -- 18.6* -- 19.5* 18.4*  INR -- 1.52* -- 1.62* 1.50*  HEPARINUNFRC 0.69 0.48 0.70 -- --  CREATININE -- 1.03 -- 1.09 --  CKTOTAL -- -- -- -- --  CKMB -- -- -- -- --  TROPONINI -- -- -- -- --    Estimated Creatinine Clearance: 56.7 ml/min (by C-G formula based on Cr of 1.03).   Assessment: 78 y.o. M to resume heparin 4 hours post sheath removal for continued bridging to therapeutic INR with warfarin for a new pulmonary embolus (pt also has hx of prior PE). The patient's sheath was removed at 1730 today. No s/sx of bleeding are noted at this time  Goal of Therapy:  INR 2-3 Heparin level 0.3-0.7 units/ml Monitor platelets by anticoagulation protocol: Yes   Plan:  1. Resume heparin at a drip rate of 800 units/hr (8 ml/hr) starting at 2130 this evening 2. Warfarin 10 mg x 1 dose this evening (as previously ordered) 3. Will continue to monitor for any signs/symptoms of bleeding and will follow up with heparin level in 8 hours   Georgina Pillion, PharmD, BCPS Clinical Pharmacist Pager: [phone] 11/28/2011 9:00 PM

## 2011-11-28 NOTE — CV Procedure (Signed)
Cardiac Cath Procedure Note:  Indication:  Dyspnea  Procedures performed:  1) Right heart catheterization 2) L femoral venogram  Description of procedure:   The risks and indication of the procedure were explained. Consent was signed and placed on the chart. An appropriate timeout was taken prior to the procedure. The right groin was prepped and draped in the routine sterile fashion and anesthetized with 1% local lidocaine.   A 7 FR venous sheath was placed in the right femoral vein using a modified Seldinger technique. We were unable to pass the Theone Murdoch through the iliac vein despite multiple attempts. We then placed another 7FR sheath slightly above the original stick but we were still unable to feed the Swan even using an 0.25 wire. We then did a R femoral/iliac venogram which showed a patent iliac vein. After multiple attempts, we were eventually able to feed the long 0.25 wire through the iliac vein and into the IVC. A standard RHC was then performed.    Complications: None apparent.  Findings:  RA =  6 RV = 36/2/8 PA =  42/8 (23) PCW = 9  Fick cardiac output/index = 6.2/3.3 PVR = 2.3 Woods FA sat = 100%  PA sat = 73%, 75%  Assessment:  1) Minimally elevated PA pressures. No change from previous 2) Normal wedge and cardiac output  Plan/Discussion:  RHC essentially normal. Suspect dyspnea due to COPD. No further cardiac w/u indicated at this point.  Arvilla Meres, MD 5:35 PM

## 2011-11-28 NOTE — H&P (View-Only) (Signed)
Patient Name: Dragon H Tracz      SUBJECTIVE: admitted with shortness of breath  CT [facility]>>small pe and aortic dissection,.  Data reviewed last night by DR WST with radiology and both findings old as of May 2013.  Pt seen by CCM>>very severe COPD and Rx starrted  CV surgery consult >>conserative management-- Dr PVT not even sure not artifact  SOB has been very severe since march when he said he had pneumonia   Past Medical History  Diagnosis Date  . COPD (chronic obstructive pulmonary disease)     Chronic DOE  . Pulmonary embolism     Chronic Coumadin Therapy  . Nonischemic cardiomyopathy     Mild-EF 45-50%  . CHB (complete heart block)     S/P PTVP of CRT device and ICD upgrade in 2008  . Tobacco abuse   . Hypercholesterolemia   . Peripheral neuropathy   . Hypertension   . Arthritis   . Allergic rhinitis   . Prostate cancer     PHYSICAL EXAM Filed Vitals:   11/28/11 0300 11/28/11 0400 11/28/11 0450 11/28/11 0600  BP: 145/68 140/69  139/65  Pulse: 85 85  85  Temp:      TempSrc:      Resp: 36 20  17  Height:      Weight:   157 lb 3 oz (71.3 kg)   SpO2: 100% 100%  99%    Well developed and nourished in no acute distress HENT normal Neck supple   Clear-surprisningly Regular rate and rhythm, 2/6 sys m Abd-soft with active BS No Clubbing cyanosis edema Skin-warm and dry A & Oriented  Grossly normal sensory and motor function  TELEMETRY: Reviewed telemetry pt in av pacing:    Intake/Output Summary (Last 24 hours) at 11/28/11 0746 Last data filed at 11/28/11 0700  Gross per 24 hour  Intake   1162 ml  Output   2450 ml  Net  -1288 ml    LABS: Basic Metabolic Panel:  Lab 11/28/11 0330 11/27/11 0615  NA 141 142  K 4.2 4.1  CL 105 105  CO2 29 28  GLUCOSE 106* 113*  BUN 17 18  CREATININE 1.03 1.09  CALCIUM 9.2 9.3  MG -- --  PHOS -- --   Cardiac Enzymes: No results found for this basename: CKTOTAL:3,CKMB:3,CKMBINDEX:3,TROPONINI:3 in the last 72  hours CBC:  Lab 11/28/11 0330 11/27/11 0053  WBC 7.0 --  NEUTROABS -- 2.7  HGB 11.4* --  HCT 36.0* --  MCV 81.6 --  PLT 160 --   PROTIME:  Basename 11/28/11 0330 11/27/11 0615 11/27/11 0053  LABPROT 18.6* 19.5* 18.4*  INR 1.52* 1.62* 1.50*   Liver Function Tests:  Basename 11/27/11 0615  AST 16  ALT 7  ALKPHOS 68  BILITOT 0.4  PROT 6.1  ALBUMIN 3.2*   No results found for this basename: LIPASE:2,AMYLASE:2 in the last 72 hours BNP: No components found with this basename: POCBNP:3 D-Dimer:  Basename 11/27/11 0053  DDIMER 2.00*   Hemoglobin A1C: No results found for this basename: HGBA1C in the last 72 hours Fasting Lipid Panel: No results found for this basename: CHOL,HDL,LDLCALC,TRIG,CHOLHDL,LDLDIRECT in the last 72 hours Thyroid Function Tests: No results found for this basename: TSH,T4TOTAL,FREET3,T3FREE,THYROIDAB in the last 72 hours Anemia Panel: No results found for this basename: VITAMINB12,FOLATE,FERRITIN,TIBC,IRON,RETICCTPCT in the last 72 hours   Device Interrogation:pending   ASSESSMENT AND PLAN:  Patient Active Hospital Problem List: DYSPNEA (02/21/2009)   History of pulmonary embolus (07/17/2009)     Dissection of ascending aorta (11/26/2011)   Acute exacerbation of chronic obstructive pulmonary disease (COPD) (11/27/2011)   Hypoxemia (11/27/2011)   There has been discussion about evaluation for CAD given echo 45-50<<50-55% w WMA,  But pt had neg cath in 5/12 and normalization of previous NICM. I am not inclined to pursue that diagnosis.    Will doRHC todayy  Pt notes weight loss>> 14  Lbs over last year  Spoke with Dr Wright >>klook forward to insights.  He say worse since pneumonia in Mar.. Do we understand pulmnoary process and prognosis??     Signed, Terryl Niziolek MD  11/28/2011   

## 2011-11-28 NOTE — Progress Notes (Signed)
ANTICOAGULATION CONSULT NOTE - Follow Up Consult  Pharmacy Consult for heparin Indication: pulmonary embolus  Labs:  Basename 11/28/11 0330 11/27/11 1729 11/27/11 0953 11/27/11 0615 11/27/11 0053  HGB 11.4* -- -- -- --  HCT 36.0* -- -- -- --  PLT 160 -- -- -- --  APTT -- -- -- -- --  LABPROT 18.6* -- -- 19.5* 18.4*  INR 1.52* -- -- 1.62* 1.50*  HEPARINUNFRC 0.48 0.70 0.34 -- --  CREATININE -- -- -- 1.09 --  CKTOTAL -- -- -- -- --  CKMB -- -- -- -- --  TROPONINI -- -- -- -- --    Assessment/Goal: 78yo male now therapeutic on heparin after rate decrease with low goal due for PE to aortic dissection.  Will continue gtt at current rate and confirm stable with additional level.  Colleen Can PharmD BCPS 11/28/2011,4:33 AM

## 2011-11-28 NOTE — Progress Notes (Signed)
ANTICOAGULATION CONSULT NOTE - Follow Up Consult  Pharmacy Consult for Heparin/Coumadin Indication: pulmonary embolus  Allergies  Allergen Reactions  . Lisinopril Swelling    Angioedema  . Aspirin Nausea And Vomiting    Patient Measurements: Height: 5\' 11"  (180.3 cm) Weight: 157 lb 3 oz (71.3 kg) IBW/kg (Calculated) : 75.3  Heparin Dosing Weight: 71.3 kg  Vital Signs: Temp: 98.1 F (36.7 C) (07/05 1200) Temp src: Oral (07/05 1200) BP: 136/64 mmHg (07/05 1200) Pulse Rate: 85  (07/05 1200)  Labs:  Basename 11/28/11 1117 11/28/11 0330 11/27/11 1729 11/27/11 0615 11/27/11 0053  HGB -- 11.4* -- -- --  HCT -- 36.0* -- -- --  PLT -- 160 -- -- --  APTT -- -- -- -- --  LABPROT -- 18.6* -- 19.5* 18.4*  INR -- 1.52* -- 1.62* 1.50*  HEPARINUNFRC 0.69 0.48 0.70 -- --  CREATININE -- 1.03 -- 1.09 --  CKTOTAL -- -- -- -- --  CKMB -- -- -- -- --  TROPONINI -- -- -- -- --    Estimated Creatinine Clearance: 56.7 ml/min (by C-G formula based on Cr of 1.03).   Medications:  Infusions:    . heparin 850 Units/hr (11/28/11 0448)    Assessment: 78 yo male on IV heparin at 850 units/hr.  Heparin level is therapeutic, but is trending up on current rate.  Also on chronic Coumadin for hx PE, Coumadin 10 mg dose ordered last night, but it was never charted, unsure if given.  INR remains subtherapeutic today at 1.52.  No bleeding or complications noted.  Goal of Therapy:  INR 2-3 Heparin level 0.3-0.7 Monitor platelets by anticoagulation protocol: Yes   Plan:  1. Decrease heparin to 750 units/hr to keep in therapeutic range. 2. Check AM heparin level and CBC. 3. Coumadin 10 mg po x 1 tonight. 4. Daily PT/INR.  Taysom Glymph C 11/28/2011,1:34 PM

## 2011-11-28 NOTE — Progress Notes (Addendum)
Name: [name] [name] MRN: [id number] DOB: May 14, 1928  LOS: 2  CRITICAL CARE CONSULT NOTE  History of Present Illness: This is a 78 y/o male with GOLD stage IV COPD on home oxygen at night, pulmonary embolism, and pulmonary hypertension was admitted on 11/26/2011 to [facility] with shortness of breath.    Antibiotics: 11/27/2011 doxycycline (AE COPD) >>  Tests / Events: 11/27/2011 CT chest (I am unable to review images) small left lower lobe sub-segmental pulmonary embolism, Stanford A aortic dissection with ulceration, severe emphysema     Vital Signs:   Filed Vitals:   11/28/11 0450 11/28/11 0600 11/28/11 0800 11/28/11 0840  BP:  139/65 174/78   Pulse:  85 85   Temp:   98.5 F (36.9 C)   TempSrc:   Oral   Resp:  17 21   Height:      Weight: 71.3 kg (157 lb 3 oz)     SpO2:  99% 100% 100%  O2: 2 L [place]  Physical Examination: Gen: chronically ill appearing, mild resp distress, speaking in full sentences HEENT: NCAT, PERRL, EOMi, OP clear,  Neck: supple without masses PULM: poor air movement, distant BS CV: distant tones AB: BS+, soft, nontender, no hsm Ext: warm, no edema, no clubbing, no cyanosis Derm: no rash or skin breakdown Neuro: A&Ox4, CN II-XII intact, strength 5/5 in all 4 extremities Psyche: Normal mood and affect  Labs and Imaging:   CBC    Component Value Date/Time   WBC 7.0 11/28/2011 0330   RBC 4.41 11/28/2011 0330   HGB 11.4* 11/28/2011 0330   HCT 36.0* 11/28/2011 0330   PLT 160 11/28/2011 0330   MCV 81.6 11/28/2011 0330   MCH 25.9* 11/28/2011 0330   MCHC 31.7 11/28/2011 0330   RDW 15.7* 11/28/2011 0330   LYMPHSABS 1.3 11/27/2011 0053   MONOABS 0.6 11/27/2011 0053   EOSABS 0.4 11/27/2011 0053   BASOSABS 0.0 11/27/2011 0053    BMET    Component Value Date/Time   NA 141 11/28/2011 0330    ABG    Component Value Date/Time   PHART 7.385 10/04/2010 0853   PCO2ART 39.4 10/04/2010 0853   PO2ART 73.0* 10/04/2010 0853   HCO3 24.7* 10/04/2010 0910   TCO2 26 10/04/2010 0910   ACIDBASEDEF  1.0 10/04/2010 0910   O2SAT 69.0 10/04/2010 0910     Assessment and Plan: This is an 78 y/o male with GOLD stage IV COPD who has an acute exacerbation of COPD.  The PE is old and unchanged.   FeV1 24% pred 04/2011 spiro.  Acute exacerbation of chronic obstructive pulmonary disease (COPD) (11/27/2011) Assessment: very severe COPD, I would say most if not all of his dyspnea is d/t severe Gold stage IV COPD  FeV1 24% Note this pt is followed in the office by Coralyn Helling  Isuspect the pulmonary HTN is d/t Copd Plan:  -change  Solumedrol to prednisone -duoneb q6h, and Q2prn -doxycycline to cont -cont home oxygen.   Hypoxemia (11/27/2011) Assessment: due to COPD Plan:  -titrate to O2 sat > 88%  History of pulmonary embolus (07/17/2009) Assessment: PE is chronic  Plan:  -maintain coumadin -for RHC today?   Dissection of ascending aorta (11/26/2011) Assessment:  This is not acute. Plan Per cardiology      Shan Levans Beeper  [phone]  Cell  [phone]  If no response or cell goes to voicemail, call beeper [phone]

## 2011-11-28 NOTE — Telephone Encounter (Signed)
[id number] x 2  

## 2011-11-28 NOTE — Progress Notes (Signed)
Patient Name: [name] [name]      SUBJECTIVE: admitted with shortness of breath  CT [facility]>>small pe and aortic dissection,.  Data reviewed last night by DR [facility] with radiology and both findings old as of May 2013.  Pt seen by CCM>>very severe COPD and Rx starrted  CV surgery consult >>conserative management-- Dr PVT not even sure not artifact  SOB has been very severe since march when he said he had pneumonia   Past Medical History  Diagnosis Date  . COPD (chronic obstructive pulmonary disease)     Chronic DOE  . Pulmonary embolism     Chronic Coumadin Therapy  . Nonischemic cardiomyopathy     Mild-EF 45-50%  . CHB (complete heart block)     S/P PTVP of CRT device and ICD upgrade in 2008  . Tobacco abuse   . Hypercholesterolemia   . Peripheral neuropathy   . Hypertension   . Arthritis   . Allergic rhinitis   . Prostate cancer     PHYSICAL EXAM Filed Vitals:   11/28/11 0300 11/28/11 0400 11/28/11 0450 11/28/11 0600  BP: 145/68 140/69  139/65  Pulse: 85 85  85  Temp:      TempSrc:      Resp: 36 20  17  Height:      Weight:   157 lb 3 oz (71.3 kg)   SpO2: 100% 100%  99%    Well developed and nourished in no acute distress HENT normal Neck supple   Clear-surprisningly Regular rate and rhythm, 2/6 sys m Abd-soft with active BS No Clubbing cyanosis edema Skin-warm and dry A & Oriented  Grossly normal sensory and motor function  TELEMETRY: Reviewed telemetry pt in av pacing:    Intake/Output Summary (Last 24 hours) at 11/28/11 0746 Last data filed at 11/28/11 0700  Gross per 24 hour  Intake   1162 ml  Output   2450 ml  Net  -1288 ml    LABS: Basic Metabolic Panel:  Lab 11/28/11 4098 11/27/11 0615  NA 141 142  K 4.2 4.1  CL 105 105  CO2 29 28  GLUCOSE 106* 113*  BUN 17 18  CREATININE 1.03 1.09  CALCIUM 9.2 9.3  MG -- --  PHOS -- --   Cardiac Enzymes: No results found for this basename: CKTOTAL:3,CKMB:3,CKMBINDEX:3,TROPONINI:3 in the last 72  hours CBC:  Lab 11/28/11 0330 11/27/11 0053  WBC 7.0 --  NEUTROABS -- 2.7  HGB 11.4* --  HCT 36.0* --  MCV 81.6 --  PLT 160 --   PROTIME:  Basename 11/28/11 0330 11/27/11 0615 11/27/11 0053  LABPROT 18.6* 19.5* 18.4*  INR 1.52* 1.62* 1.50*   Liver Function Tests:  Basename 11/27/11 0615  AST 16  ALT 7  ALKPHOS 68  BILITOT 0.4  PROT 6.1  ALBUMIN 3.2*   No results found for this basename: LIPASE:2,AMYLASE:2 in the last 72 hours BNP: No components found with this basename: POCBNP:3 D-Dimer:  Alvira Philips 11/27/11 0053  DDIMER 2.00*   Hemoglobin A1C: No results found for this basename: HGBA1C in the last 72 hours Fasting Lipid Panel: No results found for this basename: CHOL,HDL,LDLCALC,TRIG,CHOLHDL,LDLDIRECT in the last 72 hours Thyroid Function Tests: No results found for this basename: TSH,T4TOTAL,FREET3,T3FREE,THYROIDAB in the last 72 hours Anemia Panel: No results found for this basename: VITAMINB12,FOLATE,FERRITIN,TIBC,IRON,RETICCTPCT in the last 72 hours   Device Interrogation:pending   ASSESSMENT AND PLAN:  Patient Active Hospital Problem List: DYSPNEA (02/21/2009)   History of pulmonary embolus (07/17/2009)  Dissection of ascending aorta (11/26/2011)   Acute exacerbation of chronic obstructive pulmonary disease (COPD) (11/27/2011)   Hypoxemia (11/27/2011)   There has been discussion about evaluation for CAD given echo 45-50<<50-55% w WMA,  But pt had neg cath in 5/12 and normalization of previous NICM. I am not inclined to pursue that diagnosis.    [facility]  Pt notes weight loss>> 14  Lbs over last year  Spoke with Dr Delford Field >>klook forward to insights.  He say worse since pneumonia in Mar.. Do we understand pulmnoary process and prognosis??     Signed, Sherryl Manges MD  11/28/2011

## 2011-11-28 NOTE — Progress Notes (Signed)
  Nutrition Follow-up/consult  Intervention:   1. Ensure Complete po BID, each supplement provides 350 kcal and 13 grams of protein. 2. Magic Cup with dinner tray 3. RD will continue to follow    Assessment:   Pt stated that he did not get very much food for breakfast, he ate it all. Continues with poor appetite but eats because he knows he needs the food. Was on Ensure at home x2 daily with out any weight gain so he stopped. Agreeable to starting it again. Pt would also like to try other types of supplements, RD to add Magic Cup to dinner tray.   For a RHC today, states he as not gotten a lunch tray. Pt still has diet orders for heart healthy.   Diet Order:  Heart Healthy PO intake: no intake documented.  NO supplements ordered.   Meds: Scheduled Meds:   . albuterol  2.5 mg Nebulization Q6H  . carvedilol  12.5 mg Oral BID WC  . doxycycline  100 mg Oral Q12H  . fluticasone  2 spray Each Nare Daily  . furosemide  20 mg Oral Daily  . ipratropium  0.5 mg Nebulization Q6H  . multivitamin with minerals  1 tablet Oral Daily  . predniSONE  40 mg Oral Q breakfast  . sodium chloride  3 mL Intravenous Q12H  . sodium chloride  3 mL Intravenous Q12H  . warfarin  10 mg Oral ONCE-1800  . Warfarin - Pharmacist Dosing Inpatient   Does not apply q1800  . DISCONTD: methylPREDNISolone (SOLU-MEDROL) injection  60 mg Intravenous Q12H   Continuous Infusions:   . heparin 750 Units/hr (11/28/11 1400)  . DISCONTD: heparin 850 Units/hr (11/28/11 0448)   PRN Meds:.sodium chloride, albuterol, ipratropium, sodium chloride  Labs:  CMP     Component Value Date/Time   NA 141 11/28/2011 0330   K 4.2 11/28/2011 0330   CL 105 11/28/2011 0330   CO2 29 11/28/2011 0330   GLUCOSE 106* 11/28/2011 0330   BUN 17 11/28/2011 0330   CREATININE 1.03 11/28/2011 0330   CALCIUM 9.2 11/28/2011 0330   PROT 6.1 11/27/2011 0615   ALBUMIN 3.2* 11/27/2011 0615   AST 16 11/27/2011 0615   ALT 7 11/27/2011 0615   ALKPHOS 68 11/27/2011 0615   BILITOT 0.4 11/27/2011 0615   GFRNONAA 66* 11/28/2011 0330   GFRAA 77* 11/28/2011 0330     Intake/Output Summary (Last 24 hours) at 11/28/11 1543 Last data filed at 11/28/11 1500  Gross per 24 hour  Intake 1049.5 ml  Output   1875 ml  Net -825.5 ml    Weight Status:  157 lbs, trending up  Re-estimated needs:  2000-2200 kcal, 70-85 gm protein   Nutrition Dx:  Unintentional weight loss r/t decreased appetite, change in taste and SOB AEB 16.2% weight loss in 6 months   Goal:   1. Food/Beverage; pt to continue to consume >75% of meals.  2. Wt/wt change; deter further wt loss, promote wt gain    Monitor:  PO intake, supplement preference, weight   Clarene Duke RD, LDN Pager [phone] After Hours pager [phone]

## 2011-11-29 LAB — CBC
HCT: 38.6 % — ABNORMAL LOW (ref 39.0–52.0)
Hemoglobin: 12.2 g/dL — ABNORMAL LOW (ref 13.0–17.0)
MCH: 26.2 pg (ref 26.0–34.0)
MCHC: 31.6 g/dL (ref 30.0–36.0)
MCV: 83 fL (ref 78.0–100.0)
Platelets: 161 10*3/uL (ref 150–400)
RBC: 4.65 MIL/uL (ref 4.22–5.81)
RDW: 16.1 % — ABNORMAL HIGH (ref 11.5–15.5)
WBC: 8.7 10*3/uL (ref 4.0–10.5)

## 2011-11-29 LAB — PROTIME-INR
INR: 1.3 (ref 0.00–1.49)
Prothrombin Time: 16.4 seconds — ABNORMAL HIGH (ref 11.6–15.2)

## 2011-11-29 MED ORDER — WARFARIN SODIUM 10 MG PO TABS
10.0000 mg | ORAL_TABLET | Freq: Once | ORAL | Status: AC
  Administered 2011-11-29: 10 mg via ORAL
  Filled 2011-11-29: qty 1

## 2011-11-29 NOTE — Progress Notes (Signed)
ANTICOAGULATION CONSULT NOTE - Follow Up Consult  Pharmacy Consult for Heparin Indication: pulmonary embolus  Allergies  Allergen Reactions  . Lisinopril Swelling    Angioedema  . Aspirin Nausea And Vomiting    Patient Measurements: Height: 5\' 11"  (180.3 cm) Weight: 156 lb 8.4 oz (71 kg) IBW/kg (Calculated) : 75.3  Heparin Dosing Weight: 71.3 kg  Vital Signs: Temp: 97.8 F (36.6 C) (07/06 1551) Temp src: Oral (07/06 1551) BP: 113/45 mmHg (07/06 1551) Pulse Rate: 69  (07/06 1400)  Labs:  Basename 11/29/11 1509 11/29/11 0530 11/28/11 1117 11/28/11 0330 11/27/11 0615  HGB -- 12.2* -- 11.4* --  HCT -- 38.6* -- 36.0* --  PLT -- 161 -- 160 --  APTT -- -- -- -- --  LABPROT -- 16.4* -- 18.6* 19.5*  INR -- 1.30 -- 1.52* 1.62*  HEPARINUNFRC 0.51 0.16* 0.69 -- --  CREATININE -- -- -- 1.03 1.09  CKTOTAL -- -- -- -- --  CKMB -- -- -- -- --  TROPONINI -- -- -- -- --    Estimated Creatinine Clearance: 56.5 ml/min (by C-G formula based on Cr of 1.03).   Medications:  Infusions:     . heparin 850 Units/hr (11/29/11 0703)  . DISCONTD: heparin 750 Units/hr (11/28/11 1400)    Assessment: 78 yo male on IV heparin with therapeutic heparin level now s/p dose increase to 850 units/hr this am.  No bleeding or complications noted.  Goal of Therapy:  Heparin level 0.3-0.7 Monitor platelets by anticoagulation protocol: Yes   Plan:  -continue same heparin  -f/u am HL  Truly Stankiewicz L. Illene Bolus, PharmD, BCPS Clinical Pharmacist Pager: [phone] 11/29/2011 4:25 PM

## 2011-11-29 NOTE — Progress Notes (Signed)
Name: [name] [name] MRN: [id number] DOB: Oct 09, 1927  LOS: 3  CRITICAL CARE CONSULT NOTE  History of Present Illness: This is a 78 y/o male with GOLD stage IV COPD on home oxygen at night, pulmonary embolism, and pulmonary hypertension was admitted on 11/26/2011 to [facility] with shortness of breath.    Antibiotics: 11/27/2011 doxycycline (AE COPD) >>  Tests / Events: 11/27/2011 CT chest (I am unable to review images) small left lower lobe sub-segmental pulmonary embolism, Stanford A aortic dissection with ulceration, severe emphysema     Vital Signs:   Filed Vitals:   11/29/11 0423 11/29/11 0500 11/29/11 0700 11/29/11 0833  BP: 114/60     Pulse: 85  85   Temp: 97.5 F (36.4 C)  97.4 F (36.3 C)   TempSrc: Oral  Oral   Resp: 25  22   Height:      Weight:  71 kg (156 lb 8.4 oz)    SpO2: 100%  99% 100%  O2: 2 L [place]  Physical Examination: Gen: comfortable, sitting up in chair HEENT: NCAT, eomi PULM: diminished air entry but no resp distress, no wheezing CV: distant AB: nontender, nondistended Ext: warm, clubbing noted  Labs and Imaging:   CBC    Component Value Date/Time   WBC 8.7 11/29/2011 0530   RBC 4.65 11/29/2011 0530   HGB 12.2* 11/29/2011 0530   HCT 38.6* 11/29/2011 0530   PLT 161 11/29/2011 0530   MCV 83.0 11/29/2011 0530   MCH 26.2 11/29/2011 0530   MCHC 31.6 11/29/2011 0530   RDW 16.1* 11/29/2011 0530   LYMPHSABS 1.3 11/27/2011 0053   MONOABS 0.6 11/27/2011 0053   EOSABS 0.4 11/27/2011 0053   BASOSABS 0.0 11/27/2011 0053    BMET    Component Value Date/Time   NA 141 11/28/2011 0330    ABG    Component Value Date/Time   PHART 7.315* 11/28/2011 1702   PCO2ART 49.0* 11/28/2011 1702   PO2ART 214.0* 11/28/2011 1702   HCO3 31.3* 11/28/2011 1723   TCO2 33 11/28/2011 1723   ACIDBASEDEF 2.0 11/28/2011 1702   O2SAT 73.0 11/28/2011 1723     Assessment and Plan: This is an 78 y/o male with GOLD stage IV COPD who has an acute exacerbation of COPD.  The PE is old and unchanged.   FeV1 24% pred 04/2011  spiro.  Acute exacerbation of chronic obstructive pulmonary disease (COPD) (11/27/2011) Assessment: very severe COPD, I would say most if not all of his dyspnea is d/t severe Gold stage IV COPD  FeV1 24% Note this pt is followed in the office by Coralyn Helling  Isuspect the pulmonary HTN is d/t Copd Plan:  -He looks great to me, just waiting for INR to be therapeutic -if he goes home this weekend, send him home with the following regimen: -Take 40mg  po daily for 3 days, then take 30mg  po daily for 3 days, then take 20mg  po daily for two days, then take 10mg  po daily for 2 days -doxycycline 100mg  po bid for a total of 7 days (including doses in hospital)  -I am making arrangements for a hospital follow up appointment with our office    Hypoxemia (11/27/2011) Assessment: due to COPD Plan:  -titrate to O2 sat > 88%  History of pulmonary embolus (07/17/2009) Assessment: PE is chronic  Plan:  -maintain coumadin -for RHC results noted   Dissection of ascending aorta (11/26/2011) Assessment:  This is not acute. Plan Per cardiology    We will see  again on Monday if still in house  Yolonda Kida PCCM Pager: [phone] Cell: [phone] If no response, call [phone]

## 2011-11-29 NOTE — Progress Notes (Signed)
ANTICOAGULATION CONSULT NOTE - Follow Up Consult  Pharmacy Consult for Heparin/Coumadin Indication: pulmonary embolus  Allergies  Allergen Reactions  . Lisinopril Swelling    Angioedema  . Aspirin Nausea And Vomiting    Patient Measurements: Height: 5\' 11"  (180.3 cm) Weight: 156 lb 8.4 oz (71 kg) IBW/kg (Calculated) : 75.3  Heparin Dosing Weight: 71.3 kg  Vital Signs: Temp: 97.4 F (36.3 C) (07/06 0700) Temp src: Oral (07/06 0700) BP: 114/60 mmHg (07/06 0423) Pulse Rate: 85  (07/06 0700)  Labs:  Basename 11/29/11 0530 11/28/11 1117 11/28/11 0330 11/27/11 0615  HGB 12.2* -- 11.4* --  HCT 38.6* -- 36.0* --  PLT 161 -- 160 --  APTT -- -- -- --  LABPROT 16.4* -- 18.6* 19.5*  INR 1.30 -- 1.52* 1.62*  HEPARINUNFRC 0.16* 0.69 0.48 --  CREATININE -- -- 1.03 1.09  CKTOTAL -- -- -- --  CKMB -- -- -- --  TROPONINI -- -- -- --    Estimated Creatinine Clearance: 56.5 ml/min (by C-G formula based on Cr of 1.03).   Medications:  Infusions:     . heparin 850 Units/hr (11/29/11 0703)  . DISCONTD: heparin 850 Units/hr (11/28/11 0448)  . DISCONTD: heparin 750 Units/hr (11/28/11 1400)    Assessment: 78 yo male on IV heparin with subtherapeutic level this AM, dose increased to 850 units/hr.  Repeat level at 1500.  Also on chronic Coumadin for hx PE, Coumadin 10 mg dose ordered 7/4, but it was never charted, unsure if given. Dose of 10 mg given 7/5.  INR remains subtherapeutic today at 1.3.  No bleeding or complications noted.  Goal of Therapy:  INR 2-3 Heparin level 0.3-0.7 Monitor platelets by anticoagulation protocol: Yes   Plan:  1. F/U heparin level at 1500. 2. Repeat Coumadin 10 mg po x 1 tonight. 4. Daily PT/INR, CBC and heparin level.  Coumba Kellison C 11/29/2011,10:51 AM

## 2011-11-29 NOTE — Progress Notes (Signed)
Patient ID: ZAEL SHUMAN, male   DOB: January 15, 1928, 78 y.o.   MRN: [id number]   Patient Name: [name] [name] Date of Encounter: 11/29/2011    SUBJECTIVE  Sitting in chair and dyspneic his O2 sats 98% on O2. Appreciate pulmonary's input and insight.  Wants to  increaseactivity some. Right heart cath findings noted.  CURRENT MEDS    . albuterol  2.5 mg Nebulization Q4H  . carvedilol  12.5 mg Oral BID WC  . doxycycline  100 mg Oral Q12H  . feeding supplement  237 mL Oral BID BM  . fluticasone  2 spray Each Nare Daily  . furosemide  20 mg Oral Daily  . heparin      . ipratropium  0.5 mg Nebulization Q4H  . lidocaine      . multivitamin with minerals  1 tablet Oral Daily  . predniSONE  40 mg Oral Q breakfast  . sodium chloride  3 mL Intravenous Q12H  . sodium chloride  3 mL Intravenous Q12H  . warfarin  10 mg Oral ONCE-1800  . warfarin  10 mg Oral Once  . warfarin  10 mg Oral ONCE-1800  . Warfarin - Pharmacist Dosing Inpatient   Does not apply q1800  . DISCONTD: albuterol  2.5 mg Nebulization Q6H  . DISCONTD: ipratropium  0.5 mg Nebulization Q6H  . DISCONTD: sodium chloride  3 mL Intravenous Q12H    OBJECTIVE  Filed Vitals:   11/29/11 0423 11/29/11 0500 11/29/11 0700 11/29/11 0833  BP: 114/60     Pulse: 85  85   Temp: 97.5 F (36.4 C)  97.4 F (36.3 C)   TempSrc: Oral  Oral   Resp: 25  22   Height:      Weight:  156 lb 8.4 oz (71 kg)    SpO2: 100%  99% 100%    Intake/Output Summary (Last 24 hours) at 11/29/11 1058 Last data filed at 11/29/11 0700  Gross per 24 hour  Intake    772 ml  Output    550 ml  Net    222 ml   Filed Weights   11/27/11 0500 11/28/11 0450 11/29/11 0500  Weight: 156 lb 12 oz (71.1 kg) 157 lb 3 oz (71.3 kg) 156 lb 8.4 oz (71 kg)    PHYSICAL EXAM  General: Pleasant, NAD. Chronically ill-appearing. Neuro: Alert and oriented X 3. Moves all extremities spontaneously. Psych: Normal affect. HEENT:  Normal  Neck: Supple without bruits or  JVD. Lungs: Decreased breath sounds throughout. Heart: RRR no s3, s4, or murmurs. Abdomen: Soft, non-tender, non-distended, BS + x 4.  Extremities: No clubbing, cyanosis or edema. DP/PT/Radials 2+ and equal bilaterally.  Accessory Clinical Findings  CBC  Basename 11/29/11 0530 11/28/11 0330 11/27/11 0053  WBC 8.7 7.0 --  NEUTROABS -- -- 2.7  HGB 12.2* 11.4* --  HCT 38.6* 36.0* --  MCV 83.0 81.6 --  PLT 161 160 --   Basic Metabolic Panel  Basename 11/28/11 0330 11/27/11 0615  NA 141 142  K 4.2 4.1  CL 105 105  CO2 29 28  GLUCOSE 106* 113*  BUN 17 18  CREATININE 1.03 1.09  CALCIUM 9.2 9.3  MG -- --  PHOS -- --   Liver Function Tests  Basename 11/27/11 0615  AST 16  ALT 7  ALKPHOS 68  BILITOT 0.4  PROT 6.1  ALBUMIN 3.2*   No results found for this basename: LIPASE:2,AMYLASE:2 in the last 72 hours Cardiac Enzymes No results found for  this basename: CKTOTAL:3,CKMB:3,CKMBINDEX:3,TROPONINI:3 in the last 72 hours BNP No components found with this basename: POCBNP:3 D-Dimer  Basename 11/27/11 0053  DDIMER 2.00*   Hemoglobin A1C No results found for this basename: HGBA1C in the last 72 hours Fasting Lipid Panel No results found for this basename: CHOL,HDL,LDLCALC,TRIG,CHOLHDL,LDLDIRECT in the last 72 hours Thyroid Function Tests No results found for this basename: TSH,T4TOTAL,FREET3,T3FREE,THYROIDAB in the last 72 hours  TELE  Paced  ECG    Radiology/Studies  Ct Angio Chest W/cm &/or Wo Cm  11/26/2011  Angio Chest w/wo contrast 785.05 Ordered for 11-26-2011 patient notified to go to the [facility]    Dg Chest Port 1 View  11/28/2011  *RADIOLOGY REPORT*  Clinical Data: Evaluate for airspace disease.  PORTABLE CHEST - 1 VIEW  Comparison: Chest x-ray 10/31/2011.  Findings: Lungs appear hyperexpanded with pruning of the pulmonary vasculature in the periphery.  No consolidative airspace disease. No definite pleural effusions (left costophrenic sulcus is excluded  from the lower margin of the image).  Pulmonary vasculature is within normal limits.  Heart size is borderline enlarged (unchanged).  Mediastinal contours are unremarkable.  The sided by a ventricular pacemaker / AICD in place with lead tips projecting over the expected location of the right atrium, right ventricle, and lateral Sh[place]un Wordell of the left ventricle (via the coronary sinus and coronary veins).  Calcified granuloma in the right suprahilar region is again noted.  IMPRESSION: 1.  No radiographic evidence of acute cardiopulmonary disease. 2.  Chronic changes related to COPD, as above.  Original Report Authenticated By: Florencia Reasons, M.D.    ASSESSMENT AND PLAN  Active Problems:  DYSPNEA  History of pulmonary embolus  Dissection of ascending aorta  Acute exacerbation of chronic obstructive pulmonary disease (COPD)  Hypoxemia    His primary rate relating illnesses severe COPD. Continue per pulmonary recommendations. He needs an INR of 2 for 24 hours before discontinuing heparin. We'll increase activity as tolerated.  Signed, Valera Castle MD

## 2011-11-29 NOTE — Progress Notes (Signed)
ANTICOAGULATION CONSULT NOTE - Follow Up Consult  Pharmacy Consult for heparin Indication: pulmonary embolus  Labs:  Basename 11/29/11 0530 11/28/11 1117 11/28/11 0330 11/27/11 0615  HGB 12.2* -- 11.4* --  HCT 38.6* -- 36.0* --  PLT 161 -- 160 --  APTT -- -- -- --  LABPROT 16.4* -- 18.6* 19.5*  INR 1.30 -- 1.52* 1.62*  HEPARINUNFRC 0.16* 0.69 0.48 --  CREATININE -- -- 1.03 1.09  CKTOTAL -- -- -- --  CKMB -- -- -- --  TROPONINI -- -- -- --    Assessment/Plan: 78yo male subtherapeutic on heparin after resumed for PE; has previously been therapeutic at 850 units/hr so will increase to this rate and check level in 8hr.  Colleen Can PharmD BCPS 11/29/2011,7:03 AM

## 2011-11-30 LAB — CBC
Hemoglobin: 11.4 g/dL — ABNORMAL LOW (ref 13.0–17.0)
MCV: 81.7 fL (ref 78.0–100.0)
Platelets: 147 10*3/uL — ABNORMAL LOW (ref 150–400)
RBC: 4.43 MIL/uL (ref 4.22–5.81)
WBC: 9 10*3/uL (ref 4.0–10.5)

## 2011-11-30 LAB — PROTIME-INR: Prothrombin Time: 20 seconds — ABNORMAL HIGH (ref 11.6–15.2)

## 2011-11-30 LAB — HEPARIN LEVEL (UNFRACTIONATED): Heparin Unfractionated: 0.55 IU/mL (ref 0.30–0.70)

## 2011-11-30 MED ORDER — WARFARIN SODIUM 10 MG PO TABS
10.0000 mg | ORAL_TABLET | Freq: Once | ORAL | Status: AC
  Administered 2011-11-30: 10 mg via ORAL
  Filled 2011-11-30: qty 1

## 2011-11-30 MED ORDER — PATIENT'S GUIDE TO USING COUMADIN BOOK
Freq: Once | Status: AC
  Administered 2011-11-30: 16:00:00
  Filled 2011-11-30: qty 1

## 2011-11-30 NOTE — Progress Notes (Signed)
Patient ID: SNEIJDER BERNARDS, male   DOB: [birth year], 78 y.o.   MRN: [id number]   Patient Name: DEMECO DUCKSWORTH Date of Encounter: 11/30/2011    SUBJECTIVE  Breathing a little better. Wants Real food!! INR creeping up.  CURRENT MEDS    . albuterol  2.5 mg Nebulization Q4H  . carvedilol  12.5 mg Oral BID WC  . doxycycline  100 mg Oral Q12H  . feeding supplement  237 mL Oral BID BM  . fluticasone  2 spray Each Nare Daily  . furosemide  20 mg Oral Daily  . ipratropium  0.5 mg Nebulization Q4H  . multivitamin with minerals  1 tablet Oral Daily  . predniSONE  40 mg Oral Q breakfast  . sodium chloride  3 mL Intravenous Q12H  . sodium chloride  3 mL Intravenous Q12H  . warfarin  10 mg Oral ONCE-1800  . Warfarin - Pharmacist Dosing Inpatient   Does not apply q1800    OBJECTIVE  Filed Vitals:   11/30/11 0745 11/30/11 0802 11/30/11 0852 11/30/11 0900  BP: 131/68     Pulse: 85   70  Temp:  98.1 F (36.7 C)    TempSrc:  Oral    Resp:    23  Height:      Weight:      SpO2: 100%  100% 100%    Intake/Output Summary (Last 24 hours) at 11/30/11 1018 Last data filed at 11/30/11 0900  Gross per 24 hour  Intake  795.5 ml  Output   2350 ml  Net -1554.5 ml   Filed Weights   11/28/11 0450 11/29/11 0500 11/30/11 0328  Weight: 157 lb 3 oz (71.3 kg) 156 lb 8.4 oz (71 kg) 158 lb 4.6 oz (71.8 kg)    PHYSICAL EXAM  General: Pleasant, NAD. Neuro: Alert and oriented X 3. Moves all extremities spontaneously. Psych: Normal affect. HEENT:  Normal  Neck: Supple without bruits or JVD. Lungs: Mildly dyspneic. Markedly decreased BS throughout.Marland Kitchen Heart: RRR no s3, s4, or murmurs. Abdomen: Soft, non-tender, non-distended, BS + x 4.  Extremities: No clubbing, cyanosis or edema. DP/PT/Radials 2+ and equal bilaterally.  Accessory Clinical Findings  CBC  Basename 11/30/11 0630 11/29/11 0530  WBC 9.0 8.7  NEUTROABS -- --  HGB 11.4* 12.2*  HCT 36.2* 38.6*  MCV 81.7 83.0  PLT 147* 161   Basic  Metabolic Panel  Basename 11/28/11 0330  NA 141  K 4.2  CL 105  CO2 29  GLUCOSE 106*  BUN 17  CREATININE 1.03  CALCIUM 9.2  MG --  PHOS --   Liver Function Tests No results found for this basename: AST:2,ALT:2,ALKPHOS:2,BILITOT:2,PROT:2,ALBUMIN:2 in the last 72 hours No results found for this basename: LIPASE:2,AMYLASE:2 in the last 72 hours Cardiac Enzymes No results found for this basename: CKTOTAL:3,CKMB:3,CKMBINDEX:3,TROPONINI:3 in the last 72 hours BNP No components found with this basename: POCBNP:3 D-Dimer No results found for this basename: DDIMER:2 in the last 72 hours Hemoglobin A1C No results found for this basename: HGBA1C in the last 72 hours Fasting Lipid Panel No results found for this basename: CHOL,HDL,LDLCALC,TRIG,CHOLHDL,LDLDIRECT in the last 72 hours Thyroid Function Tests No results found for this basename: TSH,T4TOTAL,FREET3,T3FREE,THYROIDAB in the last 72 hours  TELE  Paced  ECG   Radiology/Studies  Ct Angio Chest W/cm &/or Wo Cm  11/26/2011  Angio Chest w/wo contrast 785.05 Ordered for 11-26-2011 patient notified to go to the [facility]    Dg Chest Port 1 View  11/28/2011  *RADIOLOGY REPORT*  Clinical Data: Evaluate for airspace disease.  PORTABLE CHEST - 1 VIEW  Comparison: Chest x-ray 10/31/2011.  Findings: Lungs appear hyperexpanded with pruning of the pulmonary vasculature in the periphery.  No consolidative airspace disease. No definite pleural effusions (left costophrenic sulcus is excluded from the lower margin of the image).  Pulmonary vasculature is within normal limits.  Heart size is borderline enlarged (unchanged).  Mediastinal contours are unremarkable.  The sided by a ventricular pacemaker / AICD in place with lead tips projecting over the expected location of the right atrium, right ventricle, and lateral Ranyia Witting of the left ventricle (via the coronary sinus and coronary veins).  Calcified granuloma in the right suprahilar region is again  noted.  IMPRESSION: 1.  No radiographic evidence of acute cardiopulmonary disease. 2.  Chronic changes related to COPD, as above.  Original Report Authenticated By: Florencia Reasons, M.D.    ASSESSMENT AND PLAN  Active Problems:  DYSPNEA  History of pulmonary embolus  Dissection of ascending aorta  Acute exacerbation of chronic obstructive pulmonary disease (COPD)  Hypoxemia    Change to regular diet per his request. Ensure supplement added as well. Discharge once INR is 2 for 24 hours.  Signed, Valera Castle MD

## 2011-11-30 NOTE — Progress Notes (Signed)
ANTICOAGULATION CONSULT NOTE - Follow Up Consult  Pharmacy Consult for Heparin/Coumadin Indication: pulmonary embolus  Allergies  Allergen Reactions  . Lisinopril Swelling    Angioedema  . Aspirin Nausea And Vomiting    Patient Measurements: Height: 5\' 11"  (180.3 cm) Weight: 158 lb 4.6 oz (71.8 kg) IBW/kg (Calculated) : 75.3  Heparin Dosing Weight: 71.3 kg  Vital Signs: Temp: 97.8 F (36.6 C) (07/07 1140) Temp src: Oral (07/07 1140) BP: 117/53 mmHg (07/07 1142) Pulse Rate: 70  (07/07 1000)  Labs:  Basename 11/30/11 0630 11/29/11 1509 11/29/11 0530 11/28/11 0330  HGB 11.4* -- 12.2* --  HCT 36.2* -- 38.6* 36.0*  PLT 147* -- 161 160  APTT -- -- -- --  LABPROT 20.0* -- 16.4* 18.6*  INR 1.67* -- 1.30 1.52*  HEPARINUNFRC 0.55 0.51 0.16* --  CREATININE -- -- -- 1.03  CKTOTAL -- -- -- --  CKMB -- -- -- --  TROPONINI -- -- -- --    Estimated Creatinine Clearance: 57.1 ml/min (by C-G formula based on Cr of 1.03).   Medications:  Infusions:     . heparin 850 Units/hr (11/30/11 0332)    Assessment: 78 yo male on IV heparin for hx PE (?) new PE, therapeutic level this AM on 850 units/hr. Also on chronic Coumadin for hx PE, Coumadin 10 mg dose ordered 7/4, but it was never charted, unsure if given. Dose of 10 mg given 7/5., 7/6.  INR rising today to 1.67.  No bleeding or complications noted.  Today is Day #3 of 5 for VTE overlap (assuming PE is new).  Noted plans to continue bridge until INR > 2 x 24 hrs.  Goal of Therapy:  INR 2-3 Heparin level 0.3-0.7 Monitor platelets by anticoagulation protocol: Yes   Plan:  1. Continue IV heparin at current rate of 850 units/hr.  2. Repeat Coumadin 10 mg po x 1 tonight. 3. Continue Daily PT/INR, CBC and heparin level.  Crockett Rallo C 11/30/2011,1:48 PM

## 2011-12-01 ENCOUNTER — Encounter (HOSPITAL_COMMUNITY): Payer: Self-pay | Admitting: Cardiology

## 2011-12-01 DIAGNOSIS — J9620 Acute and chronic respiratory failure, unspecified whether with hypoxia or hypercapnia: Principal | ICD-10-CM

## 2011-12-01 DIAGNOSIS — Z950 Presence of cardiac pacemaker: Secondary | ICD-10-CM

## 2011-12-01 DIAGNOSIS — I442 Atrioventricular block, complete: Secondary | ICD-10-CM

## 2011-12-01 LAB — CBC
HCT: 36.3 % — ABNORMAL LOW (ref 39.0–52.0)
Platelets: 140 10*3/uL — ABNORMAL LOW (ref 150–400)
RBC: 4.4 MIL/uL (ref 4.22–5.81)
RDW: 15.9 % — ABNORMAL HIGH (ref 11.5–15.5)
WBC: 8.7 10*3/uL (ref 4.0–10.5)

## 2011-12-01 LAB — HEPARIN LEVEL (UNFRACTIONATED): Heparin Unfractionated: 0.54 IU/mL (ref 0.30–0.70)

## 2011-12-01 LAB — PROTIME-INR: INR: 2.1 — ABNORMAL HIGH (ref 0.00–1.49)

## 2011-12-01 MED ORDER — GUAIFENESIN 200 MG PO TABS
1200.0000 mg | ORAL_TABLET | Freq: Two times a day (BID) | ORAL | Status: DC
  Filled 2011-12-01 (×2): qty 6

## 2011-12-01 MED ORDER — GUAIFENESIN ER 600 MG PO TB12
1200.0000 mg | ORAL_TABLET | Freq: Two times a day (BID) | ORAL | Status: DC
  Administered 2011-12-01: 1200 mg via ORAL
  Filled 2011-12-01 (×2): qty 2

## 2011-12-01 MED ORDER — CARVEDILOL 12.5 MG PO TABS: 12.5000 mg | ORAL_TABLET | Freq: Two times a day (BID) | ORAL | Status: DC

## 2011-12-01 MED ORDER — GUAIFENESIN ER 600 MG PO TB12: 1200.0000 mg | ORAL_TABLET | Freq: Two times a day (BID) | ORAL | Status: DC

## 2011-12-01 MED ORDER — WARFARIN SODIUM 5 MG PO TABS: ORAL_TABLET | ORAL | Status: DC

## 2011-12-01 MED ORDER — GUAIFENESIN-DM 100-10 MG/5ML PO SYRP
15.0000 mL | ORAL_SOLUTION | ORAL | Status: DC | PRN
  Administered 2011-12-01: 15 mL via ORAL
  Filled 2011-12-01: qty 15

## 2011-12-01 MED ORDER — WARFARIN SODIUM 7.5 MG PO TABS
7.5000 mg | ORAL_TABLET | Freq: Once | ORAL | Status: AC
  Administered 2011-12-01: 7.5 mg via ORAL
  Filled 2011-12-01: qty 1

## 2011-12-01 MED ORDER — PREDNISONE 20 MG PO TABS: ORAL_TABLET | ORAL | Status: DC

## 2011-12-01 MED ORDER — DOXYCYCLINE HYCLATE 100 MG PO TABS: 100.0000 mg | ORAL_TABLET | Freq: Two times a day (BID) | ORAL | Status: AC

## 2011-12-01 NOTE — Discharge Summary (Signed)
Copd exacdrabation

## 2011-12-01 NOTE — Discharge Summary (Signed)
Discharge Summary   Patient ID: [name] [name] MRN: [id number], DOB/AGE: Apr 29, 1928 78 y.o.  Primary MD: Kirstie Peri, MD Primary Cardiologist: Dr. Andee Lineman Admit date: 11/26/2011 D/C date:     12/01/2011      Primary Discharge Diagnoses:  1. Acute on Chronic Respiratory Failure  - Primarily due to severe COPD   - RHC 11/28/11 revealed minimally elevated PA pressures, nl wedge/cardiac output  - Improved with steroids, bronchodilators, and abx  2. Acute COPD Exacerbation  - GOLD stage IV COPD on home oxygen  - Should complete 7 day course of doxycycline (last dose on 12/03/11)  3. Chronic Pulmonary Embolism  - Small LLL sub-segmental PE seen on CT 11/2011, unchanged from CT in May 2013  - Chronic anticoagulation with coumadin, INR 2.1 at discharge  4. Stanford A Aortic Dissection  - Localized ascending aortic dissection seen on CT 11/2011, unchanged from CT in May 2013, ? artifact  - No indication for surgical repair  Secondary Discharge Diagnoses:  1. Nonischemic Cardiomyopathy (no significant CAD by cath 09/2010) 2. Complete Heart Block  s/p [employer] BiV ICD 3. Pulmonary Hypertension 4. Hyperlipidemia 5. Hypertension 6. H/o Tobacco Abuse - Quit in the 1990s 7. Peripheral Neuropathy 8. Arthritis 9. Prostate Cancer s/p Prostatectomy 2008   Allergies Allergies  Allergen Reactions  . Lisinopril Swelling    Angioedema  . Aspirin Nausea And Vomiting    Diagnostic Studies/Procedures:   11/28/11 - Right Heart Cath Findings:  RA = 6  RV = 36/2/8  PA = 42/8 (23)  PCW = 9  Fick cardiac output/index = 6.2/3.3  PVR = 2.3 Woods  FA sat = 100%  PA sat = 73%, 75%  Assessment:  1) Minimally elevated PA pressures. No change from previous  2) Normal wedge and cardiac output  Plan/Discussion:  RHC essentially normal. Suspect dyspnea due to COPD. No further cardiac w/u indicated at this point  11/26/2011 - Ct Angio Chest W/cm &/or Wo Cm Impression: Small embolus in the antrum medial  segment left lower lobe. Small focal dissection of the ascending thoracic aorta (Stanford A) likely with a small adjacent penetrating ulcer. No acute intramural hematoma.   History of Present Illness: 78 y.o. male w/ the above medical problems who transferred from [facility] to [facility] on 11/26/11 for dyspnea in the setting of PE and aortic dissection.  He was hospitalized in March at [facility] with pneumonia and has complained of severe dyspnea since then. He has been followed in both the cardiology clinic as well as the pulmonary clinic. His echocardiogram done recently showed an ejection fraction of 45-50% and a suggestion of more severe pulmonary hypertension. He had an elevated d-dimer and therefore a CT scan was ordered. It was completed on the day of presentation and showed the presence of a focal dissection of the aorta as well as a small pulmonary embolus for which he was transferred to [facility] Course: The CT films were reviewed with the radiologist and it was noted that these findings were present on a previous CT scan earlier this year. He was not having chest pain and was hemodynamically stable. His INR was subtherapeutic at 1.3 on admission so he was continued on coumadin and started on heparin.  He was evaluated by Dr. Morton Peters who reviewed the CT films and felt the reported ascending aortic dissection could be possible artifact versus a small localized penetrating ulcer. He felt given the patient's age and  severe COPD the CT scan findings should be treated conservatively.   Pulmonary evaluated him and felt his dyspnea was primarily from acute on chronic COPD exacerbation. He was placed on steroids, bronchodilators, and antibiotics. Right heart cath on 11/28/11 revealed minimally elevated PA pressures, unchanged from prior RHC. He tolerated the procedure without complications. It was again noted that his dyspnea was primarily due to severe  COPD and felt that there was no indication for further cardiac evaluation. Pulmonary outpatient follow up was arranged and it was recommended he complete 7 day course of doxycycline (last dose on 12/03/11), prednisone taper, and guaifenesin.   He was kept in the hospital until INR was therapeutic. On day of discharge INR was 2.1. Heparin was discontinued and he was continued on chronic coumadin with dosing per inpatient pharmacy recommendations. He was able to ambulate on 2L O2 without complaints of dyspnea and O2 Sats >88%. He was seen and evaluated by Dr. Graciela Husbands who felt he was stable for discharge home with plans for follow up as scheduled below. Discharge weight 159lbs.   Discharge Vitals: Blood pressure 120/57, pulse 70, temperature 97.9 F (36.6 C), temperature source Oral, resp. rate 20, height 5\' 11"  (1.803 m), weight 159 lb 6.3 oz (72.3 kg), SpO2 96.00%.  Labs: Lab Results  Component Value Date   WBC 8.7 12/01/2011   HGB 11.4* 12/01/2011   HCT 36.3* 12/01/2011   MCV 82.5 12/01/2011   PLT 140* 12/01/2011     Lab 11/28/11 0330 11/27/11 0615  NA 141 --  K 4.2 --  CL 105 --  CO2 29 --  BUN 17 --  CREATININE 1.03 --  CALCIUM 9.2 --  PROT -- 6.1  BILITOT -- 0.4  ALKPHOS -- 68  ALT -- 7  AST -- 16  GLUCOSE 106* --   Lab Results  Component Value Date   DDIMER 2.00* 11/27/2011     11/27/2011 00:54  Pro B Natriuretic peptide (BNP) 385.6     12/01/2011 04:50  Prothrombin Time 23.9 (H)  INR 2.10 (H)     Discharge Medications   Medication List  As of 12/01/2011  5:14 PM   TAKE these medications         albuterol (2.5 MG/3ML) 0.083% nebulizer solution   Commonly known as: PROVENTIL   Take 3 mLs (2.5 mg total) by nebulization every 6 (six) hours as needed for wheezing or shortness of breath.      albuterol 108 (90 BASE) MCG/ACT inhaler   Commonly known as: PROVENTIL HFA;VENTOLIN HFA   Inhale 2 puffs into the lungs every 6 (six) hours as needed for wheezing or shortness of breath.       carvedilol 12.5 MG tablet   Commonly known as: COREG   Take 1 tablet (12.5 mg total) by mouth 2 (two) times daily with a meal. Take 1 tablet by mouth twice per day.      doxycycline 100 MG tablet   Commonly known as: VIBRA-TABS   Take 1 tablet (100 mg total) by mouth every 12 (twelve) hours.      flunisolide 0.025 % Soln   Commonly known as: NASALIDE   Place 2 sprays into the nose daily.      furosemide 40 MG tablet   Commonly known as: LASIX   Take 20 mg by mouth daily as needed. For excess fluid retention      guaiFENesin 600 MG 12 hr tablet   Commonly known as: MUCINEX   Take 2 tablets (  1,200 mg total) by mouth 2 (two) times daily.      multivitamin with minerals Tabs   Take 1 tablet by mouth daily.      predniSONE 20 MG tablet   Commonly known as: DELTASONE   Take 30mg  tabs daily for 3 days starting 12/02/11, then 20mg  daily for 2 days, then 10mg  daily for 2 days.      tiotropium 18 MCG inhalation capsule   Commonly known as: SPIRIVA   Place 1 capsule (18 mcg total) into inhaler and inhale daily.      warfarin 5 MG tablet   Commonly known as: COUMADIN   Take 7.5 mg tonight (12/01/11), 10mg  tomorrow (12/02/11) and 7.5mg  (12/03/11). Please have your INR checked by Wednesday the 10th for further dosing recommendations.            Disposition   Discharge Orders    Future Appointments: Provider: Department: Dept Phone: Center:   12/12/2011 10:30 AM Julio Sicks, NP [facility] [phone] None     Future Orders Please Complete By Expires   Diet - low sodium heart healthy      Increase activity slowly      Discharge instructions      Comments:   **PLEASE REMEMBER TO BRING ALL OF YOUR MEDICATIONS TO EACH OF YOUR FOLLOW-UP OFFICE VISITS.  * Please take 7.5 mg coumadin tonight (12/01/11), 10mg  tomorrow (12/02/11) and 7.5mg  (12/03/11). Please have your INR checked by Wednesday the 10th for further dosing recommendations.  * KEEP GROIN SITE CLEAN AND DRY. Call the office  for any signs of bleedings, pus, swelling, increased pain, or any other concerns.     Follow-up Information    Follow up with [facility], MD on 12/03/2011. (Please have your INR checked)    Contact information:   [address]  [place] [address] [phone]       Follow up with [facility], NP on 12/12/2011. (10:30)    Contact information:   [facility] [address] [place] [address] [phone]       Follow up with Peyton Bottoms, MD in 5 weeks. (Our office will call you with appointment time)    Contact information:   [facility] [address] [place] [place] [address] [phone]           Outstanding Labs/Studies:  1. INR  Duration of Discharge Encounter: Greater than 30 minutes including physician and PA time.  Signed, Norrine Ballester PA-C 12/01/2011, 5:14 PM

## 2011-12-01 NOTE — Telephone Encounter (Signed)
lmomtcb  

## 2011-12-01 NOTE — Progress Notes (Signed)
  Patient Name: [name] [name]      SUBJECTIVE: feeling better this am with baseline sob; difficulty in mobiizing excetions  Past Medical History  Diagnosis Date  . COPD (chronic obstructive pulmonary disease)     Chronic DOE  . Pulmonary embolism     Chronic Coumadin Therapy  . Nonischemic cardiomyopathy     Mild-EF 45-50%  . CHB (complete heart block)     S/P PTVP of CRT device and ICD upgrade in 2008  . Tobacco abuse   . Hypercholesterolemia   . Peripheral neuropathy   . Hypertension   . Arthritis   . Allergic rhinitis   . Prostate cancer     PHYSICAL EXAM Filed Vitals:   12/01/11 0323 12/01/11 0439 12/01/11 0532 12/01/11 0730  BP: 164/64   143/72  Pulse:    70  Temp: 98.1 F (36.7 C)   98.1 F (36.7 C)  TempSrc: Oral   Oral  Resp: 20  23 23   Height:      Weight:   159 lb 6.3 oz (72.3 kg)   SpO2: 99% 98%  96%    Well developed and nourished in no acute distress HENT normal Neck supple with JVP-flat Clear w some ronchi Regular rate and rhythm, no murmurs or gallops Abd-soft with active BS No Clubbing cyanosis edema Skin-warm and dry A & Oriented  Grossly normal sensory and motor function  TELEMETRY: Reviewed telemetry pt in afib:    Intake/Output Summary (Last 24 hours) at 12/01/11 0947 Last data filed at 12/01/11 0900  Gross per 24 hour  Intake 1267.01 ml  Output   1575 ml  Net -307.99 ml    LABS: Basic Metabolic Panel:  Lab 11/28/11 1610 11/27/11 0615  NA 141 142  K 4.2 4.1  CL 105 105  CO2 29 28  GLUCOSE 106* 113*  BUN 17 18  CREATININE 1.03 1.09  CALCIUM 9.2 9.3  MG -- --  PHOS -- --   Cardiac Enzymes: No results found for this basename: CKTOTAL:3,CKMB:3,CKMBINDEX:3,TROPONINI:3 in the last 72 hours CBC:  Lab 12/01/11 0450 11/30/11 0630 11/29/11 0530 11/28/11 0330 11/27/11 0053  WBC 8.7 9.0 8.7 7.0 --  NEUTROABS -- -- -- -- 2.7  HGB 11.4* 11.4* 12.2* 11.4* --  HCT 36.3* 36.2* 38.6* 36.0* --  MCV 82.5 81.7 83.0 81.6 --  PLT  140* 147* 161 160 --   PROTIME:  Basename 12/01/11 0450 11/30/11 0630 11/29/11 0530  LABPROT 23.9* 20.0* 16.4*  INR 2.10* 1.67* 1.30        ASSESSMENT AND PLAN:  Patient Active Hospital Problem List: DYSPNEA (02/21/2009)  History of pulmonary embolus (07/17/2009)  Dissection of ascending aorta (11/26/2011)  Acute exacerbation of chronic obstructive pulmonary disease (COPD) (11/27/2011)  Hypoxemia (11/27/2011)   Much improved Will discharge today followup with Dr Craige Cotta and Dr GD Antibiotics total 7 days Dr Haynes Hoehn to help with pulm med recs   Signed, Sherryl Manges MD  12/01/2011

## 2011-12-01 NOTE — Progress Notes (Signed)
ANTICOAGULATION CONSULT NOTE - Follow Up Consult  Pharmacy Consult for Heparin  Indication: pulmonary embolus  Allergies  Allergen Reactions  . Lisinopril Swelling    Angioedema  . Aspirin Nausea And Vomiting   Vital Signs: Temp: 98.1 F (36.7 C) (07/08 0730) Temp src: Oral (07/08 0730) BP: 143/72 mmHg (07/08 0730) Pulse Rate: 70  (07/08 0730)  Labs:  Basename 12/01/11 0450 11/30/11 0630 11/29/11 1509 11/29/11 0530  HGB 11.4* 11.4* -- --  HCT 36.3* 36.2* -- 38.6*  PLT 140* 147* -- 161  APTT -- -- -- --  LABPROT 23.9* 20.0* -- 16.4*  INR 2.10* 1.67* -- 1.30  HEPARINUNFRC 0.54 0.55 0.51 --  CREATININE -- -- -- --  CKTOTAL -- -- -- --  CKMB -- -- -- --  TROPONINI -- -- -- --    Estimated Creatinine Clearance: 57.5 ml/min (by C-G formula based on Cr of 1.03).   Medications:  Heparin @ 850 units/hr  Assessment: 83yom continues on heparin for hx PE and ? new PE with a therapeutic heparin level. Also on chronic coumadin for PE, INR finally therapeutic after several 10mg  doses. Can resume home dose today. No bleeding noted. Hgb/Hct stable with slight trend down in platelets.  Today is Day #4/5 for VTE overlap (assuming PE is new). Noted plan to continue heparin bridge until INR >2 x 24 hrs - can probably d/c tomorrow.  Goal of Therapy:  Heparin level 0.3-0.7 units/ml INR 2-3 Monitor platelets by anticoagulation protocol: Yes   Plan:  1) Coumadin 7.5mg  x 1 2) Continue heparin at 850 units/hr - f/u d/c 7/9 3) Follow up INR, heparin level, CBC in AM  Fredrik Rigger 12/01/2011,8:39 AM

## 2011-12-01 NOTE — Progress Notes (Signed)
Name: [name] [name] MRN: [id number] DOB: Sep 24, 1927  LOS: 5  CRITICAL CARE CONSULT NOTE  History of Present Illness: This is a 78 y/o male with GOLD stage IV COPD on home oxygen at night, pulmonary embolism, and pulmonary hypertension was admitted on 11/26/2011 to [facility] with shortness of breath.   Antibiotics: 11/27/2011 doxycycline (AE COPD) >>  Tests / Events: 11/27/2011 CT chest (I am unable to review images) small left lower lobe sub-segmental pulmonary embolism, Stanford A aortic dissection with ulceration, severe emphysema     Vital Signs:   Filed Vitals:   12/01/11 0323 12/01/11 0439 12/01/11 0532 12/01/11 0730  BP: 164/64   143/72  Pulse:    70  Temp: 98.1 F (36.7 C)   98.1 F (36.7 C)  TempSrc: Oral   Oral  Resp: 20  23 23   Height:      Weight:   72.3 kg (159 lb 6.3 oz)   SpO2: 99% 98%  96%  O2: 2 L [place]  Physical Examination: Gen: comfortable, sitting up in chair, eating HEENT: NCAT, eomi PULM: diminished air entry but no resp distress, no wheezing CV: distant improved AB: nontender, nondistended Ext: warm, clubbing noted  Labs and Imaging:   CBC    Component Value Date/Time   WBC 8.7 12/01/2011 0450   RBC 4.40 12/01/2011 0450   HGB 11.4* 12/01/2011 0450   HCT 36.3* 12/01/2011 0450   PLT 140* 12/01/2011 0450   MCV 82.5 12/01/2011 0450   MCH 25.9* 12/01/2011 0450   MCHC 31.4 12/01/2011 0450   RDW 15.9* 12/01/2011 0450   LYMPHSABS 1.3 11/27/2011 0053   MONOABS 0.6 11/27/2011 0053   EOSABS 0.4 11/27/2011 0053   BASOSABS 0.0 11/27/2011 0053    BMET    Component Value Date/Time   NA 141 11/28/2011 0330    ABG    Component Value Date/Time   PHART 7.315* 11/28/2011 1702   PCO2ART 49.0* 11/28/2011 1702   PO2ART 214.0* 11/28/2011 1702   HCO3 31.3* 11/28/2011 1723   TCO2 33 11/28/2011 1723   ACIDBASEDEF 2.0 11/28/2011 1702   O2SAT 73.0 11/28/2011 1723     Assessment and Plan: This is an 78 y/o male with GOLD stage IV COPD who has an acute exacerbation of COPD.  The PE is old and unchanged.    FeV1 24% pred 04/2011 spiro.  Acute exacerbation of chronic obstructive pulmonary disease (COPD) (11/27/2011) Assessment: very severe COPD, I would say most if not all of his dyspnea is d/t severe Gold stage IV COPD  FeV1 24% Note this pt is followed in the office by Coralyn Helling  Isuspect the pulmonary HTN is d/t Copd Plan:  -coumadin / heparin transition -if he goes home  -Take 40mg  po daily for 3 days, then take 30mg  po daily for 3 days, then take 20mg  po daily for two days, then take 10mg  po daily for 2 days -doxycycline 100mg  po bid for a total duration of 7 days -needs to see Dr Craige Cotta in next 1-2 weeks: July 19th with Tammy Parrett, 1030 am  Done for him Start guaifenesin 1200 mg po q12h x 5 days upon dc -ambulate and check sats pre dc  Hypoxemia (11/27/2011) Assessment: due to COPD Plan:  -titrate to O2 sat > 88% -assess with excertion pre dc  History of pulmonary embolus (07/17/2009) Assessment: PE is chronic  Plan:  -maintain coumadin -for RHC results noted -CT reviewed, very unimpressive small  Dissection of ascending aorta (11/26/2011) Assessment:  This  is not acute. Plan Per cardiology     Kizzie Bane PCCM Pager: [phone]

## 2011-12-02 NOTE — Telephone Encounter (Signed)
lmtcb--advised to call back if anything further was needed. Will sign off message per triage protocol

## 2011-12-03 ENCOUNTER — Encounter: Payer: Self-pay | Admitting: Pulmonary Disease

## 2011-12-12 ENCOUNTER — Ambulatory Visit (INDEPENDENT_AMBULATORY_CARE_PROVIDER_SITE_OTHER): Payer: Medicare Other | Admitting: Adult Health

## 2011-12-12 ENCOUNTER — Encounter: Payer: Self-pay | Admitting: Adult Health

## 2011-12-12 VITALS — BP 106/54 | HR 70 | Temp 97.1°F | Ht 71.5 in | Wt 164.2 lb

## 2011-12-12 DIAGNOSIS — G4734 Idiopathic sleep related nonobstructive alveolar hypoventilation: Secondary | ICD-10-CM

## 2011-12-12 DIAGNOSIS — R0902 Hypoxemia: Secondary | ICD-10-CM

## 2011-12-12 DIAGNOSIS — J4489 Other specified chronic obstructive pulmonary disease: Secondary | ICD-10-CM

## 2011-12-12 DIAGNOSIS — J449 Chronic obstructive pulmonary disease, unspecified: Secondary | ICD-10-CM

## 2011-12-12 DIAGNOSIS — Z86718 Personal history of other venous thrombosis and embolism: Secondary | ICD-10-CM

## 2011-12-12 MED ORDER — BUDESONIDE 0.25 MG/2ML IN SUSP: 0.2500 mg | Freq: Two times a day (BID) | RESPIRATORY_TRACT | Status: DC

## 2011-12-12 MED ORDER — TIOTROPIUM BROMIDE MONOHYDRATE 18 MCG IN CAPS: 18.0000 ug | ORAL_CAPSULE | Freq: Every day | RESPIRATORY_TRACT | Status: DC

## 2011-12-12 NOTE — Progress Notes (Signed)
Subjective:    Patient ID: [name] [name], male    DOB: May 24, 1928, 78 y.o.   MRN: [id number]  HPI 78 yo former smoker with GOLD 4 COPD.  12/12/2011 Post Hospital follow up  Patient presents for a post hospital followup. Patient was admitted third through 12/01/2011 for acute on chronic respiratory failure secondary to severe COPD, exacerbation, complicated by underlying chronic pulmonary embolism and [place] a aortic dissection Patient was evaluated by thoracic surgeon, Dr. Zenaida Niece tried. He reviewed the CT films and felt to reported a sitting aortic dissection could be possible artifact versus a small localized penetrating ulcer. He'll was felt given the patient's age and severe COPD that he should be treated conservatively. Patient's INR was subtherapeutic at 1.3, and so he was continued on anticoagulation. He underwent a right heart catheter that revealed minimally elevated PA pressures, unchanged from previous right heart catheter. Patient was treated with IV antibiotics, prednisone, and discharged on a seven-day course of doxycycline Patient was transitioned from heparin over to Coumadin prior to discharge with INR 2.1  Since discharge. Patient is feeling better decreased  sob and breathing is better. c/o a little sob with exertion and a little wheezing. Denies chest pain, chest tightness,and cough.  ONO last month showed desats however he has not been wearing . Misunderstood directions.  Stopped spiriva because he thought it was causing Pneumonia  Taking Albuterol every 4 hours, waking up in middle of night to take -thought it was a scheduled med instead of As needed   Doing well on coumadin , no hemoptysis . Followed at coumadin clinic.   Review of Systems Constitutional:   No  weight loss, night sweats,  Fevers, chills, + fatigue, or  lassitude.  HEENT:   No headaches,  Difficulty swallowing,  Tooth/dental problems, or  Sore throat,                No sneezing, itching, ear ache, nasal  congestion, post nasal drip,   CV:  No chest pain,  Orthopnea, PND, swelling in lower extremities, anasarca, dizziness, palpitations, syncope.   GI  No heartburn, indigestion, abdominal pain, nausea, vomiting, diarrhea, change in bowel habits, loss of appetite, bloody stools.   Resp:   No coughing up of blood.    No chest wall deformity  Skin: no rash or lesions.  GU: no dysuria, change in color of urine, no urgency or frequency.  No flank pain, no hematuria   MS:  No joint pain or swelling.  No decreased range of motion.  No back pain.  Psych:  No change in mood or affect. No depression or anxiety.  No memory loss.         Objective:   Physical Exam GEN: A/Ox3; pleasant , NAD,elderly , thin/tall   HEENT:  [place]/AT,  EACs-clear, TMs-wnl, NOSE-clear, THROAT-clear, no lesions, no postnasal drip or exudate noted.   NECK:  Supple w/ fair ROM; no JVD; normal carotid impulses w/o bruits; no thyromegaly or nodules palpated; no lymphadenopathy.  RESP  Coarse BS w/o, wheezes/ rales/ or rhonchi.no accessory muscle use, no dullness to percussion  CARD:  RRR, no m/r/g  , tr peripheral edema, pulses intact, no cyanosis or clubbing.  GI:   Soft & nt; nml bowel sounds; no organomegaly or masses detected.  Musco: Warm bil, no deformities or joint swelling noted.   Neuro: alert, no focal deficits noted.    Skin: Warm, no lesions or rashes         Assessment &  Plan:

## 2011-12-12 NOTE — Patient Instructions (Addendum)
Wear Oxygen 1 l/m at bedtime .  Restart Spiriva 1 puff daily  Add Budesonide (pulmicort) Neb Twice daily   Change Albuterol Neb Twice daily   If you need extra albuterol neb for wheezing, increased shortness of breath, can take every 4 hr as needed -AS NEEDED  follow up [name]  In 4 weeks and As needed

## 2011-12-12 NOTE — Assessment & Plan Note (Signed)
Restart nocturnal O2

## 2011-12-12 NOTE — Assessment & Plan Note (Signed)
Cont on coumadin as directed  follow up with labs as planned

## 2011-12-12 NOTE — Addendum Note (Signed)
Addended by: Boone Master E on: 12/12/2011 11:35 AM   Modules accepted: Orders

## 2011-12-12 NOTE — Assessment & Plan Note (Signed)
Recent flare now resolved  Restart spiriva  Add budesonide Twice daily  Neb  follow up [name]  In 4 weeks

## 2011-12-14 NOTE — Progress Notes (Signed)
Reviewed and agree with assessment/plan. 

## 2011-12-19 ENCOUNTER — Ambulatory Visit (INDEPENDENT_AMBULATORY_CARE_PROVIDER_SITE_OTHER): Payer: Medicare Other | Admitting: Physician Assistant

## 2011-12-19 ENCOUNTER — Encounter: Payer: Self-pay | Admitting: Physician Assistant

## 2011-12-19 VITALS — BP 118/56 | HR 70 | Ht 71.5 in | Wt 163.0 lb

## 2011-12-19 DIAGNOSIS — I429 Cardiomyopathy, unspecified: Secondary | ICD-10-CM

## 2011-12-19 DIAGNOSIS — I7101 Dissection of thoracic aorta: Secondary | ICD-10-CM

## 2011-12-19 DIAGNOSIS — Z86718 Personal history of other venous thrombosis and embolism: Secondary | ICD-10-CM

## 2011-12-19 DIAGNOSIS — R0602 Shortness of breath: Secondary | ICD-10-CM

## 2011-12-19 DIAGNOSIS — J449 Chronic obstructive pulmonary disease, unspecified: Secondary | ICD-10-CM

## 2011-12-19 NOTE — Progress Notes (Signed)
[address] [place], [place]  [address] Phone: [phone] Fax:  [phone]  Date:  12/19/2011   Name:  [name] [name]   DOB:  1927-11-23   MRN:  [id number]  PCP:  Kirstie Peri, MD  Primary Cardiologist:  Dr. Lewayne Bunting  Primary Electrophysiologist:  None    History of Present Illness: [name] [name] is a 78 y.o. male who returns for post hospital follow up.  He is followed in our [place] office.  He has been followed quite closely by Gene Serpe, PA-C recently.  He has a history of COPD, pulmonary embolism, nonischemic cardiomyopathy, complete heart block, status post pacemaker, status post ICD upgrade in 2008, HL, HTN.  LHC 10/04/10: Normal coronary arteries, EF 50-55%.  Last echo 10/2011: Mild to moderate concentric LVH, EF 45-50%, basal inferolateral, basal inferior and mid inferolateral HK, mild MAC, mild MR, mild to moderate AI, mild aortic stenosis, mean gradient 10, mild TR, moderate pulmonary hypertension.  Last seen by Mr. Serpe 11/20/11.  He complained of significant dyspnea with exertion.  Initially, the plan was to proceed with repeat right and left heart catheterization.  However, after further review, Dr. Andee Lineman recommended proceeding with a dobutamine stress Myoview.  I am not aware that that study was performed as of yet.  He was admitted 7/3-7/8.  He was transferred from [facility].  He initially presented with shortness of breath.  CT scan of the chest apparently showed a small pulmonary embolism and focal aortic dissection.  CT scan after presentation to [facility] was reviewed with radiology.  Both findings appeared to be old as of May 2013.  Cardiothoracic surgery (Dr. Donata Clay) was asked to see the patient.  The ascending aortic dissection was felt to possibly be artifact versus small localized penetrating ulcer.  Given his advanced age and severe COPD, conservative therapy was felt to be prudent.  Pulmonology also saw the patient.  They  felt his dyspnea was related to acute on chronic COPD exacerbation and treated him with steroids, bronchodilators and antibiotics.  Right heart catheterization was performed 11/28/11 and demonstrated minimally elevated PA pressures, no change from previous.  Dyspnea was all felt to be related to COPD and no further cardiac workup was recommended.  Pulmonology felt his pulmo HTN was likely related to COPD.  INR was sub-therapeutic and he remained in the hospital until this became therapeutic while on IV heparin overlap.    He has already followed up with pulmonology.  He has a lot of questions about his pulmonary medications.  I have asked him to check with Dr. Evlyn Courier office For specific instructions.  He also notes frequent urination.  He denies fever, back pain, dysuria.  He sees his PCP today for this problem.  His breathing is improved.  He denies chest pain.  He denies orthopnea, PND or edema.  His carvedilol was adjusted down to 12.5 mg twice a day in the hospital.  He also has questions about this.   Past Medical History  Diagnosis Date  . COPD (chronic obstructive pulmonary disease)     GOLD stage IV COPD on home oxygen;   . Pulmonary embolism     Small LLL sub-segmental PE seen on CT 11/2011, unchanged from CT in May 2013; Chronic Coumadin Therapy  . Nonischemic cardiomyopathy     Mild-EF 45-50%  . CHB (complete heart block)     S/P PTVP of CRT device and ICD upgrade in 2008  . Tobacco  abuse      Quit in the 1990s  . Hypercholesterolemia   . Peripheral neuropathy   . Hypertension   . Arthritis   . Allergic rhinitis   . Prostate cancer     s/p Prostatectomy 2008   . Pulmonary HTN     RHC 11/28/11 revealed minimally elevated PA pressures, nl wedge/cardiac output  . Aortic dissection     Localized ascending aortic dissection seen on CT 11/2011, unchanged from CT in May 2013, ? artifact    Current Outpatient Prescriptions  Medication Sig Dispense Refill  . albuterol (PROAIR HFA) 108 (90  BASE) MCG/ACT inhaler Inhale 2 puffs into the lungs every 6 (six) hours as needed for wheezing or shortness of breath.  1 Inhaler  5  . albuterol (PROVENTIL) (2.5 MG/3ML) 0.083% nebulizer solution Take 3 mLs (2.5 mg total) by nebulization every 6 (six) hours as needed for wheezing or shortness of breath.  75 mL  6  . budesonide (PULMICORT) 0.25 MG/2ML nebulizer solution Take 2 mLs (0.25 mg total) by nebulization 2 (two) times daily.  120 mL  12  . carvedilol (COREG) 12.5 MG tablet Take 1 tablet (12.5 mg total) by mouth 2 (two) times daily with a meal. Take 1 tablet by mouth twice per day.  60 tablet  3  . flunisolide (NASALIDE) 0.025 % SOLN Place 2 sprays into the nose as needed.       . furosemide (LASIX) 40 MG tablet Take 20 mg by mouth daily as needed. For excess fluid retention      . guaiFENesin (MUCINEX) 600 MG 12 hr tablet Take 2 tablets (1,200 mg total) by mouth 2 (two) times daily.  36 tablet  0  . Multiple Vitamin (MULTIVITAMIN WITH MINERALS) TABS Take 1 tablet by mouth daily.      Marland Kitchen tiotropium (SPIRIVA) 18 MCG inhalation capsule Place 1 capsule (18 mcg total) into inhaler and inhale daily.  30 capsule  6  . warfarin (COUMADIN) 5 MG tablet Take 7.5 mg tonight (12/01/11), 10mg  tomorrow (12/02/11) and 7.5mg  (12/03/11). Please have your INR checked by Wednesday the 10th for further dosing recommendations.        Allergies: Allergies  Allergen Reactions  . Lisinopril Swelling    Angioedema  . Aspirin Nausea And Vomiting    History  Substance Use Topics  . Smoking status: Former Smoker -- 0.5 packs/day for 52 years    Types: Cigarettes    Quit date: 05/26/1993  . Smokeless tobacco: Former Neurosurgeon    Types: Chew    Quit date: 05/26/1990   Comment: chewed tobacco 40 years less than a pack/day  . Alcohol Use: No     denies alcohol use      PHYSICAL EXAM: VS:  BP 118/56  Pulse 70  Ht 5' 11.5" (1.816 m)  Wt 163 lb (73.936 kg)  BMI 22.42 kg/m2 Well nourished, well developed, in no  acute distress HEENT: normal Neck: no JVD Cardiac:  normal S1, S2; RRR; 2/6 systolic murmur along LSB Lungs:  Decreased breath sounds bilaterally, no wheezing, rhonchi or rales Abd: soft, nontender, no hepatomegaly Ext: no edema; right groin without hematoma or bruit  Skin: warm and dry Neuro:  CNs 2-12 intact, no focal abnormalities noted  EKG:  AV Paced, heart rate 70      ASSESSMENT AND PLAN:  1.  Dyspnea Primarily related to COPD.  2.  COPD Follow up with pulmonary.  3.  Nonischemic cardiomyopathy Continue current Rx. Follow up  in [place] in 2-3 mos with Dr. Andee Lineman or Mr. Shara Blazing.  4.  Frequent urination He sees his PCP today for this.  5.  Pulmonary emboli Coumadin managed by PCP.  6.  Aortic dissection Conservative management planned.  7.  Hypertension Controlled.  Continue current therapy.   Signed, Tereso Newcomer, PA-C  11:53 AM 12/19/2011

## 2011-12-19 NOTE — Patient Instructions (Addendum)
Your physician recommends that you schedule a follow-up appointment in: 2-3 MONTHS WITH DR. Andee Lineman OR GENE SERPE IN THE [place] OFFICE PER SCOTT WEAVER, PAC  NO CHANGES WERE MADE TODAY

## 2012-01-06 ENCOUNTER — Ambulatory Visit: Payer: Medicare Other | Admitting: Cardiology

## 2012-01-15 ENCOUNTER — Ambulatory Visit (INDEPENDENT_AMBULATORY_CARE_PROVIDER_SITE_OTHER): Payer: Medicare Other | Admitting: Pulmonary Disease

## 2012-01-15 ENCOUNTER — Encounter: Payer: Self-pay | Admitting: Pulmonary Disease

## 2012-01-15 VITALS — BP 118/68 | HR 70 | Temp 98.3°F | Ht 71.5 in | Wt 163.4 lb

## 2012-01-15 DIAGNOSIS — G4734 Idiopathic sleep related nonobstructive alveolar hypoventilation: Secondary | ICD-10-CM

## 2012-01-15 DIAGNOSIS — J449 Chronic obstructive pulmonary disease, unspecified: Secondary | ICD-10-CM

## 2012-01-15 DIAGNOSIS — R0902 Hypoxemia: Secondary | ICD-10-CM

## 2012-01-15 NOTE — Assessment & Plan Note (Signed)
He is to continue nocturnal oxygen therapy.

## 2012-01-15 NOTE — Progress Notes (Signed)
Chief Complaint  Patient presents with  . Follow-up    breathing is improving. has a cough in the mornings. ocasioanl wheezing, chest tx. hoarseness that comes and goes. uses 2 liters oxygen at bedtime    History of Present Illness: [name] [name] is a 78 y.o. male former smoker with GOLD 4 COPD with emphysema, and nocturnal hypoxia.  He has been doing better.  He uses spiriva once daily, and pulmicort/albuterol twice daily.  He is using oxygen at night.  He can keep up with his activity as long as he goes slow.  He is not having much cough, wheeze, or chest congestion.  He was found to have Type A aortic dissection and small PE in July.  He was seen by [name] [name].  Past Medical History  Diagnosis Date  . COPD (chronic obstructive pulmonary disease)     GOLD stage IV COPD on home oxygen;   . Pulmonary embolism     Small LLL sub-segmental PE seen on CT 11/2011, unchanged from CT in May 2013; Chronic Coumadin Therapy  . Nonischemic cardiomyopathy     Mild-EF 45-50%  . CHB (complete heart block)     S/P PTVP of CRT device and ICD upgrade in 2008  . Tobacco abuse      Quit in the 1990s  . Hypercholesterolemia   . Peripheral neuropathy   . Hypertension   . Arthritis   . Allergic rhinitis   . Prostate cancer     s/p Prostatectomy 2008   . Pulmonary HTN     RHC 11/28/11 revealed minimally elevated PA pressures, nl wedge/cardiac output  . Aortic dissection     Localized ascending aortic dissection seen on CT 11/2011, unchanged from CT in May 2013, ? artifact    Past Surgical History  Procedure Date  . Prostatectomy   . Cardiac defibrillator placement 2008    St. Jude Upgraded (2008)    Current Outpatient Prescriptions on File Prior to Visit  Medication Sig Dispense Refill  . albuterol (PROAIR HFA) 108 (90 BASE) MCG/ACT inhaler Inhale 2 puffs into the lungs every 6 (six) hours as needed for wheezing or shortness of breath.  1 Inhaler  5  . budesonide (PULMICORT) 0.25 MG/2ML  nebulizer solution Take 2 mLs (0.25 mg total) by nebulization 2 (two) times daily.  120 mL  12  . carvedilol (COREG) 12.5 MG tablet Take 1 tablet (12.5 mg total) by mouth 2 (two) times daily with a meal. Take 1 tablet by mouth twice per day.  60 tablet  3  . flunisolide (NASALIDE) 0.025 % SOLN Place 2 sprays into the nose as needed.       . furosemide (LASIX) 40 MG tablet Take 20 mg by mouth daily as needed. For excess fluid retention      . guaiFENesin (MUCINEX) 600 MG 12 hr tablet Take 2 tablets (1,200 mg total) by mouth 2 (two) times daily.  36 tablet  0  . Multiple Vitamin (MULTIVITAMIN WITH MINERALS) TABS Take 1 tablet by mouth daily.      Marland Kitchen tiotropium (SPIRIVA) 18 MCG inhalation capsule Place 1 capsule (18 mcg total) into inhaler and inhale daily.  30 capsule  6  . warfarin (COUMADIN) 5 MG tablet Take 7.5 mg tonight (12/01/11), 10mg  tomorrow (12/02/11) and 7.5mg  (12/03/11). Please have your INR checked by Wednesday the 10th for further dosing recommendations.      Marland Kitchen DISCONTD: albuterol (PROVENTIL) (2.5 MG/3ML) 0.083% nebulizer solution Take 3 mLs (2.5 mg  total) by nebulization every 6 (six) hours as needed for wheezing or shortness of breath.  75 mL  6    Allergies  Allergen Reactions  . Lisinopril Swelling    Angioedema  . Aspirin Nausea And Vomiting    Physical Exam:  Blood pressure 118/68, pulse 70, temperature 98.3 F (36.8 C), temperature source Oral, height 5' 11.5" (1.816 m), weight 163 lb 6.4 oz (74.118 kg), SpO2 98.00%.   Body mass index is 22.47 kg/(m^2). Wt Readings from Last 3 Encounters:  01/15/12 163 lb 6.4 oz (74.118 kg)  12/19/11 163 lb (73.936 kg)  12/12/11 164 lb 3.2 oz (74.481 kg)    General - Thin  HEENT - PERRLA, EOMI, no sinus tenderness, no oral exudate, no LAN, no thyromegaly  Cardiac - s1s2 regular, no murmur  Chest - prolonged exhalation, no wheeze/rales/dullness  Abdomen - soft, non-tender, no organomegaly  Extremities - no e/c/c  Neurologic - normal  strength, CN intact  Skin - no rashes  Psychiatric - normal mood, behavior   Assessment/Plan:  Outpatient Encounter Prescriptions as of 01/15/2012  Medication Sig Dispense Refill  . albuterol (PROAIR HFA) 108 (90 BASE) MCG/ACT inhaler Inhale 2 puffs into the lungs every 6 (six) hours as needed for wheezing or shortness of breath.  1 Inhaler  5  . albuterol (PROVENTIL) (2.5 MG/3ML) 0.083% nebulizer solution Take 2.5 mg by nebulization 2 (two) times daily.      . budesonide (PULMICORT) 0.25 MG/2ML nebulizer solution Take 2 mLs (0.25 mg total) by nebulization 2 (two) times daily.  120 mL  12  . carvedilol (COREG) 12.5 MG tablet Take 1 tablet (12.5 mg total) by mouth 2 (two) times daily with a meal. Take 1 tablet by mouth twice per day.  60 tablet  3  . flunisolide (NASALIDE) 0.025 % SOLN Place 2 sprays into the nose as needed.       . furosemide (LASIX) 40 MG tablet Take 20 mg by mouth daily as needed. For excess fluid retention      . guaiFENesin (MUCINEX) 600 MG 12 hr tablet Take 2 tablets (1,200 mg total) by mouth 2 (two) times daily.  36 tablet  0  . Multiple Vitamin (MULTIVITAMIN WITH MINERALS) TABS Take 1 tablet by mouth daily.      Marland Kitchen tiotropium (SPIRIVA) 18 MCG inhalation capsule Place 1 capsule (18 mcg total) into inhaler and inhale daily.  30 capsule  6  . warfarin (COUMADIN) 5 MG tablet Take 7.5 mg tonight (12/01/11), 10mg  tomorrow (12/02/11) and 7.5mg  (12/03/11). Please have your INR checked by Wednesday the 10th for further dosing recommendations.      Marland Kitchen DISCONTD: albuterol (PROVENTIL) (2.5 MG/3ML) 0.083% nebulizer solution Take 3 mLs (2.5 mg total) by nebulization every 6 (six) hours as needed for wheezing or shortness of breath.  75 mL  6    [name] [name] Pager:  [phone] 01/15/2012, 9:12 AM

## 2012-01-15 NOTE — Patient Instructions (Signed)
Follow up in 6 months 

## 2012-01-15 NOTE — Assessment & Plan Note (Signed)
Stable on current regimen of spiriva, pulmicort, and albuterol.

## 2012-03-11 ENCOUNTER — Ambulatory Visit: Payer: Medicare Other | Admitting: Cardiology

## 2012-03-31 ENCOUNTER — Encounter: Payer: Self-pay | Admitting: Cardiology

## 2012-03-31 ENCOUNTER — Ambulatory Visit (INDEPENDENT_AMBULATORY_CARE_PROVIDER_SITE_OTHER): Payer: Medicare Other | Admitting: Cardiology

## 2012-03-31 VITALS — BP 138/79 | HR 85 | Ht 71.5 in | Wt 165.0 lb

## 2012-03-31 DIAGNOSIS — I7101 Dissection of thoracic aorta: Secondary | ICD-10-CM

## 2012-03-31 DIAGNOSIS — I359 Nonrheumatic aortic valve disorder, unspecified: Secondary | ICD-10-CM

## 2012-03-31 DIAGNOSIS — Z950 Presence of cardiac pacemaker: Secondary | ICD-10-CM

## 2012-03-31 DIAGNOSIS — J439 Emphysema, unspecified: Secondary | ICD-10-CM

## 2012-03-31 DIAGNOSIS — I350 Nonrheumatic aortic (valve) stenosis: Secondary | ICD-10-CM | POA: Insufficient documentation

## 2012-03-31 DIAGNOSIS — Z86718 Personal history of other venous thrombosis and embolism: Secondary | ICD-10-CM

## 2012-03-31 DIAGNOSIS — J438 Other emphysema: Secondary | ICD-10-CM

## 2012-03-31 DIAGNOSIS — I429 Cardiomyopathy, unspecified: Secondary | ICD-10-CM

## 2012-03-31 NOTE — Assessment & Plan Note (Signed)
Followed by pulmonary 

## 2012-03-31 NOTE — Assessment & Plan Note (Signed)
Continue Coumadin. 

## 2012-03-31 NOTE — Patient Instructions (Addendum)

## 2012-03-31 NOTE — Assessment & Plan Note (Signed)
Management per electrophysiology. 

## 2012-03-31 NOTE — Assessment & Plan Note (Signed)
LV function improved on most recent echocardiogram. 

## 2012-03-31 NOTE — Assessment & Plan Note (Signed)
Focal penetrating ulcer on CT scan that is being treated medically because of patient's COPD.

## 2012-03-31 NOTE — Progress Notes (Signed)
HPI: Pleasant male for fu of cardiomyopathy. He has a history of COPD, pulmonary embolism, nonischemic cardiomyopathy, complete heart block, status post pacemaker, status post ICD upgrade in 2008, HL, HTN. LHC 10/04/10: Normal coronary arteries, EF 50-55%. Last echo 10/2011: Mild to moderate concentric LVH, EF 45-50%, basal inferolateral, basal inferior and mid inferolateral HK, mild MAC, mild MR, mild to moderate AI, mild aortic stenosis, mean gradient 10, mild TR, moderate pulmonary hypertension.  CT scan in July of 2013  showed a small pulmonary embolism and focal aortic dissection. CT scan after presentation to [facility] was reviewed with radiology. Both findings appeared to be old as of May 2013. Cardiothoracic surgery (Dr. Donata Clay) was asked to see the patient. The ascending aortic dissection was felt to possibly be artifact versus small localized penetrating ulcer. Given his advanced age and severe COPD, conservative therapy was felt to be prudent. Right heart catheterization was performed 11/28/11 and demonstrated minimally elevated PA pressures, no change from previous. Dyspnea was all felt to be related to COPD and no further cardiac workup was recommended. Pulmonology felt his pulmo HTN was likely related to COPD. Patient last seen in July of 2013. Since then, he does have some dyspnea on exertion but no orthopnea, PND, pedal edema, palpitations, syncope or chest pain.    Current Outpatient Prescriptions  Medication Sig Dispense Refill  . albuterol (PROAIR HFA) 108 (90 BASE) MCG/ACT inhaler Inhale 2 puffs into the lungs every 6 (six) hours as needed for wheezing or shortness of breath.  1 Inhaler  5  . albuterol (PROVENTIL) (2.5 MG/3ML) 0.083% nebulizer solution Take 2.5 mg by nebulization 2 (two) times daily.      . budesonide (PULMICORT) 0.25 MG/2ML nebulizer solution Take 2 mLs (0.25 mg total) by nebulization 2 (two) times daily.  120 mL  12  . carvedilol (COREG) 12.5 MG tablet  Take 1 tablet (12.5 mg total) by mouth 2 (two) times daily with a meal. Take 1 tablet by mouth twice per day.  60 tablet  3  . flunisolide (NASALIDE) 0.025 % SOLN Place 2 sprays into the nose as needed.       . furosemide (LASIX) 40 MG tablet Take 20 mg by mouth daily as needed. For excess fluid retention      . guaiFENesin (MUCINEX) 600 MG 12 hr tablet Take 1,200 mg by mouth 2 (two) times daily as needed.      . Multiple Vitamin (MULTIVITAMIN WITH MINERALS) TABS Take 1 tablet by mouth daily.      Marland Kitchen tiotropium (SPIRIVA) 18 MCG inhalation capsule Place 1 capsule (18 mcg total) into inhaler and inhale daily.  30 capsule  6  . warfarin (COUMADIN) 5 MG tablet Take 7.5 mg tonight (12/01/11), 10mg  tomorrow (12/02/11) and 7.5mg  (12/03/11). Please have your INR checked by Wednesday the 10th for further dosing recommendations.         Past Medical History  Diagnosis Date  . COPD (chronic obstructive pulmonary disease)     GOLD stage IV COPD on home oxygen;   . Pulmonary embolism     Small LLL sub-segmental PE seen on CT 11/2011, unchanged from CT in May 2013; Chronic Coumadin Therapy  . Nonischemic cardiomyopathy     Mild-EF 45-50%  . CHB (complete heart block)     S/P PTVP of CRT device and ICD upgrade in 2008  . Tobacco abuse      Quit in the 1990s  . Hypercholesterolemia   . Peripheral neuropathy   .  Hypertension   . Arthritis   . Allergic rhinitis   . Prostate cancer     s/p Prostatectomy 2008   . Pulmonary HTN     RHC 11/28/11 revealed minimally elevated PA pressures, nl wedge/cardiac output  . Aortic dissection     Localized ascending aortic dissection seen on CT 11/2011, unchanged from CT in May 2013, ? artifact    Past Surgical History  Procedure Date  . Prostatectomy   . Cardiac defibrillator placement 2008    St. Jude Upgraded (2008)    History   Social History  . Marital Status: Married    Spouse Name: N/A    Number of Children: N/A  . Years of Education: N/A   Occupational  History  . 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 Smoking status: Former Smoker -- 0.5 packs/day for 52 years    Types: Cigarettes    Quit date: 05/26/1993  . Smokeless tobacco: Former Neurosurgeon    Types: Chew    Quit date: 05/26/1990     Comment: chewed tobacco 40 years less than a pack/day  . Alcohol Use: No     Comment: denies alcohol use  . Drug Use: No  . Sexually Active: Not on file   Other Topics Concern  . Not on file   Social History Narrative   Retired Surveyor, minerals.  Lives with wife of 61 years.    ROS: no fevers or chills, productive cough, hemoptysis, dysphasia, odynophagia, melena, hematochezia, dysuria, hematuria, rash, seizure activity, orthopnea, PND, pedal edema, claudication. Remaining systems are negative.  Physical Exam: Well-developed well-nourished in no acute distress.  Skin is warm and dry.  HEENT is normal.  Neck is supple.  Chest is clear to auscultation with normal expansion.  Cardiovascular exam is regular rate and rhythm. 2/6 systolic murmur left sternal border. Abdominal exam nontender or distended. No masses palpated. Extremities show no edema. neuro grossly intact

## 2012-03-31 NOTE — Assessment & Plan Note (Signed)
Plan repeat echocardiogram in one year. 

## 2012-05-18 ENCOUNTER — Other Ambulatory Visit: Payer: Self-pay

## 2012-05-18 MED ORDER — CARVEDILOL 12.5 MG PO TABS: 12.5000 mg | ORAL_TABLET | Freq: Two times a day (BID) | ORAL | Status: DC

## 2012-07-05 ENCOUNTER — Telehealth: Payer: Self-pay | Admitting: Cardiology

## 2012-07-05 ENCOUNTER — Encounter: Payer: Medicare Other | Admitting: Internal Medicine

## 2012-07-05 NOTE — Telephone Encounter (Signed)
Dr [name] advised patient to call us about his cough and that they think heart is causing it

## 2012-07-06 NOTE — Telephone Encounter (Signed)
Left message to return call 

## 2012-07-06 NOTE — Telephone Encounter (Addendum)
Patient states he was in PMD [name]) office for INR check the other day & states that he has been having hacking cough.  Dr. [name] advised him to call us.  Discussed with patient & he states that he does have COPD also.  Has OV scheduled for 2/19 with [name] ([facility]).  Advised him to call back for OV if Pulmonology MD feels that cardiac eval is warranted.  Patient verbalized understanding & will call back if needs appointment.    Also, states he missed his OV with Dr. Johney Frame yesterday.  Rescheduled for device clinic with Belenda Cruise on Friday, 2/14.

## 2012-07-12 ENCOUNTER — Telehealth: Payer: Self-pay | Admitting: Cardiology

## 2012-07-12 NOTE — Telephone Encounter (Signed)
error 

## 2012-07-14 ENCOUNTER — Ambulatory Visit (INDEPENDENT_AMBULATORY_CARE_PROVIDER_SITE_OTHER): Payer: Medicare Other | Admitting: Pulmonary Disease

## 2012-07-14 ENCOUNTER — Encounter: Payer: Self-pay | Admitting: Pulmonary Disease

## 2012-07-14 ENCOUNTER — Ambulatory Visit (INDEPENDENT_AMBULATORY_CARE_PROVIDER_SITE_OTHER)
Admission: RE | Admit: 2012-07-14 | Discharge: 2012-07-14 | Disposition: A | Discharge status: Home / Self Care | Payer: Medicare Other | Source: Ambulatory Visit | Attending: Pulmonary Disease | Admitting: Pulmonary Disease

## 2012-07-14 VITALS — BP 112/70 | HR 85 | Temp 98.0°F | Ht 72.0 in | Wt 163.8 lb

## 2012-07-14 DIAGNOSIS — J438 Other emphysema: Secondary | ICD-10-CM

## 2012-07-14 DIAGNOSIS — J9611 Chronic respiratory failure with hypoxia: Secondary | ICD-10-CM

## 2012-07-14 DIAGNOSIS — J9610 Chronic respiratory failure, unspecified whether with hypoxia or hypercapnia: Secondary | ICD-10-CM

## 2012-07-14 DIAGNOSIS — R0902 Hypoxemia: Secondary | ICD-10-CM

## 2012-07-14 DIAGNOSIS — J439 Emphysema, unspecified: Secondary | ICD-10-CM

## 2012-07-14 NOTE — Progress Notes (Signed)
Chief Complaint  Patient presents with  . Follow-up    c/o increase SOB w/ activity, dry hacking cough, wheezing but no chest tx. he is using his oxygen prn during the day and everynight. He is due for recertification for his O2.    History of Present Illness: [name] [name] is a 79 y.o. male former smoker with GOLD 4 COPD/emphysema, and hypoxemia.  He has occasional cough, more in the morning.  He has trouble bringing up sputum.  He gets occasional wheezing.  He feels better when he uses albuterol.  He uses mucinex, and this helps some.  He has a flutter valve, but has not been using this.  He uses his oxygen at night and as needed during the day.  He denies hemoptysis, or leg swelling.  TESTS: Spirometry 04/09/11 >> FEV1 0.69 (24%), FEV1% 37 PFT 05/09/11 >> FEV1 1.16(42%), FEV1% 45, TLC 5.22(81%), DLCO 40%, +BD RA ONO 11/13/11 >> Test time 8 hrs 41 min.  Average SpO2 92.6%, low SpO2 85%.  Spent 13 min with SpO2 < 88%. Start 1 liter oxygen qhs 11/22/11  Past Medical History  Diagnosis Date  . COPD (chronic obstructive pulmonary disease)     GOLD stage IV COPD on home oxygen;   . Pulmonary embolism     Small LLL sub-segmental PE seen on CT 11/2011, unchanged from CT in May 2013; Chronic Coumadin Therapy  . Nonischemic cardiomyopathy     Mild-EF 45-50%  . CHB (complete heart block)     S/P PTVP of CRT device and ICD upgrade in 2008  . Tobacco abuse      Quit in the 1990s  . Hypercholesterolemia   . Peripheral neuropathy   . Hypertension   . Arthritis   . Allergic rhinitis   . Prostate cancer     s/p Prostatectomy 2008   . Pulmonary HTN     RHC 11/28/11 revealed minimally elevated PA pressures, nl wedge/cardiac output  . Aortic dissection     Localized ascending aortic dissection seen on CT 11/2011, unchanged from CT in May 2013, ? artifact    Past Surgical History  Procedure Laterality Date  . Prostatectomy    . Cardiac defibrillator placement  2008    St. Jude Upgraded (2008)     Current Outpatient Prescriptions on File Prior to Visit  Medication Sig Dispense Refill  . albuterol (PROAIR HFA) 108 (90 BASE) MCG/ACT inhaler Inhale 2 puffs into the lungs every 6 (six) hours as needed for wheezing or shortness of breath.  1 Inhaler  5  . albuterol (PROVENTIL) (2.5 MG/3ML) 0.083% nebulizer solution Take 2.5 mg by nebulization 2 (two) times daily.      . budesonide (PULMICORT) 0.25 MG/2ML nebulizer solution Take 2 mLs (0.25 mg total) by nebulization 2 (two) times daily.  120 mL  12  . carvedilol (COREG) 12.5 MG tablet Take 1 tablet (12.5 mg total) by mouth 2 (two) times daily with a meal. Take 1 tablet by mouth twice per day.  60 tablet  3  . flunisolide (NASALIDE) 0.025 % SOLN Place 2 sprays into the nose as needed.       . furosemide (LASIX) 40 MG tablet Take 20 mg by mouth daily as needed. For excess fluid retention      . guaiFENesin (MUCINEX) 600 MG 12 hr tablet Take 1,200 mg by mouth 2 (two) times daily as needed.      . Multiple Vitamin (MULTIVITAMIN WITH MINERALS) TABS Take 1 tablet by mouth  daily.      . tiotropium (SPIRIVA) 18 MCG inhalation capsule Place 1 capsule (18 mcg total) into inhaler and inhale daily.  30 capsule  6  . warfarin (COUMADIN) 5 MG tablet Take 7.5 mg tonight (12/01/11), 10mg  tomorrow (12/02/11) and 7.5mg  (12/03/11). Please have your INR checked by Wednesday the 10th for further dosing recommendations.       No current facility-administered medications on file prior to visit.    Allergies  Allergen Reactions  . Lisinopril Swelling    Angioedema  . Aspirin Nausea And Vomiting    Physical Exam:  Filed Vitals:   07/14/12 1346  BP: 112/70  Pulse: 85  Temp: 98 F (36.7 C)  TempSrc: Oral  Height: 6' (1.829 m)  Weight: 163 lb 12.8 oz (74.299 kg)  SpO2: 95%    Wt Readings from Last 3 Encounters:  07/14/12 163 lb 12.8 oz (74.299 kg)  03/31/12 165 lb (74.844 kg)  01/15/12 163 lb 6.4 oz (74.118 kg)    Body mass index is 22.21  kg/(m^2).   General - Thin  HEENT - PERRLA, EOMI, no sinus tenderness, no oral exudate, no LAN, no thyromegaly  Cardiac - s1s2 regular, no murmur  Chest - prolonged exhalation, no wheeze/rales/dullness  Abdomen - soft, non-tender, no organomegaly  Extremities - no e/c/c  Neurologic - normal strength, CN intact  Skin - no rashes  Psychiatric - normal mood, behavior   Assessment/Plan:  Coralyn Helling, MD [facility] Pulmonary/Critical Care 07/14/2012, 2:13 PM Pager:  [phone] After 3pm call: [phone]

## 2012-07-14 NOTE — Patient Instructions (Signed)
Try using your flutter valve more Use albuterol nebulizer when you have cough, congestion, or wheezing Chest xray today >> will call with results Follow up in 6 months

## 2012-07-14 NOTE — Assessment & Plan Note (Signed)
He is to continue supplemental oxygen.               

## 2012-07-14 NOTE — Assessment & Plan Note (Signed)
Advised him to use his flutter valve, and try using albuterol more.  He can use mucinex as needed.  He is to continue spiriva and pulmicort.  Will repeat chest xray today, and call with results.

## 2012-07-15 ENCOUNTER — Telehealth: Payer: Self-pay | Admitting: Pulmonary Disease

## 2012-07-15 NOTE — Telephone Encounter (Signed)
Dg Chest 2 View  07/14/2012  *RADIOLOGY REPORT*  Clinical Data: Cough with history of COPD.  CHEST - 2 VIEW  Comparison: 11/28/2011  Findings: Hyperinflation.  Pacer / AICD device.  No lead discontinuity.  Mild pectus excavatum deformity.  Mild cardiomegaly with a tortuous descending thoracic aorta. No pleural effusion or pneumothorax.  Pulmonary artery enlargement, suggesting pulmonary arterial hypertension. No congestive failure.  Mild volume loss at the medial right lung base.  IMPRESSION: Cardiomegaly, without acute disease.  Hyperinflation/COPD. Pulmonary artery enlargement suggests pulmonary arterial hypertension.   Original Report Authenticated By: Jeronimo Greaves, M.D.     Will have my nurse inform pt that CXR shows expected changes from COPD with emphysema.  No other worrisome findings.  No change to current treatment plan.

## 2012-07-15 NOTE — Telephone Encounter (Signed)
I spoke with patient about results and he verbalized understanding and had no questions 

## 2012-07-23 ENCOUNTER — Other Ambulatory Visit: Payer: Self-pay | Admitting: Internal Medicine

## 2012-07-23 ENCOUNTER — Ambulatory Visit (INDEPENDENT_AMBULATORY_CARE_PROVIDER_SITE_OTHER): Payer: Medicare Other | Admitting: *Deleted

## 2012-07-23 DIAGNOSIS — I442 Atrioventricular block, complete: Secondary | ICD-10-CM

## 2012-07-23 LAB — PACEMAKER DEVICE OBSERVATION
ATRIAL PACING PM: 97
BAMS-0001: 170 {beats}/min
BAMS-0003: 70 {beats}/min
BATTERY VOLTAGE: 2.73 V
VENTRICULAR PACING PM: 99

## 2012-07-23 NOTE — Progress Notes (Signed)
Pacer check in clinic  

## 2012-08-09 ENCOUNTER — Encounter: Payer: Self-pay | Admitting: Internal Medicine

## 2012-08-11 ENCOUNTER — Encounter: Payer: Self-pay | Admitting: Cardiology

## 2012-08-11 DIAGNOSIS — R0989 Other specified symptoms and signs involving the circulatory and respiratory systems: Secondary | ICD-10-CM

## 2012-08-11 DIAGNOSIS — R079 Chest pain, unspecified: Secondary | ICD-10-CM

## 2012-08-11 DIAGNOSIS — R0609 Other forms of dyspnea: Secondary | ICD-10-CM

## 2012-09-03 ENCOUNTER — Encounter: Payer: Medicare Other | Admitting: Internal Medicine

## 2012-09-06 ENCOUNTER — Encounter: Payer: Self-pay | Admitting: Internal Medicine

## 2012-09-06 ENCOUNTER — Ambulatory Visit (INDEPENDENT_AMBULATORY_CARE_PROVIDER_SITE_OTHER): Payer: Medicare Other | Admitting: Physician Assistant

## 2012-09-06 ENCOUNTER — Ambulatory Visit (INDEPENDENT_AMBULATORY_CARE_PROVIDER_SITE_OTHER): Payer: Medicare Other | Admitting: Internal Medicine

## 2012-09-06 ENCOUNTER — Encounter: Payer: Self-pay | Admitting: Physician Assistant

## 2012-09-06 VITALS — BP 144/79 | HR 85 | Ht 71.0 in | Wt 169.0 lb

## 2012-09-06 DIAGNOSIS — I38 Endocarditis, valve unspecified: Secondary | ICD-10-CM

## 2012-09-06 DIAGNOSIS — I429 Cardiomyopathy, unspecified: Secondary | ICD-10-CM

## 2012-09-06 DIAGNOSIS — I2789 Other specified pulmonary heart diseases: Secondary | ICD-10-CM

## 2012-09-06 DIAGNOSIS — R0602 Shortness of breath: Secondary | ICD-10-CM

## 2012-09-06 DIAGNOSIS — I272 Pulmonary hypertension, unspecified: Secondary | ICD-10-CM

## 2012-09-06 DIAGNOSIS — Z950 Presence of cardiac pacemaker: Secondary | ICD-10-CM

## 2012-09-06 DIAGNOSIS — I442 Atrioventricular block, complete: Secondary | ICD-10-CM

## 2012-09-06 DIAGNOSIS — Z86718 Personal history of other venous thrombosis and embolism: Secondary | ICD-10-CM

## 2012-09-06 LAB — PACEMAKER DEVICE OBSERVATION
AL AMPLITUDE: 2.1 mv
AL THRESHOLD: 0.75 V
BAMS-0001: 170 {beats}/min
DEVICE MODEL PM: 1873447
LV LEAD THRESHOLD: 0.75 V
RV LEAD IMPEDENCE PM: 443 Ohm
VENTRICULAR PACING PM: 99

## 2012-09-06 MED ORDER — CARVEDILOL 12.5 MG PO TABS: 12.5000 mg | ORAL_TABLET | Freq: Two times a day (BID) | ORAL | Status: DC

## 2012-09-06 NOTE — Assessment & Plan Note (Signed)
Recent echocardiogram indicated mild aortic stenosis, moderate aortic regurgitation, and moderate aortic root dilatation (4.4 cm).

## 2012-09-06 NOTE — Progress Notes (Signed)
Primary Cardiologist: [name] Millers, MD   HPI: Post hospital followup from [name], status post presentation with atypical CP. Cardiac markers NL.   - 2-D echocardiogram, March 19: EF 55-60%, mild diastolic dysfunction; mild AS; moderate AR; moderate PHTN (RVSP 57 mmHg); moderate aortic root dilatation (4.4 cm)  Patient essentially presented with DOE in the context of known, severe COPD. There was no definite evidence of CHF (BNP NL). His chest pain was associated with coughing. Regarding medications, we substituted carvedilol with Bisoprolol, given the increased beta 1 specificity of the latter. This was based on the possibility that the carvedilol could be exacerbating his dyspnea.  Patient returns reporting development of a cough, after we started him on bisoprolol. He continues to report no symptoms suggestive of decompensated heart failure, but continues to have persistent DOE. He has not had a followup with his pulmonologist, Dr. Craige [name], in [place]. He uses supplemental O2 at night only. He has seen Dr. Sherryll [name] in followup, who manages his Coumadin.  Allergies  Allergen Reactions  . Lisinopril Swelling    Angioedema  . Aspirin Nausea And Vomiting    Current Outpatient Prescriptions  Medication Sig Dispense Refill  . albuterol (PROAIR HFA) 108 (90 BASE) MCG/ACT inhaler Inhale 2 puffs into the lungs every 6 (six) hours as needed for wheezing or shortness of breath.  1 Inhaler  5  . albuterol (PROVENTIL) (2.5 MG/3ML) 0.083% nebulizer solution Take 2.5 mg by nebulization 2 (two) times daily.      . budesonide (PULMICORT) 0.25 MG/2ML nebulizer solution Take 2 mLs (0.25 mg total) by nebulization 2 (two) times daily.  120 mL  12  . carvedilol (COREG) 12.5 MG tablet Take 1 tablet (12.5 mg total) by mouth 2 (two) times daily with a meal. Take 1 tablet by mouth twice per day.  60 tablet  6  . flunisolide (NASALIDE) 0.025 % SOLN Place 2 sprays into the nose as needed.       . furosemide (LASIX) 40 MG  tablet Take 20 mg by mouth daily as needed. For excess fluid retention      . guaiFENesin (MUCINEX) 600 MG 12 hr tablet Take 1,200 mg by mouth 2 (two) times daily as needed.      . Multiple Vitamin (MULTIVITAMIN WITH MINERALS) TABS Take 1 tablet by mouth daily.      Marland Kitchen tiotropium (SPIRIVA) 18 MCG inhalation capsule Place 1 capsule (18 mcg total) into inhaler and inhale daily.  30 capsule  6  . warfarin (COUMADIN) 5 MG tablet Take 7.5 mg tonight (12/01/11), 10mg  tomorrow (12/02/11) and 7.5mg  (12/03/11). Please have your INR checked by Wednesday the 10th for further dosing recommendations.       No current facility-administered medications for this visit.    Past Medical History  Diagnosis Date  . COPD (chronic obstructive pulmonary disease)     GOLD stage IV COPD on home oxygen;   . Pulmonary embolism     Small LLL sub-segmental PE seen on CT 11/2011, unchanged from CT in May 2013; Chronic Coumadin Therapy  . Nonischemic cardiomyopathy     Mild-EF 45-50%  . CHB (complete heart block)     S/P PPM with CRT upgrade in 2008  . Tobacco abuse      Quit in the 1990s  . Hypercholesterolemia   . Peripheral neuropathy   . Hypertension   . Arthritis   . Allergic rhinitis   . Prostate cancer     s/p Prostatectomy 2008   . Pulmonary HTN  RHC 11/28/11 revealed minimally elevated PA pressures, nl wedge/cardiac output  . Aortic dissection     Localized ascending aortic dissection seen on CT 11/2011, unchanged from CT in May 2013, ? artifact    Past Surgical History  Procedure Laterality Date  . Prostatectomy    . Pacemaker insertion  09/15/06    St. Jude Upgraded (2008)    History   Social History  . Marital Status: Married    Spouse Name: N/A    Number of Children: N/A  . Years of Education: N/A   Occupational History  . 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 Smoking status: Former Smoker -- 0.50 packs/day for 52 years    Types: Cigarettes    Quit date: 05/26/1993  .  Smokeless tobacco: Former Neurosurgeon    Types: Chew    Quit date: 05/26/1990     Comment: chewed tobacco 40 years less than a pack/day  . Alcohol Use: No     Comment: denies alcohol use  . Drug Use: No  . Sexually Active: Not on file   Other Topics Concern  . Not on file   Social History Narrative   Retired Surveyor, minerals.  Lives with wife of 61 years.    Family History  Problem Relation Age of Onset  . Hypertension Mother   . Heart failure Father   . Stroke    . Asthma      ROS: no nausea, vomiting; no fever, chills; no melena, hematochezia; no claudication  PHYSICAL EXAM: BP 144/79  Pulse 85  Ht 5\' 11"  (1.803 m)  Wt 169 lb (76.658 kg)  BMI 23.58 kg/m2 GENERAL: 79 year old male; NAD HEENT: NCAT, PERRLA, EOMI; sclera clear; no xanthelasma NECK: palpable bilateral carotid pulses, no bruits; no JVD; no TM LUNGS: CTA bilaterally CARDIAC: RRR (S1, S2); short 2/6 systolic ejection murmur at base; no rubs or gallops ABDOMEN: soft, non-tender; intact BS EXTREMETIES: no significant peripheral edema SKIN: warm/dry; no obvious rash/lesions MUSCULOSKELETAL: no joint deformity NEURO: no focal deficit; NL affect   EKG:    ASSESSMENT & PLAN:  Biventricular cardiac pacemaker  St Judes Followed by Dr. Johney Frame  Cardiomyopathy nonischemic Patient continues to experience persistent DOE, with no recent evidence of ischemia or heart failure. We felt that his symptoms were most likely related to severe COPD. He did not tolerate bisoprolol, and we will place him back on carvedilol at previous dose of 12.5 twice a day. Patient has normal LVF, as confirmed by recent echocardiogram. He had normal coronary arteries, by cardiac catheterization in 2012.   History of pulmonary embolus On chronic Coumadin anticoagulation, followed by primary M.D.  Valvular heart disease Recent echocardiogram indicated mild aortic stenosis, moderate aortic regurgitation, and moderate aortic root dilatation (4.4  cm).  Pulmonary hypertension Assessed as moderate, by recent echocardiogram (RVSP 57 mmHg)    [name] [name], PAC

## 2012-09-06 NOTE — Assessment & Plan Note (Signed)
Patient continues to experience persistent DOE, with no recent evidence of ischemia or heart failure. We felt that his symptoms were most likely related to severe COPD. He did not tolerate bisoprolol, and we will place him back on carvedilol at previous dose of 12.5 twice a day. Patient has normal LVF, as confirmed by recent echocardiogram. He had normal coronary arteries, by cardiac catheterization in 2012. 

## 2012-09-06 NOTE — Assessment & Plan Note (Signed)
Followed by Dr. Allred. 

## 2012-09-06 NOTE — Assessment & Plan Note (Signed)
Assessed as moderate, by recent echocardiogram (RVSP 57 mmHg)

## 2012-09-06 NOTE — Assessment & Plan Note (Signed)
On chronic Coumadin anticoagulation, followed by primary M.D.

## 2012-09-06 NOTE — Patient Instructions (Addendum)
Your physician recommends that you schedule a follow-up appointment in: 1 year with Dr. Johney Frame.Marland Kitchen You will receive a reminder letter in the mail in about 10 months reminding you to call and schedule your appointment. If you don't receive this letter, please contact our office. Your physician recommends that you schedule a follow-up appointment with Baxter Hire in 2 months. Your physician has recommended you make the following change in your medication: stop bisoprolol. Restart your carvedilol 12.5 mg twice daily. All other medications will remain the same.

## 2012-09-06 NOTE — Progress Notes (Signed)
PCP: Kirstie Peri, MD Primary Cardiologist:  Dr Albina Billet is a 79 y.o. male who presents today for routine electrophysiology followup.  Since last being seen in our clinic, the patient reports doing very well.  He has stable SOB. He reports cough with bisoprolol and wants to restart coreg. Today, he denies symptoms of palpitations, chest pain, lower extremity edema, dizziness, presyncope, or syncope.  The patient is otherwise without complaint today.   Past Medical History  Diagnosis Date  . COPD (chronic obstructive pulmonary disease)     GOLD stage IV COPD on home oxygen;   . Pulmonary embolism     Small LLL sub-segmental PE seen on CT 11/2011, unchanged from CT in May 2013; Chronic Coumadin Therapy  . Nonischemic cardiomyopathy     Mild-EF 45-50%  . CHB (complete heart block)     S/P PPM with CRT upgrade in 2008  . Tobacco abuse      Quit in the 1990s  . Hypercholesterolemia   . Peripheral neuropathy   . Hypertension   . Arthritis   . Allergic rhinitis   . Prostate cancer     s/p Prostatectomy 2008   . Pulmonary HTN     RHC 11/28/11 revealed minimally elevated PA pressures, nl wedge/cardiac output  . Aortic dissection     Localized ascending aortic dissection seen on CT 11/2011, unchanged from CT in May 2013, ? artifact   Past Surgical History  Procedure Laterality Date  . Prostatectomy    . Pacemaker insertion  09/15/06    St. Jude Upgraded (2008)    Current Outpatient Prescriptions  Medication Sig Dispense Refill  . albuterol (PROAIR HFA) 108 (90 BASE) MCG/ACT inhaler Inhale 2 puffs into the lungs every 6 (six) hours as needed for wheezing or shortness of breath.  1 Inhaler  5  . albuterol (PROVENTIL) (2.5 MG/3ML) 0.083% nebulizer solution Take 2.5 mg by nebulization 2 (two) times daily.      . budesonide (PULMICORT) 0.25 MG/2ML nebulizer solution Take 2 mLs (0.25 mg total) by nebulization 2 (two) times daily.  120 mL  12  . carvedilol (COREG) 12.5 MG tablet Take  1 tablet (12.5 mg total) by mouth 2 (two) times daily with a meal. Take 1 tablet by mouth twice per day.  60 tablet  3  . flunisolide (NASALIDE) 0.025 % SOLN Place 2 sprays into the nose as needed.       . furosemide (LASIX) 40 MG tablet Take 20 mg by mouth daily as needed. For excess fluid retention      . guaiFENesin (MUCINEX) 600 MG 12 hr tablet Take 1,200 mg by mouth 2 (two) times daily as needed.      . Multiple Vitamin (MULTIVITAMIN WITH MINERALS) TABS Take 1 tablet by mouth daily.      Marland Kitchen tiotropium (SPIRIVA) 18 MCG inhalation capsule Place 1 capsule (18 mcg total) into inhaler and inhale daily.  30 capsule  6  . warfarin (COUMADIN) 5 MG tablet Take 7.5 mg tonight (12/01/11), 10mg  tomorrow (12/02/11) and 7.5mg  (12/03/11). Please have your INR checked by Wednesday the 10th for further dosing recommendations.       No current facility-administered medications for this visit.    Physical Exam: Filed Vitals:   09/06/12 1238  BP: 144/79  Pulse: 85  Height: 5\' 11"  (1.803 m)  Weight: 169 lb (76.658 kg)    GEN- The patient is well appearing, alert and oriented x 3 today.   Head- normocephalic, atraumatic  Eyes-  Sclera clear, conjunctiva pink Ears- hearing intact Oropharynx- clear Lungs- Coarse BS at the bases, poor expiratory movement, normal work of breathing Chest- pacemaker pocket is well healed Heart- Regular rate and rhythm, no murmurs, rubs or gallops, PMI not laterally displaced GI- soft, NT, ND, + BS Extremities- no clubbing, cyanosis, or edema  Pacemaker interrogation- reviewed in detail today,  See PACEART report  Assessment and Plan:  1. Complete heart block Normal pacemaker function See Pace Art report No changes today Return in 2 months for battery check with Kristen  2. Nonischemic CM/ Chronic systolic dysfunction Stable Due to concerns of cough, will stop bisoprolol and restart coreg per the patients request.  He will continue to follow up with our general  cardiology team for CHF management.  Return to the device clinic in 2months

## 2012-09-06 NOTE — Assessment & Plan Note (Deleted)
Patient continues to experience persistent DOE, with no recent evidence of ischemia or heart failure. We felt that his symptoms were most likely related to severe COPD. He did not tolerate bisoprolol, and we will place him back on carvedilol at previous dose of 12.5 twice a day. Patient has normal LVF, as confirmed by recent echocardiogram. He had normal coronary arteries, by cardiac catheterization in 2012.

## 2012-09-06 NOTE — Patient Instructions (Addendum)
Your physician recommends that you schedule a follow-up appointment in: 4 months. You will receive a reminder letter in the mail in about 1-2 months reminding you to call and schedule your appointment. If you don't receive this letter, please contact our office. Your physician has recommended you make the following change in your medication: Stop bisoprolol. Restart your carvedilol 12. 5 mg twice daily. All other medications will remain the same.

## 2012-11-12 ENCOUNTER — Ambulatory Visit (INDEPENDENT_AMBULATORY_CARE_PROVIDER_SITE_OTHER): Payer: Medicare Other | Admitting: *Deleted

## 2012-11-12 DIAGNOSIS — I442 Atrioventricular block, complete: Secondary | ICD-10-CM

## 2012-11-12 LAB — PACEMAKER DEVICE OBSERVATION
AL IMPEDENCE PM: 313 Ohm
ATRIAL PACING PM: 97
DEVICE MODEL PM: 1873447
RV LEAD IMPEDENCE PM: 437 Ohm
VENTRICULAR PACING PM: 99

## 2012-11-12 NOTE — Progress Notes (Signed)
PPM check in office. 

## 2012-12-16 ENCOUNTER — Encounter: Payer: Self-pay | Admitting: Internal Medicine

## 2013-01-01 IMAGING — CR DG CHEST 1V PORT
1 series · 1 of 1 positions shown · non-contrast
Comparison: Chest x-ray 10/31/2011.

CLINICAL DATA: Evaluate for airspace disease.

PORTABLE CHEST - 1 VIEW

[AP]
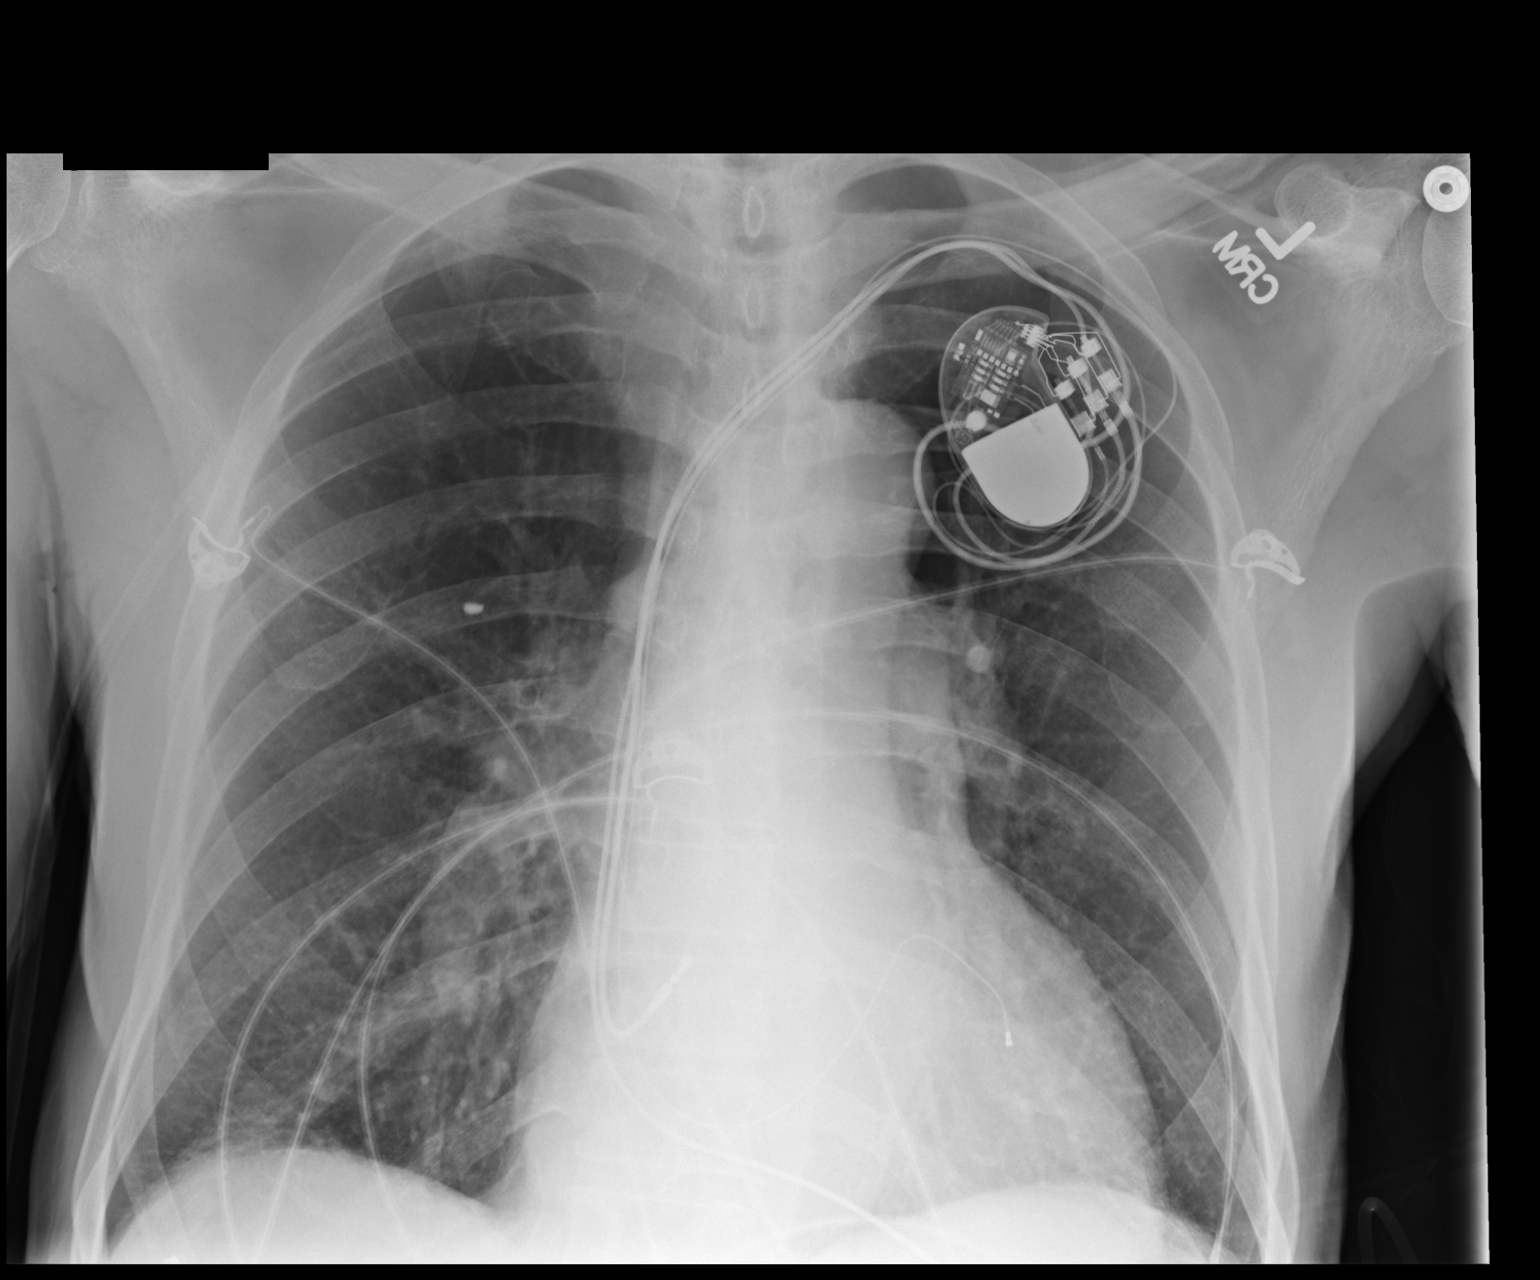

[1 of 1 positions shown; findings below may reference images not displayed]

FINDINGS: Lungs appear hyperexpanded with pruning of the pulmonary
vasculature in the periphery.  No consolidative airspace disease.
No definite pleural effusions (left costophrenic sulcus is excluded
from the lower margin of the image).  Pulmonary vasculature is
within normal limits.  Heart size is borderline enlarged
(unchanged).  Mediastinal contours are unremarkable.  The sided by
a ventricular pacemaker / AICD in place with lead tips projecting
over the expected location of the right atrium, right ventricle,
and lateral wall of the left ventricle (via the coronary sinus and
coronary veins).  Calcified granuloma in the right suprahilar
region is again noted.
IMPRESSION: 1.  No radiographic evidence of acute cardiopulmonary disease.
2.  Chronic changes related to COPD, as above.

## 2013-01-06 ENCOUNTER — Encounter: Payer: Self-pay | Admitting: Physician Assistant

## 2013-01-08 ENCOUNTER — Encounter: Payer: Self-pay | Admitting: Physician Assistant

## 2013-01-09 DIAGNOSIS — R0602 Shortness of breath: Secondary | ICD-10-CM

## 2013-01-14 ENCOUNTER — Ambulatory Visit (INDEPENDENT_AMBULATORY_CARE_PROVIDER_SITE_OTHER): Payer: Medicare Other | Admitting: *Deleted

## 2013-01-14 DIAGNOSIS — I442 Atrioventricular block, complete: Secondary | ICD-10-CM

## 2013-01-14 LAB — PACEMAKER DEVICE OBSERVATION
AL IMPEDENCE PM: 325 Ohm
ATRIAL PACING PM: 97
BAMS-0003: 70 {beats}/min
DEVICE MODEL PM: 1873447

## 2013-01-14 NOTE — Progress Notes (Signed)
Pacemaker check in clinic for battery check. Pt was interrogated on 11-12-12 showing 0.75 years to ERI. todays interrogation showed 1.75 years. After investigating, rate response was tweaked at last visit --- changing threshold from Auto (-0.5) to Auto (+ 0.0) causing battery longevity to increase. Pt has recently been hospitalized for SOB. Changes were made to previous settings. Pt was walked around office that showed heart rate increase of around 83-90. followup in 3 mths w/device clinic for battery check because of changes decreasing battery longevity.

## 2013-01-19 ENCOUNTER — Ambulatory Visit (INDEPENDENT_AMBULATORY_CARE_PROVIDER_SITE_OTHER): Payer: Medicare Other | Admitting: Cardiovascular Disease

## 2013-01-19 ENCOUNTER — Encounter: Payer: Self-pay | Admitting: Cardiovascular Disease

## 2013-01-19 VITALS — BP 122/61 | HR 77 | Ht 71.0 in | Wt 158.1 lb

## 2013-01-19 DIAGNOSIS — Z950 Presence of cardiac pacemaker: Secondary | ICD-10-CM

## 2013-01-19 DIAGNOSIS — I272 Pulmonary hypertension, unspecified: Secondary | ICD-10-CM

## 2013-01-19 DIAGNOSIS — I350 Nonrheumatic aortic (valve) stenosis: Secondary | ICD-10-CM

## 2013-01-19 DIAGNOSIS — Z86711 Personal history of pulmonary embolism: Secondary | ICD-10-CM

## 2013-01-19 DIAGNOSIS — I429 Cardiomyopathy, unspecified: Secondary | ICD-10-CM

## 2013-01-19 DIAGNOSIS — I38 Endocarditis, valve unspecified: Secondary | ICD-10-CM

## 2013-01-19 DIAGNOSIS — I359 Nonrheumatic aortic valve disorder, unspecified: Secondary | ICD-10-CM

## 2013-01-19 DIAGNOSIS — R5381 Other malaise: Secondary | ICD-10-CM

## 2013-01-19 DIAGNOSIS — I2789 Other specified pulmonary heart diseases: Secondary | ICD-10-CM

## 2013-01-19 DIAGNOSIS — Z86718 Personal history of other venous thrombosis and embolism: Secondary | ICD-10-CM

## 2013-01-19 DIAGNOSIS — R5383 Other fatigue: Secondary | ICD-10-CM

## 2013-01-19 DIAGNOSIS — I442 Atrioventricular block, complete: Secondary | ICD-10-CM

## 2013-01-19 MED ORDER — CARVEDILOL 6.25 MG PO TABS: 6.2500 mg | ORAL_TABLET | Freq: Two times a day (BID) | ORAL | Status: DC

## 2013-01-19 NOTE — Patient Instructions (Addendum)
   Decrease Coreg to 6.25mg  twice a day  - new sent to pharm (may take 1/2 tablet of your 12.5mg  tablet twice a day till finish current supply) Continue all other medications.    Monitor blood pressure readings over next few weeks & bring or mail back to office  Your physician wants you to follow up in: 6 months.  You will receive a reminder letter in the mail one-two months in advance.  If you don't receive a letter, please call our office to schedule the follow up appointment

## 2013-01-19 NOTE — Progress Notes (Signed)
Patient ID: [name] [name], male   DOB: [birth year], 79 y.o.   MRN: [id number]   SUBJECTIVE: [name] [name] was recently hospitalized for a COPD exacerbation.   He previously had a non-ischemic cardiomyopathy, but his LV systolic function was found to be normal by echocardiography in March 2014. He has no significant CAD. He has moderate pulmonary HTN and mild aortic stenosis, and moderate aortic regurgitation, with moderate aortic root dilatation.  He says he has no energy. He had his pacemaker interrogated and adjustments were made on 8/22. He feels less short of breath. He wears oxygen at home, but can't tell if it helps him. He denies chest pain.  When he wakes up he feels fine, but after 10 am he feels his energy levels diminish.    Allergies  Allergen Reactions  . Lisinopril Swelling    Angioedema  . Aspirin Nausea And Vomiting    Current Outpatient Prescriptions  Medication Sig Dispense Refill  . albuterol (PROVENTIL HFA;VENTOLIN HFA) 108 (90 BASE) MCG/ACT inhaler Inhale 2 puffs into the lungs every 6 (six) hours as needed for wheezing or shortness of breath.      Marland Kitchen albuterol (PROVENTIL) (2.5 MG/3ML) 0.083% nebulizer solution Take 2.5 mg by nebulization 2 (two) times daily.      . budesonide (PULMICORT) 0.25 MG/2ML nebulizer solution Take 2 mLs (0.25 mg total) by nebulization 2 (two) times daily.  120 mL  12  . carvedilol (COREG) 12.5 MG tablet Take 1 tablet (12.5 mg total) by mouth 2 (two) times daily with a meal. Take 1 tablet by mouth twice per day.  60 tablet  6  . flunisolide (NASALIDE) 0.025 % SOLN Place 2 sprays into the nose as needed.       . furosemide (LASIX) 40 MG tablet Take 20 mg by mouth daily as needed. For excess fluid retention      . Multiple Vitamin (MULTIVITAMIN WITH MINERALS) TABS Take 1 tablet by mouth daily.      Marland Kitchen warfarin (COUMADIN) 5 MG tablet Take 7.5 mg tonight (12/01/11), 10mg  tomorrow (12/02/11) and 7.5mg  (12/03/11). Please have your INR checked by Wednesday the  10th for further dosing recommendations.       No current facility-administered medications for this visit.    Past Medical History  Diagnosis Date  . COPD (chronic obstructive pulmonary disease)     GOLD stage IV COPD on home oxygen;   . Pulmonary embolism     Small LLL sub-segmental PE seen on CT 11/2011, unchanged from CT in May 2013; Chronic Coumadin Therapy  . Nonischemic cardiomyopathy     Mild-EF 45-50%  . CHB (complete heart block)     S/P PPM with CRT upgrade in 2008  . Tobacco abuse      Quit in the 1990s  . Hypercholesterolemia   . Peripheral neuropathy   . Hypertension   . Arthritis   . Allergic rhinitis   . Prostate cancer     s/p Prostatectomy 2008   . Pulmonary HTN     RHC 11/28/11 revealed minimally elevated PA pressures, nl wedge/cardiac output  . Aortic dissection     Localized ascending aortic dissection seen on CT 11/2011, unchanged from CT in May 2013, ? artifact    Past Surgical History  Procedure Laterality Date  . Prostatectomy    . Pacemaker insertion  09/15/06    St. Jude Upgraded (2008)    History   Social History  . Marital Status: Married    Spouse Name:  N/A    Number of Children: N/A  . Years of Education: N/A   Occupational History  . 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 Smoking status: Former Smoker -- 0.50 packs/day for 52 years    Types: Cigarettes    Quit date: 05/26/1993  . Smokeless tobacco: Former Neurosurgeon    Types: Chew    Quit date: 05/26/1990     Comment: chewed tobacco 40 years less than a pack/day  . Alcohol Use: No     Comment: denies alcohol use  . Drug Use: No  . Sexual Activity: Not on file   Other Topics Concern  . Not on file   Social History Narrative   Retired Surveyor, minerals.  Lives with wife of 61 years.    @[employer] @  Filed Vitals:   01/19/13 1321  BP: 122/61  Pulse: 77  Height: 5\' 11"  (1.803 m)  Weight: 158 lb 1.9 oz (71.723 kg)    PHYSICAL EXAM General: NAD Neck: No JVD, no  thyromegaly or thyroid nodule.  Lungs: Clear to auscultation bilaterally with normal respiratory effort. CV: Nondisplaced PMI.  Heart regular S1/S2, no S3/S4, II/VI ejection systolic murmur at RUSB.  No peripheral edema.  No carotid bruit.  Normal pedal pulses.  Abdomen: Soft, nontender, no hepatosplenomegaly, no distention.  Neurologic: Alert and oriented x 3.  Psych: Normal affect. Extremities: No clubbing or cyanosis.   ECG: reviewed and available in electronic records.      ASSESSMENT AND PLAN: Biventricular cardiac pacemaker St Judes  Followed by Dr. Johney Frame   Cardiomyopathy nonischemic  Patient has normal LV systolic function, as confirmed by echocardiogram in March 2014. He had normal coronary arteries, by cardiac catheterization in 2012.  Because of his diminished energy levels, will reduce Coreg to 6.25 mg bid to see if this helps. I've asked him to check his BP and HR at home for the next few weeks to see if he has any episodes of hypotension or bradycardia which could be contributing to his symptoms.  History of pulmonary embolus  On chronic Coumadin anticoagulation, followed by primary M.D.   Valvular heart disease  In March 2014, found to have mild aortic stenosis, moderate aortic regurgitation, and moderate aortic root dilatation (4.4 cm), with moderate pulmonary HTN.  Pulmonary hypertension  Assessed as moderate, by echocardiogram in 07/2012 (RVSP 57 mmHg)   Aortic root dilatation Moderate as per echo in March 2014. Continue to monitor.      Prentice Docker, M.D., F.A.C.C.

## 2013-01-29 ENCOUNTER — Encounter: Payer: Self-pay | Admitting: Internal Medicine

## 2013-03-28 ENCOUNTER — Ambulatory Visit (INDEPENDENT_AMBULATORY_CARE_PROVIDER_SITE_OTHER): Payer: Medicare Other | Admitting: *Deleted

## 2013-03-28 DIAGNOSIS — I442 Atrioventricular block, complete: Secondary | ICD-10-CM

## 2013-03-28 LAB — PACEMAKER DEVICE OBSERVATION
BAMS-0001: 170 {beats}/min
BAMS-0003: 70 {beats}/min
DEVICE MODEL PM: 1873447
LV LEAD IMPEDENCE PM: 447 Ohm

## 2013-03-28 NOTE — Progress Notes (Signed)
Pacemaker check in clinic for battery check. Battery longevity 1.50 years. ROV 07-11-13 @ 1000 with [facility].

## 2013-04-08 ENCOUNTER — Encounter: Payer: Self-pay | Admitting: Internal Medicine

## 2013-04-14 ENCOUNTER — Telehealth: Payer: Self-pay | Admitting: Pulmonary Disease

## 2013-04-14 MED ORDER — ALBUTEROL SULFATE (2.5 MG/3ML) 0.083% IN NEBU: 2.5000 mg | INHALATION_SOLUTION | Freq: Two times a day (BID) | RESPIRATORY_TRACT | Status: DC

## 2013-04-14 MED ORDER — BUDESONIDE 0.25 MG/2ML IN SUSP: 0.2500 mg | Freq: Two times a day (BID) | RESPIRATORY_TRACT | Status: DC

## 2013-04-14 NOTE — Telephone Encounter (Signed)
Pt has not been seen since 06/2012. Advised him that he would need an OV soon. Has been scheduled for 06/08/13 at 10am. Meds will be refilled to last him until then.

## 2013-04-18 ENCOUNTER — Telehealth: Payer: Self-pay | Admitting: Pulmonary Disease

## 2013-04-18 MED ORDER — ALBUTEROL SULFATE (2.5 MG/3ML) 0.083% IN NEBU: 2.5000 mg | INHALATION_SOLUTION | Freq: Two times a day (BID) | RESPIRATORY_TRACT | Status: AC

## 2013-04-18 MED ORDER — BUDESONIDE 0.25 MG/2ML IN SUSP: 0.2500 mg | Freq: Two times a day (BID) | RESPIRATORY_TRACT | Status: DC

## 2013-04-18 NOTE — Telephone Encounter (Signed)
Rx has been signed by [name]. Bjorn Loser will fax to [facility].

## 2013-04-18 NOTE — Telephone Encounter (Signed)
Pt is needing rx's sent to [facility]. I have printed these off and will Hva [name] sign these and give to pcc's to fax. Will forward to [place] once done

## 2013-06-08 ENCOUNTER — Ambulatory Visit (INDEPENDENT_AMBULATORY_CARE_PROVIDER_SITE_OTHER): Payer: Medicare Other | Admitting: Pulmonary Disease

## 2013-06-08 ENCOUNTER — Encounter: Payer: Self-pay | Admitting: Pulmonary Disease

## 2013-06-08 VITALS — BP 124/78 | HR 78 | Temp 97.8°F | Ht 71.0 in | Wt 164.0 lb

## 2013-06-08 DIAGNOSIS — J9610 Chronic respiratory failure, unspecified whether with hypoxia or hypercapnia: Secondary | ICD-10-CM

## 2013-06-08 DIAGNOSIS — J9611 Chronic respiratory failure with hypoxia: Secondary | ICD-10-CM

## 2013-06-08 DIAGNOSIS — J438 Other emphysema: Secondary | ICD-10-CM

## 2013-06-08 DIAGNOSIS — J439 Emphysema, unspecified: Secondary | ICD-10-CM

## 2013-06-08 DIAGNOSIS — R0902 Hypoxemia: Secondary | ICD-10-CM

## 2013-06-08 NOTE — Assessment & Plan Note (Signed)
Stable on current regimen of albuterol and pulmicort.  Discussed the proper use and timing of his nebulizer medications.

## 2013-06-08 NOTE — Patient Instructions (Signed)
Follow up in 6 months 

## 2013-06-08 NOTE — Progress Notes (Signed)
Chief Complaint  Patient presents with  . COPD    Breathing is unchanged. Reports DOE and slight wheezing. Denies chest tightness or coughing at this time.    History of Present Illness: [name] [name] is a 80 y.o. male former smoker with GOLD 4 COPD/emphysema, and hypoxemia.  He gets occasional cough and wheeze.  He denies chest pain or leg swelling.  He uses albuterol twice per day, and pulmicort twice per day.  He does okay with activity at a steady pace, but gets winded with more brisk activity.  He is using his oxygen at night, but sometimes puts it on during the day also.  TESTS: Spirometry 04/09/11 >> FEV1 0.69 (24%), FEV1% 37 PFT 05/09/11 >> FEV1 1.16(42%), FEV1% 45, TLC 5.22(81%), DLCO 40%, +BD RA ONO 11/13/11 >> Test time 8 hrs 41 min.  Average SpO2 92.6%, low SpO2 85%.  Spent 13 min with SpO2 < 88%. Start 1 liter oxygen qhs 11/22/11  He  has a past medical history of COPD (chronic obstructive pulmonary disease); Pulmonary embolism; Nonischemic cardiomyopathy; CHB (complete heart block); Tobacco abuse; Hypercholesterolemia; Peripheral neuropathy; Hypertension; Arthritis; Allergic rhinitis; Prostate cancer; Pulmonary HTN; and Aortic dissection.  He  has past surgical history that includes Prostatectomy and Pacemaker insertion (09/15/06).   Current Outpatient Prescriptions on File Prior to Visit  Medication Sig Dispense Refill  . albuterol (PROVENTIL HFA;VENTOLIN HFA) 108 (90 BASE) MCG/ACT inhaler Inhale 2 puffs into the lungs every 6 (six) hours as needed for wheezing or shortness of breath.      Marland Kitchen albuterol (PROVENTIL) (2.5 MG/3ML) 0.083% nebulizer solution Take 3 mLs (2.5 mg total) by nebulization 2 (two) times daily.  75 mL  6  . budesonide (PULMICORT) 0.25 MG/2ML nebulizer solution Take 2 mLs (0.25 mg total) by nebulization 2 (two) times daily.  120 mL  6  . carvedilol (COREG) 6.25 MG tablet Take 1 tablet (6.25 mg total) by mouth 2 (two) times daily with a meal.  60 tablet  6  .  flunisolide (NASALIDE) 0.025 % SOLN Place 2 sprays into the nose as needed.       . furosemide (LASIX) 40 MG tablet Take 20 mg by mouth daily as needed. For excess fluid retention      . Multiple Vitamin (MULTIVITAMIN WITH MINERALS) TABS Take 1 tablet by mouth daily.      Marland Kitchen warfarin (COUMADIN) 5 MG tablet Take 7.5 mg tonight (12/01/11), 10mg  tomorrow (12/02/11) and 7.5mg  (12/03/11). Please have your INR checked by Wednesday the 10th for further dosing recommendations.       No current facility-administered medications on file prior to visit.    Allergies  Allergen Reactions  . Lisinopril Swelling    Angioedema  . Aspirin Nausea And Vomiting    Physical Exam:  General - Thin  HEENT - PERRLA, EOMI, no sinus tenderness, no oral exudate, no LAN, no thyromegaly  Cardiac - s1s2 regular, 2/6 systolic murmur  Chest - prolonged exhalation, no wheeze/rales/dullness  Abdomen - soft, non-tender Extremities - no edema Neurologic - normal strength  Skin - no rashes  Psychiatric - normal mood, behavior   Assessment/Plan:  Chesley Mires, MD [place] 06/08/2013, 10:00 AM Pager:  [phone] After 3pm call: [phone]

## 2013-06-08 NOTE — Assessment & Plan Note (Addendum)
He had borderline oxygen desaturation today in office with exertion.  He can uses oxygen with exertion at home.  He is to continue supplemental oxygen at night.

## 2013-07-11 ENCOUNTER — Encounter: Payer: Self-pay | Admitting: Internal Medicine

## 2013-07-11 ENCOUNTER — Ambulatory Visit (INDEPENDENT_AMBULATORY_CARE_PROVIDER_SITE_OTHER): Payer: Medicare Other | Admitting: Internal Medicine

## 2013-07-11 VITALS — BP 164/75 | HR 69 | Ht 71.0 in | Wt 159.0 lb

## 2013-07-11 DIAGNOSIS — J439 Emphysema, unspecified: Secondary | ICD-10-CM

## 2013-07-11 DIAGNOSIS — I442 Atrioventricular block, complete: Secondary | ICD-10-CM

## 2013-07-11 DIAGNOSIS — J438 Other emphysema: Secondary | ICD-10-CM

## 2013-07-11 DIAGNOSIS — I429 Cardiomyopathy, unspecified: Secondary | ICD-10-CM

## 2013-07-11 NOTE — Progress Notes (Signed)
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  Since last being seen in our clinic, the patient reports doing very well.  He has stable SOB.  He has severe COPD and is followed by Dr Halford Chessman for this.  Today, he denies symptoms of palpitations, chest pain, lower extremity edema, dizziness, presyncope, or syncope.  The patient is otherwise without complaint today.   Past Medical History  Diagnosis Date  . COPD (chronic obstructive pulmonary disease)     GOLD stage IV COPD on home oxygen;   . Pulmonary embolism     Small LLL sub-segmental PE seen on CT 11/2011, unchanged from CT in May 2013; Chronic Coumadin Therapy  . Nonischemic cardiomyopathy     Mild-EF 45-50%  . CHB (complete heart block)     S/P PPM with CRT upgrade in 2008  . Tobacco abuse      Quit in the 1990s  . Hypercholesterolemia   . Peripheral neuropathy   . Hypertension   . Arthritis   . Allergic rhinitis   . Prostate cancer     s/p Prostatectomy 2008   . Pulmonary HTN     RHC 11/28/11 revealed minimally elevated PA pressures, nl wedge/cardiac output  . Aortic dissection     Localized ascending aortic dissection seen on CT 11/2011, unchanged from CT in May 2013, ? artifact   Past Surgical History  Procedure Laterality Date  . Prostatectomy    . Pacemaker insertion  09/15/06    St. Jude Upgraded (2008)    Current Outpatient Prescriptions  Medication Sig Dispense Refill  . albuterol (PROVENTIL HFA;VENTOLIN HFA) 108 (90 BASE) MCG/ACT inhaler Inhale 2 puffs into the lungs every 6 (six) hours as needed for wheezing or shortness of breath.      Marland Kitchen albuterol (PROVENTIL) (2.5 MG/3ML) 0.083% nebulizer solution Take 3 mLs (2.5 mg total) by nebulization 2 (two) times daily.  75 mL  6  . budesonide (PULMICORT) 0.25 MG/2ML nebulizer solution Take 2 mLs (0.25 mg total) by nebulization 2 (two) times daily.  120 mL  6  . carvedilol  (COREG) 6.25 MG tablet Take 1 tablet (6.25 mg total) by mouth 2 (two) times daily with a meal.  60 tablet  6  . flunisolide (NASALIDE) 0.025 % SOLN Place 2 sprays into the nose as needed.       . furosemide (LASIX) 40 MG tablet Take 20 mg by mouth daily as needed. For excess fluid retention      . Multiple Vitamin (MULTIVITAMIN WITH MINERALS) TABS Take 1 tablet by mouth daily.      Marland Kitchen warfarin (COUMADIN) 5 MG tablet Take 7.5 mg tonight (12/01/11), 10mg  tomorrow (12/02/11) and 7.5mg  (12/03/11). Please have your INR checked by Wednesday the 10th for further dosing recommendations.       No current facility-administered medications for this visit.    Physical Exam: Filed Vitals:   07/11/13 0941  BP: 164/75  Pulse: 69  Height: 5\' 11"  (1.803 m)  Weight: 159 lb (72.122 kg)    GEN- The patient is well appearing, alert and oriented x 3 today.   Head- normocephalic, atraumatic Eyes-  Sclera clear, conjunctiva pink Ears- hearing intact Oropharynx- clear Lungs- Coarse BS at the bases, poor expiratory movement, normal work of breathing Chest- pacemaker pocket is well healed Heart- Regular rate and rhythm, no murmurs, rubs or gallops, PMI not laterally displaced GI- soft, NT, ND, +  BS Extremities- no clubbing, cyanosis, or edema  Pacemaker interrogation- reviewed in detail today,  See PACEART report  Assessment and Plan:  1. Complete heart block Normal pacemaker function See Claudia Desanctis Art report No changes today Return in 3 months for battery check with Kristen.  We may have to increase frequency of follow-up at that time  2. Nonischemic CM/ Chronic systolic dysfunction Stable No changes today  3. HTN Slightly elevated Salt restriction advised  He will continue to follow up with Dr Bronson Ing as scheduled  Return to the device clinic in 3 months I will see again in 1 year unless he reaches ERI before then

## 2013-07-11 NOTE — Patient Instructions (Signed)
Your physician recommends that you schedule a follow-up appointment in: 3 month with [facility].  Your physician recommends that you schedule a follow-up appointment in: 1 year with Dr. Rayann Heman. You should receive a letter in the mail in 10 months. If you do not receive this letter by December 2015 call our office to schedule this appointment.   Your physician recommends that you continue on your current medications as directed. Please refer to the Current Medication list given to you today.

## 2013-07-13 LAB — MDC_IDC_ENUM_SESS_TYPE_INCLINIC
Battery Impedance: 4300 Ohm
Battery Remaining Longevity: 12 mo
Battery Voltage: 2.72 V
Date Time Interrogation Session: 20150216153718
Lead Channel Impedance Value: 317 Ohm
Lead Channel Pacing Threshold Amplitude: 0.5 V
Lead Channel Pacing Threshold Pulse Width: 0.5 ms
Lead Channel Pacing Threshold Pulse Width: 0.5 ms
Lead Channel Sensing Intrinsic Amplitude: 1.62 mV
Lead Channel Setting Pacing Amplitude: 2 V
Lead Channel Setting Pacing Pulse Width: 0.5 ms
MDC IDC MSMT LEADCHNL LV IMPEDANCE VALUE: 420 Ohm
MDC IDC MSMT LEADCHNL LV PACING THRESHOLD AMPLITUDE: 0.75 V
MDC IDC MSMT LEADCHNL RA PACING THRESHOLD PULSEWIDTH: 0.5 ms
MDC IDC MSMT LEADCHNL RV IMPEDANCE VALUE: 505 Ohm
MDC IDC MSMT LEADCHNL RV PACING THRESHOLD AMPLITUDE: 0.75 V
MDC IDC PG SERIAL: 1873447
MDC IDC SET LEADCHNL RA PACING AMPLITUDE: 2 V
MDC IDC SET LEADCHNL RV PACING AMPLITUDE: 2.5 V
MDC IDC SET LEADCHNL RV SENSING SENSITIVITY: 4 mV

## 2013-07-25 ENCOUNTER — Ambulatory Visit (INDEPENDENT_AMBULATORY_CARE_PROVIDER_SITE_OTHER): Payer: Medicare Other | Admitting: Cardiovascular Disease

## 2013-07-25 ENCOUNTER — Encounter: Payer: Self-pay | Admitting: Cardiovascular Disease

## 2013-07-25 VITALS — BP 140/65 | HR 77 | Ht 71.5 in | Wt 163.0 lb

## 2013-07-25 DIAGNOSIS — R5383 Other fatigue: Secondary | ICD-10-CM

## 2013-07-25 DIAGNOSIS — I2789 Other specified pulmonary heart diseases: Secondary | ICD-10-CM

## 2013-07-25 DIAGNOSIS — I77810 Thoracic aortic ectasia: Secondary | ICD-10-CM

## 2013-07-25 DIAGNOSIS — Z950 Presence of cardiac pacemaker: Secondary | ICD-10-CM

## 2013-07-25 DIAGNOSIS — I351 Nonrheumatic aortic (valve) insufficiency: Secondary | ICD-10-CM

## 2013-07-25 DIAGNOSIS — Z86718 Personal history of other venous thrombosis and embolism: Secondary | ICD-10-CM

## 2013-07-25 DIAGNOSIS — I359 Nonrheumatic aortic valve disorder, unspecified: Secondary | ICD-10-CM

## 2013-07-25 DIAGNOSIS — R0602 Shortness of breath: Secondary | ICD-10-CM

## 2013-07-25 DIAGNOSIS — J439 Emphysema, unspecified: Secondary | ICD-10-CM

## 2013-07-25 DIAGNOSIS — R5381 Other malaise: Secondary | ICD-10-CM

## 2013-07-25 DIAGNOSIS — I350 Nonrheumatic aortic (valve) stenosis: Secondary | ICD-10-CM

## 2013-07-25 DIAGNOSIS — I272 Pulmonary hypertension, unspecified: Secondary | ICD-10-CM

## 2013-07-25 DIAGNOSIS — J438 Other emphysema: Secondary | ICD-10-CM

## 2013-07-25 DIAGNOSIS — I429 Cardiomyopathy, unspecified: Secondary | ICD-10-CM

## 2013-07-25 NOTE — Progress Notes (Signed)
Patient ID: [name] [name], male   DOB: Aug 01, 1927, 80 y.o.   MRN: [id number]      SUBJECTIVE: The patient is an 80 year old male with a history of a nonischemic cardiomyopathy and significant COPD, for which he uses albuterol, Pulmicort, and oxygen at home. In March 4098, his LV systolic function was found to be normal. He also has moderate pulmonary hypertension, mild aortic stenosis, moderate aortic regurgitation, and moderate aortic root dilatation. He also has a pacemaker. He does use albuterol and Pulmicort twice daily and experiences some relief with that. He does say he gets short of breath with any significant activity. He denies chest pain, palpitations, lightheadedness, leg swelling, orthopnea, and paroxysmal nocturnal dyspnea. He feels that his shortness of breath may have gotten worse since my evaluation of him last summer.  He was recently evaluated by Dr. Rayann Heman and Dr. Halford Chessman and he was felt to be stable.    Allergies  Allergen Reactions  . Lisinopril Swelling    Angioedema  . Aspirin Nausea And Vomiting    Current Outpatient Prescriptions  Medication Sig Dispense Refill  . albuterol (PROVENTIL HFA;VENTOLIN HFA) 108 (90 BASE) MCG/ACT inhaler Inhale 2 puffs into the lungs every 6 (six) hours as needed for wheezing or shortness of breath.      Marland Kitchen albuterol (PROVENTIL) (2.5 MG/3ML) 0.083% nebulizer solution Take 3 mLs (2.5 mg total) by nebulization 2 (two) times daily.  75 mL  6  . budesonide (PULMICORT) 0.25 MG/2ML nebulizer solution Take 2 mLs (0.25 mg total) by nebulization 2 (two) times daily.  120 mL  6  . carvedilol (COREG) 6.25 MG tablet Take 1 tablet (6.25 mg total) by mouth 2 (two) times daily with a meal.  60 tablet  6  . flunisolide (NASALIDE) 0.025 % SOLN Place 2 sprays into the nose as needed.       . furosemide (LASIX) 40 MG tablet Take 20 mg by mouth daily as needed. For excess fluid retention      . Multiple Vitamin (MULTIVITAMIN WITH MINERALS) TABS Take 1 tablet by  mouth daily.      Marland Kitchen warfarin (COUMADIN) 5 MG tablet Take 7.5 mg tonight (12/01/11), 10mg  tomorrow (12/02/11) and 7.5mg  (12/03/11). Please have your INR checked by Wednesday the 10th for further dosing recommendations.       No current facility-administered medications for this visit.    Past Medical History  Diagnosis Date  . COPD (chronic obstructive pulmonary disease)     GOLD stage IV COPD on home oxygen;   . Pulmonary embolism     Small LLL sub-segmental PE seen on CT 11/2011, unchanged from CT in May 2013; Chronic Coumadin Therapy  . Nonischemic cardiomyopathy     Mild-EF 45-50%  . CHB (complete heart block)     S/P PPM with CRT upgrade in 2008  . Tobacco abuse      Quit in the 1990s  . Hypercholesterolemia   . Peripheral neuropathy   . Hypertension   . Arthritis   . Allergic rhinitis   . Prostate cancer     s/p Prostatectomy 2008   . Pulmonary HTN     RHC 11/28/11 revealed minimally elevated PA pressures, nl wedge/cardiac output  . Aortic dissection     Localized ascending aortic dissection seen on CT 11/2011, unchanged from CT in May 2013, ? artifact    Past Surgical History  Procedure Laterality Date  . Prostatectomy    . Pacemaker insertion  09/15/06    St.  Jude Upgraded (2008)    History   Social History  . Marital Status: Married    Spouse Name: N/A    Number of Children: N/A  . Years of Education: N/A   Occupational History  . 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 Smoking status: Former Smoker -- 0.50 packs/day for 52 years    Types: Cigarettes    Quit date: 05/26/1993  . Smokeless tobacco: Former Systems developer    Types: Chew    Quit date: 05/26/1990     Comment: chewed tobacco 40 years less than a pack/day  . Alcohol Use: No     Comment: denies alcohol use  . Drug Use: No  . Sexual Activity: Not on file   Other Topics Concern  . Not on file   Social History Narrative   Retired Chief Strategy Officer.  Lives with wife of 62 years.     Filed  Vitals:   07/25/13 0944  BP: 140/65  Pulse: 77  Height: 5' 11.5" (1.816 m)  Weight: 163 lb (73.936 kg)  SpO2: 97%    PHYSICAL EXAM General: NAD Neck: No JVD, no thyromegaly. Lungs: Clear to auscultation bilaterally with normal respiratory effort. CV: Nondisplaced PMI.  Regular rate and rhythm, normal S1/S2, no S3/S4, III/VI late-peaking ejection systolic murmur best heard at RUSB, but appreciated throughout precordium. No pretibial or periankle edema.  No carotid bruit.  Normal pedal pulses.  Abdomen: Soft, nontender, no hepatosplenomegaly, no distention.  Neurologic: Alert and oriented x 3.  Psych: Normal affect. Extremities: No clubbing or cyanosis.   ECG: reviewed and available in electronic records.      ASSESSMENT AND PLAN:  Biventricular cardiac pacemaker St Jude  Followed by Dr. Rayann Heman. Recently evaluated and found to be functioning appropriately.  Cardiomyopathy nonischemic  Patient has normal LV systolic function, as confirmed by echocardiogram in March 2014. He had normal coronary arteries, by cardiac catheterization in 2012.  Because of his progressive dyspnea on exertion, I will obtain an echocardiogram to see if he's had any progression of his aortic stenosis and/or regurgitation since one year ago. His symptoms may be attributable to his COPD as well, but I want to rule out progression of valvular heart disease.  History of pulmonary embolus  On chronic Coumadin anticoagulation, followed by primary M.D.   Valvular heart disease  In March 2014, found to have mild aortic stenosis, moderate aortic regurgitation, and moderate aortic root dilatation (4.4 cm), with moderate pulmonary HTN. Will repeat echo since progression of symptoms.  Pulmonary hypertension  Assessed as moderate, by echocardiogram in 07/2012 (RVSP 57 mmHg).  Aortic root dilatation  Moderate as per echo in March 2014. Continue to monitor.     Dispo: f/u 4-6 weeks after echo.   Kate Sable, M.D., F.A.C.C.

## 2013-07-25 NOTE — Patient Instructions (Signed)
Your physician has requested that you have an echocardiogram. Echocardiography is a painless test that uses sound waves to create images of your heart. It provides your doctor with information about the size and shape of your heart and how well your heart's chambers and valves are working. This procedure takes approximately one hour. There are no restrictions for this procedure. Office will contact with results via phone or letter.   Continue all current medications. Follow up in  4-6 weeks

## 2013-07-28 ENCOUNTER — Other Ambulatory Visit (INDEPENDENT_AMBULATORY_CARE_PROVIDER_SITE_OTHER): Payer: Medicare Other

## 2013-07-28 ENCOUNTER — Other Ambulatory Visit: Payer: Self-pay

## 2013-07-28 DIAGNOSIS — I359 Nonrheumatic aortic valve disorder, unspecified: Secondary | ICD-10-CM

## 2013-07-28 DIAGNOSIS — I350 Nonrheumatic aortic (valve) stenosis: Secondary | ICD-10-CM

## 2013-07-28 DIAGNOSIS — I351 Nonrheumatic aortic (valve) insufficiency: Secondary | ICD-10-CM

## 2013-07-28 DIAGNOSIS — I77810 Thoracic aortic ectasia: Secondary | ICD-10-CM

## 2013-08-01 ENCOUNTER — Telehealth: Payer: Self-pay | Admitting: Cardiovascular Disease

## 2013-08-01 NOTE — Telephone Encounter (Signed)
Notified patient that Dr. Bronson Ing will be back in office on 08/05/13 & results are on his desk top awaiting his review.  Will notify patient as soon as possible.  Also, reminded patient that he has follow up scheduled for 08/22/2013 with Dr. Bronson Ing.  MD will review test results at that visit with him in greater detail & patient can sign release if he would like copy of results at that time.  Patient verbalized understanding.

## 2013-08-01 NOTE — Telephone Encounter (Signed)
Requesting test results on his echo. Also, would like to sign and get a copy of results.

## 2013-08-11 ENCOUNTER — Encounter: Payer: Self-pay | Admitting: *Deleted

## 2013-08-22 ENCOUNTER — Ambulatory Visit (INDEPENDENT_AMBULATORY_CARE_PROVIDER_SITE_OTHER): Payer: Medicare Other | Admitting: Cardiovascular Disease

## 2013-08-22 ENCOUNTER — Encounter: Payer: Self-pay | Admitting: Cardiovascular Disease

## 2013-08-22 VITALS — BP 160/77 | HR 76 | Ht 71.5 in | Wt 158.0 lb

## 2013-08-22 DIAGNOSIS — I442 Atrioventricular block, complete: Secondary | ICD-10-CM

## 2013-08-22 DIAGNOSIS — I350 Nonrheumatic aortic (valve) stenosis: Secondary | ICD-10-CM

## 2013-08-22 DIAGNOSIS — Z950 Presence of cardiac pacemaker: Secondary | ICD-10-CM

## 2013-08-22 DIAGNOSIS — I2789 Other specified pulmonary heart diseases: Secondary | ICD-10-CM

## 2013-08-22 DIAGNOSIS — I10 Essential (primary) hypertension: Secondary | ICD-10-CM

## 2013-08-22 DIAGNOSIS — J438 Other emphysema: Secondary | ICD-10-CM

## 2013-08-22 DIAGNOSIS — I77810 Thoracic aortic ectasia: Secondary | ICD-10-CM

## 2013-08-22 DIAGNOSIS — I272 Pulmonary hypertension, unspecified: Secondary | ICD-10-CM

## 2013-08-22 DIAGNOSIS — I351 Nonrheumatic aortic (valve) insufficiency: Secondary | ICD-10-CM

## 2013-08-22 DIAGNOSIS — I359 Nonrheumatic aortic valve disorder, unspecified: Secondary | ICD-10-CM

## 2013-08-22 DIAGNOSIS — Z86718 Personal history of other venous thrombosis and embolism: Secondary | ICD-10-CM

## 2013-08-22 DIAGNOSIS — J439 Emphysema, unspecified: Secondary | ICD-10-CM

## 2013-08-22 MED ORDER — CARVEDILOL 12.5 MG PO TABS: 12.5000 mg | ORAL_TABLET | Freq: Two times a day (BID) | ORAL | Status: DC

## 2013-08-22 NOTE — Patient Instructions (Signed)
   Increase Coreg to 12.5mg  twice a day  (may take 2 tabs of your 6.25mg  tabs twice a day till finish current supply) - new sent to pharm Continue all current medications. Your physician wants you to follow up in: 6 months.  You will receive a reminder letter in the mail one-two months in advance.  If you don't receive a letter, please call our office to schedule the follow up appointment

## 2013-08-22 NOTE — Progress Notes (Signed)
Patient ID: [name] [name], male   DOB: 27-Oct-1927, 80 y.o.   MRN: [id number]      SUBJECTIVE: The patient is an 80 year old male with a history of a nonischemic cardiomyopathy and significant COPD, for which he uses albuterol, Pulmicort, and oxygen at home. In March 6967, his LV systolic function was found to be normal. He also has moderate pulmonary hypertension, mild aortic stenosis, moderate aortic regurgitation, and moderate aortic root dilatation. He also has a pacemaker.  When I had evaluated him earlier this month, he admitted to getting short of breath with any significant activity. He denied chest pain, palpitations, lightheadedness, leg swelling, orthopnea, and paroxysmal nocturnal dyspnea.   For this reason, I obtained an echocardiogram which revealed the following:  - Left ventricle: The cavity size was normal. Wall thickness was increased in a pattern of mild LVH. Systolic function was normal. The estimated ejection fraction was in the range of 50% to 55%. Wall motion was normal; there were no regional wall motion abnormalities. Findings consistent with left ventricular diastolic dysfunction, indeterminate grade. There is evidence of significantly elevated LA pressure (E/e' 25) - Aortic valve: Mildly calcified annulus. Trileaflet; moderately thickened leaflets. There was mild stenosis. Moderate regurgitation. AI PHT 470 ms. Valve area: 1.56cm^2(VTI). - Mitral valve: Calcified annulus. Mildly thickened leaflets . Mild regurgitation. - Left atrium: The atrium was mildly dilated. - Right ventricle: The cavity size was mildly to moderately dilated. - Right atrium: The atrium was mildly dilated. - Pulmonary arteries: Systolic pressure was moderately increased. PA peak pressure: 54mm Hg (S). - Systemic veins: Dilated IVC with normal respiratory variation, consistent with estimated RA pressure of 8 mmHg.   His blood pressure is elevated today. He feels a little sluggish. He  denies chest pain and palpitations.   Allergies  Allergen Reactions  . Lisinopril Swelling    Angioedema  . Aspirin Nausea And Vomiting    Current Outpatient Prescriptions  Medication Sig Dispense Refill  . albuterol (PROVENTIL HFA;VENTOLIN HFA) 108 (90 BASE) MCG/ACT inhaler Inhale 2 puffs into the lungs every 6 (six) hours as needed for wheezing or shortness of breath.      Marland Kitchen albuterol (PROVENTIL) (2.5 MG/3ML) 0.083% nebulizer solution Take 3 mLs (2.5 mg total) by nebulization 2 (two) times daily.  75 mL  6  . budesonide (PULMICORT) 0.25 MG/2ML nebulizer solution Take 2 mLs (0.25 mg total) by nebulization 2 (two) times daily.  120 mL  6  . carvedilol (COREG) 6.25 MG tablet Take 1 tablet (6.25 mg total) by mouth 2 (two) times daily with a meal.  60 tablet  6  . flunisolide (NASALIDE) 0.025 % SOLN Place 2 sprays into the nose as needed.       . furosemide (LASIX) 40 MG tablet Take 20 mg by mouth daily as needed. For excess fluid retention      . Multiple Vitamin (MULTIVITAMIN WITH MINERALS) TABS Take 1 tablet by mouth daily.      Marland Kitchen warfarin (COUMADIN) 5 MG tablet Take 7.5 mg tonight (12/01/11), 10mg  tomorrow (12/02/11) and 7.5mg  (12/03/11). Please have your INR checked by Wednesday the 10th for further dosing recommendations.       No current facility-administered medications for this visit.    Past Medical History  Diagnosis Date  . COPD (chronic obstructive pulmonary disease)     GOLD stage IV COPD on home oxygen;   . Pulmonary embolism     Small LLL sub-segmental PE seen on CT 11/2011, unchanged from CT in  May 2013; Chronic Coumadin Therapy  . Nonischemic cardiomyopathy     Mild-EF 45-50%  . CHB (complete heart block)     S/P PPM with CRT upgrade in 2008  . Tobacco abuse      Quit in the 1990s  . Hypercholesterolemia   . Peripheral neuropathy   . Hypertension   . Arthritis   . Allergic rhinitis   . Prostate cancer     s/p Prostatectomy 2008   . Pulmonary HTN     RHC 11/28/11  revealed minimally elevated PA pressures, nl wedge/cardiac output  . Aortic dissection     Localized ascending aortic dissection seen on CT 11/2011, unchanged from CT in May 2013, ? artifact    Past Surgical History  Procedure Laterality Date  . Prostatectomy    . Pacemaker insertion  09/15/06    St. Jude Upgraded (2008)    History   Social History  . Marital Status: Married    Spouse Name: N/A    Number of Children: N/A  . Years of Education: N/A   Occupational History  . 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 Smoking status: Former Smoker -- 0.50 packs/day for 52 years    Types: Cigarettes    Quit date: 05/26/1993  . Smokeless tobacco: Former Systems developer    Types: Chew    Quit date: 05/26/1990     Comment: chewed tobacco 40 years less than a pack/day  . Alcohol Use: No     Comment: denies alcohol use  . Drug Use: No  . Sexual Activity: Not on file   Other Topics Concern  . Not on file   Social History Narrative   Retired Chief Strategy Officer.  Lives with wife of 32 years.     Filed Vitals:   08/22/13 0843  BP: 160/77  Pulse: 76  Height: 5' 11.5" (1.816 m)  Weight: 158 lb (71.668 kg)  SpO2: 94%    PHYSICAL EXAM General: NAD  Neck: No JVD, no thyromegaly.  Lungs: Clear to auscultation bilaterally with normal respiratory effort.  CV: Nondisplaced PMI. Regular rate and rhythm, normal S1/S2, no S3/S4, III/VI late-peaking ejection systolic murmur best heard at RUSB, II/IV holodiastolic murmur best heard at left lower sternal border. No pretibial or periankle edema. No carotid bruit. Normal pedal pulses.  Abdomen: Soft, nontender, no hepatosplenomegaly, no distention.  Neurologic: Alert and oriented x 3.  Psych: Normal affect.  Extremities: No clubbing or cyanosis.    ECG: reviewed and available in electronic records.      ASSESSMENT AND PLAN:  Biventricular cardiac pacemaker St Jude  Followed by Dr. Rayann Heman. Recently evaluated and found to be  functioning appropriately.   Cardiomyopathy nonischemic  Patient has normal LV systolic function, as confirmed by echocardiogram in March 2015. He had normal coronary arteries, by cardiac catheterization in 2012.   History of pulmonary embolus  On chronic Coumadin anticoagulation, followed by primary M.D.   Valvular heart disease  In March 2015, it was demonstrated that his mild aortic stenosis has not progressed in severity, nor has his moderate aortic regurgitation with moderate pulmonary HTN.   Pulmonary hypertension  Assessed as moderate, by echocardiogram in 07/2013.   Aortic root dilatation  Moderate as per echo in March 2014. Normal in size by echo done in 07/2013. Continue to monitor.   Hypertension Will increase Coreg to 12.5 mg bid. I've asked him to contact our office next week to let me know if he is feeling better,  worse, or the same, with respect to his sluggishness.   Dispo: f/u 6 months.   Kate Sable, M.D., F.A.C.C.

## 2013-09-01 ENCOUNTER — Telehealth: Payer: Self-pay | Admitting: *Deleted

## 2013-09-01 NOTE — Telephone Encounter (Signed)
Last seen 08/22/2013 by Dr. Bronson Ing -   Hypertension Will increase Coreg to 12.5 mg bid. I've asked him to contact our office next week to let me know if he is feeling better, worse, or the same, with respect to his sluggishness.

## 2013-09-01 NOTE — Telephone Encounter (Signed)
Patient returned call.  Stated he is feeling better & does not feel as short of breath as before.  Advised patient to call with concerns prior to his follow up time of September 2015.  Will send message to MD for notification.

## 2013-09-01 NOTE — Telephone Encounter (Signed)
Left message to return call with wife °

## 2013-09-05 ENCOUNTER — Encounter: Payer: Self-pay | Admitting: Internal Medicine

## 2013-10-04 ENCOUNTER — Encounter: Payer: Self-pay | Admitting: Internal Medicine

## 2013-10-14 ENCOUNTER — Ambulatory Visit (INDEPENDENT_AMBULATORY_CARE_PROVIDER_SITE_OTHER): Payer: Medicare Other | Admitting: *Deleted

## 2013-10-14 DIAGNOSIS — I442 Atrioventricular block, complete: Secondary | ICD-10-CM

## 2013-10-14 LAB — MDC_IDC_ENUM_SESS_TYPE_INCLINIC
Brady Statistic RA Percent Paced: 97 %
Brady Statistic RV Percent Paced: 99 %
Lead Channel Impedance Value: 403 Ohm
Lead Channel Pacing Threshold Pulse Width: 0.5 ms
Lead Channel Pacing Threshold Pulse Width: 0.5 ms
Lead Channel Setting Pacing Amplitude: 2 V
Lead Channel Setting Pacing Amplitude: 2 V
Lead Channel Setting Pacing Amplitude: 2.5 V
Lead Channel Setting Sensing Sensitivity: 4 mV
MDC IDC MSMT BATTERY IMPEDANCE: 6000 Ohm
MDC IDC MSMT BATTERY REMAINING LONGEVITY: 6 mo
MDC IDC MSMT BATTERY VOLTAGE: 2.71 V
MDC IDC MSMT LEADCHNL LV PACING THRESHOLD AMPLITUDE: 0.75 V
MDC IDC MSMT LEADCHNL LV PACING THRESHOLD PULSEWIDTH: 0.5 ms
MDC IDC MSMT LEADCHNL RA IMPEDANCE VALUE: 310 Ohm
MDC IDC MSMT LEADCHNL RA PACING THRESHOLD AMPLITUDE: 0.25 V — AB
MDC IDC MSMT LEADCHNL RA SENSING INTR AMPL: 1.25 mV
MDC IDC MSMT LEADCHNL RV IMPEDANCE VALUE: 440 Ohm
MDC IDC MSMT LEADCHNL RV PACING THRESHOLD AMPLITUDE: 0.75 V
MDC IDC PG SERIAL: 1873447
MDC IDC SESS DTM: 20150522101234
MDC IDC SET LEADCHNL LV PACING PULSEWIDTH: 0.5 ms

## 2013-10-14 NOTE — Progress Notes (Signed)
Pacemaker check in clinic. Battery longevity 0.50 year. Normal device function. 369 mode switch episodes-- < 1% of time. ROV 11-18-13 @ 1500 with device clinic for battery check. Next billable in August.

## 2013-10-20 ENCOUNTER — Encounter: Payer: Self-pay | Admitting: Internal Medicine

## 2013-11-18 ENCOUNTER — Ambulatory Visit (INDEPENDENT_AMBULATORY_CARE_PROVIDER_SITE_OTHER): Payer: Medicare Other | Admitting: *Deleted

## 2013-11-18 DIAGNOSIS — I442 Atrioventricular block, complete: Secondary | ICD-10-CM

## 2013-11-18 LAB — MDC_IDC_ENUM_SESS_TYPE_INCLINIC
Implantable Pulse Generator Model: 5586
MDC IDC PG SERIAL: 1873447

## 2013-11-18 NOTE — Progress Notes (Signed)
Pacemaker check in clinic for battery. Battery longevity 0.50 years. ROV 12-23-13 @ 900.

## 2013-11-30 ENCOUNTER — Encounter: Payer: Self-pay | Admitting: Internal Medicine

## 2013-12-23 ENCOUNTER — Ambulatory Visit (INDEPENDENT_AMBULATORY_CARE_PROVIDER_SITE_OTHER): Payer: Medicare Other | Admitting: *Deleted

## 2013-12-23 DIAGNOSIS — I442 Atrioventricular block, complete: Secondary | ICD-10-CM

## 2013-12-23 LAB — MDC_IDC_ENUM_SESS_TYPE_INCLINIC
Brady Statistic RA Percent Paced: 97 %
Brady Statistic RV Percent Paced: 99 %
Implantable Pulse Generator Model: 5586
Implantable Pulse Generator Serial Number: 1873447

## 2013-12-23 NOTE — Progress Notes (Signed)
Pacemaker battery check only. Battery longevity 0.25 year. Normal device function. 369 mode switch episodes--longest was 3 minutes 58 seconds. + Warfarin. ROV in 1 mth for battery check and February with [facility].

## 2014-01-09 ENCOUNTER — Encounter: Payer: Self-pay | Admitting: Internal Medicine

## 2014-01-28 ENCOUNTER — Encounter: Payer: Self-pay | Admitting: Cardiovascular Disease

## 2014-01-31 ENCOUNTER — Encounter: Payer: Self-pay | Admitting: Cardiovascular Disease

## 2014-01-31 ENCOUNTER — Ambulatory Visit (INDEPENDENT_AMBULATORY_CARE_PROVIDER_SITE_OTHER): Payer: Medicare Other | Admitting: Cardiovascular Disease

## 2014-01-31 VITALS — BP 132/73 | HR 85 | Ht 71.5 in | Wt 157.0 lb

## 2014-01-31 DIAGNOSIS — Z9289 Personal history of other medical treatment: Secondary | ICD-10-CM

## 2014-01-31 DIAGNOSIS — I77810 Thoracic aortic ectasia: Secondary | ICD-10-CM

## 2014-01-31 DIAGNOSIS — Z86718 Personal history of other venous thrombosis and embolism: Secondary | ICD-10-CM

## 2014-01-31 DIAGNOSIS — Z87898 Personal history of other specified conditions: Secondary | ICD-10-CM

## 2014-01-31 DIAGNOSIS — I2789 Other specified pulmonary heart diseases: Secondary | ICD-10-CM

## 2014-01-31 DIAGNOSIS — I429 Cardiomyopathy, unspecified: Secondary | ICD-10-CM

## 2014-01-31 DIAGNOSIS — I38 Endocarditis, valve unspecified: Secondary | ICD-10-CM

## 2014-01-31 DIAGNOSIS — J438 Other emphysema: Secondary | ICD-10-CM

## 2014-01-31 DIAGNOSIS — I359 Nonrheumatic aortic valve disorder, unspecified: Secondary | ICD-10-CM

## 2014-01-31 DIAGNOSIS — J439 Emphysema, unspecified: Secondary | ICD-10-CM

## 2014-01-31 DIAGNOSIS — I350 Nonrheumatic aortic (valve) stenosis: Secondary | ICD-10-CM

## 2014-01-31 DIAGNOSIS — Z950 Presence of cardiac pacemaker: Secondary | ICD-10-CM

## 2014-01-31 DIAGNOSIS — I10 Essential (primary) hypertension: Secondary | ICD-10-CM

## 2014-01-31 DIAGNOSIS — Z86711 Personal history of pulmonary embolism: Secondary | ICD-10-CM

## 2014-01-31 DIAGNOSIS — I272 Pulmonary hypertension, unspecified: Secondary | ICD-10-CM

## 2014-01-31 DIAGNOSIS — I351 Nonrheumatic aortic (valve) insufficiency: Secondary | ICD-10-CM

## 2014-01-31 NOTE — Patient Instructions (Signed)
There were no changes to your medications. Continue as directed. Your physician wants you to follow up in: 6 months.  You will receive a reminder letter in the mail one-two months in advance.  If you don't receive a letter, please call our office to schedule the follow up appointment.

## 2014-01-31 NOTE — Progress Notes (Signed)
Patient ID: [name] [name], male   DOB: [birth year], 80 y.o.   MRN: [id number]      SUBJECTIVE: The patient is an 80 year old male with a history of a nonischemic cardiomyopathy and significant COPD, for which he uses albuterol, Pulmicort, and oxygen. In March 1696, his LV systolic function was found to be normal. He also has moderate pulmonary hypertension, mild aortic stenosis, moderate aortic regurgitation, and moderate aortic root dilatation. He also has a pacemaker.  He was hospitalized at [facility] for dizziness on September 5 and was discharged yesterday. I have the history of present illness but not the discharge summary he has been placed on a prednisone taper. He no longer complains of dizziness, and also denies chest pain, leg swelling, or worsening shortness of breath from baseline. He only uses oxygen at night.    Review of Systems: As per "subjective", otherwise negative.  Allergies  Allergen Reactions  . Lisinopril Swelling    Angioedema  . Aspirin Nausea And Vomiting    Current Outpatient Prescriptions  Medication Sig Dispense Refill  . albuterol (PROVENTIL HFA;VENTOLIN HFA) 108 (90 BASE) MCG/ACT inhaler Inhale 2 puffs into the lungs every 6 (six) hours as needed for wheezing or shortness of breath.      Marland Kitchen albuterol (PROVENTIL) (2.5 MG/3ML) 0.083% nebulizer solution Take 3 mLs (2.5 mg total) by nebulization 2 (two) times daily.  75 mL  6  . budesonide (PULMICORT) 0.25 MG/2ML nebulizer solution Take 2 mLs (0.25 mg total) by nebulization 2 (two) times daily.  120 mL  6  . carvedilol (COREG) 12.5 MG tablet Take 1 tablet (12.5 mg total) by mouth 2 (two) times daily with a meal.  60 tablet  6  . flunisolide (NASALIDE) 0.025 % SOLN Place 2 sprays into the nose as needed.       . furosemide (LASIX) 40 MG tablet Take 20 mg by mouth daily as needed. For excess fluid retention      . Multiple Vitamin (MULTIVITAMIN WITH MINERALS) TABS Take 1 tablet by mouth daily.      .  predniSONE (STERAPRED UNI-PAK) 5 MG TABS tablet Take by mouth.      . warfarin (COUMADIN) 5 MG tablet Take 7.5 mg tonight (12/01/11), 10mg  tomorrow (12/02/11) and 7.5mg  (12/03/11). Please have your INR checked by Wednesday the 10th for further dosing recommendations.       No current facility-administered medications for this visit.    Past Medical History  Diagnosis Date  . COPD (chronic obstructive pulmonary disease)     GOLD stage IV COPD on home oxygen;   . Pulmonary embolism     Small LLL sub-segmental PE seen on CT 11/2011, unchanged from CT in May 2013; Chronic Coumadin Therapy  . Nonischemic cardiomyopathy     Mild-EF 45-50%  . CHB (complete heart block)     S/P PPM with CRT upgrade in 2008  . Tobacco abuse      Quit in the 1990s  . Hypercholesterolemia   . Peripheral neuropathy   . Hypertension   . Arthritis   . Allergic rhinitis   . Prostate cancer     s/p Prostatectomy 2008   . Pulmonary HTN     RHC 11/28/11 revealed minimally elevated PA pressures, nl wedge/cardiac output  . Aortic dissection     Localized ascending aortic dissection seen on CT 11/2011, unchanged from CT in May 2013, ? artifact    Past Surgical History  Procedure Laterality Date  . Prostatectomy    .  Pacemaker insertion  09/15/06    St. Jude Upgraded (2008)    History   Social History  . Marital Status: Married    Spouse Name: N/A    Number of Children: N/A  . Years of Education: N/A   Occupational History  . 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 Smoking status: Former Smoker -- 0.50 packs/day for 52 years    Types: Cigarettes    Start date: 02/01/1938    Quit date: 05/26/1993  . Smokeless tobacco: Former Systems developer    Types: Chew    Quit date: 05/26/1990     Comment: chewed tobacco 40 years less than a pack/day  . Alcohol Use: No     Comment: denies alcohol use  . Drug Use: No  . Sexual Activity: Not on file   Other Topics Concern  . 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     Filed Vitals:   01/31/14 1041  BP: 132/73  Pulse: 85  Height: 5' 11.5" (1.816 m)  Weight: 157 lb (71.215 kg)  SpO2: 98%    PHYSICAL EXAM General: NAD  Neck: No JVD, no thyromegaly.  Lungs: Clear to auscultation bilaterally.  CV: Nondisplaced PMI. Regular rate and rhythm, normal S1/S2, no S3/S4, III/VI late-peaking ejection systolic murmur best heard at RUSB, II/IV holodiastolic murmur best heard at left lower sternal border. No pretibial or periankle edema. No carotid bruit. Normal pedal pulses.  Abdomen: Soft, nontender, no hepatosplenomegaly, no distention.  Neurologic: Alert and oriented x 3.  Psych: Normal affect.  Skin: Normal. Musculoskeletal: Normal range of motion, no gross deformities. Extremities: No clubbing or cyanosis.   ECG: Most recent ECG reviewed.      ASSESSMENT AND PLAN:  Biventricular cardiac pacemaker St Jude  Followed by Dr. Rayann Heman. Most recent device interrogation on 12/23/2013 demonstrated 369 mode switches with the longest lasting 3 minutes 58 seconds. He is on warfarin.   Cardiomyopathy nonischemic  Patient has normal LV systolic function, as confirmed by echocardiogram in March 2015. He had normal coronary arteries, by cardiac catheterization in 2012.   History of pulmonary embolus  On chronic warfarin for anticoagulation, followed by primary M.D.   Valvular heart disease  In March 2015, it was demonstrated that his mild aortic stenosis has not progressed in severity, nor has his moderate aortic regurgitation with moderate pulmonary HTN.   Pulmonary hypertension  Assessed as moderate, by echocardiogram in 07/2013.   Aortic root dilatation  Moderate as per echo in March 2014. Normal in size by echo done in 07/2013. Continue to monitor.   Hypertension Controlled today. Continue Coreg 12.5 mg bid.  Dispo: f/u 6 months.    Kate Sable, M.D., F.A.C.C.

## 2014-02-09 ENCOUNTER — Ambulatory Visit (INDEPENDENT_AMBULATORY_CARE_PROVIDER_SITE_OTHER): Payer: Medicare Other | Admitting: *Deleted

## 2014-02-09 DIAGNOSIS — I442 Atrioventricular block, complete: Secondary | ICD-10-CM

## 2014-02-09 LAB — MDC_IDC_ENUM_SESS_TYPE_INCLINIC
Date Time Interrogation Session: 20150917134921
Implantable Pulse Generator Serial Number: 1873447
Lead Channel Impedance Value: 415 Ohm
Lead Channel Pacing Threshold Amplitude: 0.75 V
Lead Channel Pacing Threshold Pulse Width: 0.5 ms
Lead Channel Pacing Threshold Pulse Width: 0.5 ms
Lead Channel Setting Pacing Amplitude: 2 V
Lead Channel Setting Pacing Amplitude: 2 V
Lead Channel Setting Pacing Amplitude: 2.5 V
Lead Channel Setting Pacing Pulse Width: 0.5 ms
Lead Channel Setting Sensing Sensitivity: 4 mV
MDC IDC MSMT BATTERY IMPEDANCE: 11900 Ohm
MDC IDC MSMT BATTERY REMAINING LONGEVITY: 0 mo
MDC IDC MSMT BATTERY VOLTAGE: 2.6 V
MDC IDC MSMT LEADCHNL RA IMPEDANCE VALUE: 306 Ohm
MDC IDC MSMT LEADCHNL RA PACING THRESHOLD AMPLITUDE: 0.25 V — AB
MDC IDC MSMT LEADCHNL RA PACING THRESHOLD PULSEWIDTH: 0.5 ms
MDC IDC MSMT LEADCHNL RA SENSING INTR AMPL: 1 mV
MDC IDC MSMT LEADCHNL RV IMPEDANCE VALUE: 432 Ohm
MDC IDC MSMT LEADCHNL RV PACING THRESHOLD AMPLITUDE: 0.75 V
MDC IDC STAT BRADY RA PERCENT PACED: 97 %
MDC IDC STAT BRADY RV PERCENT PACED: 99 %

## 2014-02-09 NOTE — Progress Notes (Signed)
Pacemaker check in clinic. Battery longevity 0 year. Normal device function. 369 mode switch episodes. + Warfarin. Pt scheduled to with [facility] on 02-10-14 @ 1045 to schedule gen change. Pt is dependent.

## 2014-02-10 ENCOUNTER — Other Ambulatory Visit: Payer: Self-pay | Admitting: Internal Medicine

## 2014-02-10 ENCOUNTER — Encounter: Payer: Self-pay | Admitting: *Deleted

## 2014-02-10 ENCOUNTER — Ambulatory Visit (INDEPENDENT_AMBULATORY_CARE_PROVIDER_SITE_OTHER): Payer: Medicare Other | Admitting: Internal Medicine

## 2014-02-10 ENCOUNTER — Other Ambulatory Visit: Payer: Self-pay | Admitting: *Deleted

## 2014-02-10 ENCOUNTER — Encounter: Payer: Self-pay | Admitting: Internal Medicine

## 2014-02-10 ENCOUNTER — Telehealth: Payer: Self-pay | Admitting: Internal Medicine

## 2014-02-10 VITALS — BP 130/68 | HR 70 | Ht 71.0 in | Wt 159.0 lb

## 2014-02-10 DIAGNOSIS — Z950 Presence of cardiac pacemaker: Secondary | ICD-10-CM

## 2014-02-10 DIAGNOSIS — R0602 Shortness of breath: Secondary | ICD-10-CM

## 2014-02-10 DIAGNOSIS — J432 Centrilobular emphysema: Secondary | ICD-10-CM

## 2014-02-10 DIAGNOSIS — Z45018 Encounter for adjustment and management of other part of cardiac pacemaker: Secondary | ICD-10-CM

## 2014-02-10 DIAGNOSIS — J438 Other emphysema: Secondary | ICD-10-CM

## 2014-02-10 DIAGNOSIS — I429 Cardiomyopathy, unspecified: Secondary | ICD-10-CM

## 2014-02-10 DIAGNOSIS — I442 Atrioventricular block, complete: Secondary | ICD-10-CM

## 2014-02-10 NOTE — Progress Notes (Signed)
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  Since last being seen in our clinic, the patient reports doing very well.  He has stable SOB.  He has severe COPD and is followed by Dr Halford Chessman for this.  Today, he denies symptoms of palpitations, chest pain, lower extremity edema, dizziness, presyncope, or syncope.  The patient is otherwise without complaint today.  His pacemaker was checked yesterday and was found to have reached ERI status by pacing rate.  As he is device dependant, he is scheduled to see me today for consideration of generator change.  Past Medical History  Diagnosis Date  . COPD (chronic obstructive pulmonary disease)     GOLD stage IV COPD on home oxygen;   . Pulmonary embolism     Small LLL sub-segmental PE seen on CT 11/2011, unchanged from CT in May 2013; Chronic Coumadin Therapy  . Nonischemic cardiomyopathy     Mild-EF 45-50%  . CHB (complete heart block)     S/P PPM with CRT upgrade in 2008  . Tobacco abuse      Quit in the 1990s  . Hypercholesterolemia   . Peripheral neuropathy   . Hypertension   . Arthritis   . Allergic rhinitis   . Prostate cancer     s/p Prostatectomy 2008   . Pulmonary HTN     RHC 11/28/11 revealed minimally elevated PA pressures, nl wedge/cardiac output  . Aortic dissection     Localized ascending aortic dissection seen on CT 11/2011, unchanged from CT in May 2013, ? artifact   Past Surgical History  Procedure Laterality Date  . Prostatectomy    . Pacemaker insertion  09/15/06    St. Jude Upgraded (2008)    Current Outpatient Prescriptions  Medication Sig Dispense Refill  . albuterol (PROVENTIL HFA;VENTOLIN HFA) 108 (90 BASE) MCG/ACT inhaler Inhale 2 puffs into the lungs every 6 (six) hours as needed for wheezing or shortness of breath.      Marland Kitchen albuterol (PROVENTIL) (2.5 MG/3ML) 0.083% nebulizer solution Take 3 mLs (2.5 mg total) by  nebulization 2 (two) times daily.  75 mL  6  . budesonide (PULMICORT) 0.25 MG/2ML nebulizer solution Take 2 mLs (0.25 mg total) by nebulization 2 (two) times daily.  120 mL  6  . carvedilol (COREG) 12.5 MG tablet Take 1 tablet (12.5 mg total) by mouth 2 (two) times daily with a meal.  60 tablet  6  . flunisolide (NASALIDE) 0.025 % SOLN Place 2 sprays into the nose as needed.       . furosemide (LASIX) 40 MG tablet Take 20 mg by mouth daily as needed. For excess fluid retention      . Multiple Vitamin (MULTIVITAMIN WITH MINERALS) TABS Take 1 tablet by mouth daily.      Marland Kitchen warfarin (COUMADIN) 5 MG tablet Take 7.5 mg tonight (12/01/11), 10mg  tomorrow (12/02/11) and 7.5mg  (12/03/11). Please have your INR checked by Wednesday the 10th for further dosing recommendations.       No current facility-administered medications for this visit.    Physical Exam: Filed Vitals:   02/10/14 1046  BP: 130/68  Pulse: 70  Height: 5\' 11"  (1.803 m)  Weight: 159 lb (72.122 kg)  SpO2: 95%    GEN- The patient is well appearing, alert and oriented x 3 today.   Head- normocephalic, atraumatic Eyes-  Sclera clear, conjunctiva pink Ears- hearing intact Oropharynx- clear  Lungs- Coarse BS at the bases, poor expiratory movement, normal work of breathing Chest- pacemaker pocket is well healed Heart- Regular rate and rhythm, no murmurs, rubs or gallops, PMI not laterally displaced GI- soft, NT, ND, + BS Extremities- no clubbing, cyanosis, or edema  Pacemaker interrogation- reviewed in detail today,  See PACEART report  Assessment and Plan:  1. Complete heart block Normal biV pacemaker function though he has reached ERI by battery heart rate See Claudia Desanctis Art report No changes today Risks, benefits, and alteratives to PPM generator change were discussed with the patient and his spouse who wishes to proceed.  We will schedule the procedure for Tuesday at 12:30.  2. Nonischemic CM/ Chronic systolic dysfunction Stable No  changes today Recent Echo reveals improved EF with BiV pacing.  He does not meet criteria for ICD.  3. Hypertensive cardiovascular disease with CHF Slightly elevated Salt restriction advised  He will continue to follow up with Dr Bronson Ing as scheduled

## 2014-02-10 NOTE — Telephone Encounter (Signed)
Pacemaker Generator change on Tuesday, February 14, 2014 with Dr. Rayann Heman at [facility] @12 :30 pm Checking percert

## 2014-02-10 NOTE — Patient Instructions (Addendum)
   You are scheduled for device change out on Tuesday, February 14, 2014 @12 :30 pm.

## 2014-02-10 NOTE — Telephone Encounter (Signed)
No precert required 

## 2014-02-13 ENCOUNTER — Encounter (HOSPITAL_COMMUNITY): Payer: Self-pay | Admitting: Pharmacy Technician

## 2014-02-13 MED ORDER — CEFAZOLIN SODIUM-DEXTROSE 2-3 GM-% IV SOLR: 2.0000 g | INTRAVENOUS | Status: DC

## 2014-02-13 MED ORDER — SODIUM CHLORIDE 0.9 % IR SOLN
80.0000 mg | Status: DC
  Filled 2014-02-13: qty 2

## 2014-02-14 ENCOUNTER — Ambulatory Visit (HOSPITAL_COMMUNITY)
Admission: RE | Admit: 2014-02-14 | Discharge: 2014-02-14 | Disposition: A | Discharge status: Home / Self Care | Payer: Medicare Other | Source: Ambulatory Visit | Attending: Internal Medicine | Admitting: Internal Medicine

## 2014-02-14 ENCOUNTER — Encounter (HOSPITAL_COMMUNITY)
Admission: RE | Disposition: A | Discharge status: Home / Self Care | Payer: Medicare Other | Source: Ambulatory Visit | Attending: Internal Medicine

## 2014-02-14 DIAGNOSIS — Z7901 Long term (current) use of anticoagulants: Secondary | ICD-10-CM | POA: Insufficient documentation

## 2014-02-14 DIAGNOSIS — M129 Arthropathy, unspecified: Secondary | ICD-10-CM | POA: Insufficient documentation

## 2014-02-14 DIAGNOSIS — I428 Other cardiomyopathies: Secondary | ICD-10-CM | POA: Diagnosis not present

## 2014-02-14 DIAGNOSIS — Z87891 Personal history of nicotine dependence: Secondary | ICD-10-CM | POA: Diagnosis not present

## 2014-02-14 DIAGNOSIS — Z86711 Personal history of pulmonary embolism: Secondary | ICD-10-CM | POA: Insufficient documentation

## 2014-02-14 DIAGNOSIS — I110 Hypertensive heart disease with heart failure: Secondary | ICD-10-CM | POA: Insufficient documentation

## 2014-02-14 DIAGNOSIS — Z8546 Personal history of malignant neoplasm of prostate: Secondary | ICD-10-CM | POA: Diagnosis not present

## 2014-02-14 DIAGNOSIS — J4489 Other specified chronic obstructive pulmonary disease: Secondary | ICD-10-CM | POA: Insufficient documentation

## 2014-02-14 DIAGNOSIS — Z45018 Encounter for adjustment and management of other part of cardiac pacemaker: Secondary | ICD-10-CM

## 2014-02-14 DIAGNOSIS — I509 Heart failure, unspecified: Secondary | ICD-10-CM | POA: Insufficient documentation

## 2014-02-14 DIAGNOSIS — Z9981 Dependence on supplemental oxygen: Secondary | ICD-10-CM | POA: Diagnosis not present

## 2014-02-14 DIAGNOSIS — Z4502 Encounter for adjustment and management of automatic implantable cardiac defibrillator: Secondary | ICD-10-CM | POA: Insufficient documentation

## 2014-02-14 DIAGNOSIS — J449 Chronic obstructive pulmonary disease, unspecified: Secondary | ICD-10-CM | POA: Insufficient documentation

## 2014-02-14 DIAGNOSIS — G609 Hereditary and idiopathic neuropathy, unspecified: Secondary | ICD-10-CM | POA: Diagnosis not present

## 2014-02-14 DIAGNOSIS — I5022 Chronic systolic (congestive) heart failure: Secondary | ICD-10-CM | POA: Diagnosis not present

## 2014-02-14 DIAGNOSIS — Z950 Presence of cardiac pacemaker: Secondary | ICD-10-CM

## 2014-02-14 DIAGNOSIS — I442 Atrioventricular block, complete: Secondary | ICD-10-CM

## 2014-02-14 HISTORY — PX: PERMANENT PACEMAKER GENERATOR CHANGE: SHX6022

## 2014-02-14 LAB — BASIC METABOLIC PANEL
ANION GAP: 8 (ref 5–15)
BUN: 16 mg/dL (ref 6–23)
CHLORIDE: 105 meq/L (ref 96–112)
CO2: 30 mEq/L (ref 19–32)
Calcium: 9.2 mg/dL (ref 8.4–10.5)
Creatinine, Ser: 1.1 mg/dL (ref 0.50–1.35)
GFR, EST AFRICAN AMERICAN: 69 mL/min — AB (ref >90)
GFR, EST NON AFRICAN AMERICAN: 60 mL/min — AB (ref >90)
Glucose, Bld: 76 mg/dL (ref 70–99)
POTASSIUM: 4.3 meq/L (ref 3.7–5.3)
SODIUM: 143 meq/L (ref 137–147)

## 2014-02-14 LAB — CBC
HCT: 37.6 % — ABNORMAL LOW (ref 39.0–52.0)
HEMOGLOBIN: 11.9 g/dL — AB (ref 13.0–17.0)
MCH: 25.8 pg — ABNORMAL LOW (ref 26.0–34.0)
MCHC: 31.6 g/dL (ref 30.0–36.0)
MCV: 81.4 fL (ref 78.0–100.0)
PLATELETS: 155 10*3/uL (ref 150–400)
RBC: 4.62 MIL/uL (ref 4.22–5.81)
RDW: 15.9 % — ABNORMAL HIGH (ref 11.5–15.5)
WBC: 5.8 10*3/uL (ref 4.0–10.5)

## 2014-02-14 LAB — PROTIME-INR
INR: 1.81 — ABNORMAL HIGH (ref 0.00–1.49)
PROTHROMBIN TIME: 21 s — AB (ref 11.6–15.2)

## 2014-02-14 LAB — SURGICAL PCR SCREEN
MRSA, PCR: NEGATIVE
Staphylococcus aureus: NEGATIVE

## 2014-02-14 SURGERY — PERMANENT PACEMAKER GENERATOR CHANGE
Anesthesia: LOCAL

## 2014-02-14 MED ORDER — LIDOCAINE HCL (PF) 1 % IJ SOLN
INTRAMUSCULAR | Status: AC
  Filled 2014-02-14: qty 60

## 2014-02-14 MED ORDER — SODIUM CHLORIDE 0.9 % IJ SOLN
3.0000 mL | Freq: Two times a day (BID) | INTRAMUSCULAR | Status: DC
Start: 2014-02-14 — End: 2014-02-14

## 2014-02-14 MED ORDER — SODIUM CHLORIDE 0.9 % IJ SOLN: 3.0000 mL | INTRAMUSCULAR | Status: DC | PRN

## 2014-02-14 MED ORDER — SODIUM CHLORIDE 0.9 % IV SOLN
INTRAVENOUS | Status: DC
  Administered 2014-02-14: 12:00:00 via INTRAVENOUS

## 2014-02-14 MED ORDER — MUPIROCIN 2 % EX OINT
1.0000 "application " | TOPICAL_OINTMENT | Freq: Once | CUTANEOUS | Status: DC
  Filled 2014-02-14: qty 22

## 2014-02-14 MED ORDER — MUPIROCIN 2 % EX OINT
TOPICAL_OINTMENT | CUTANEOUS | Status: AC
  Administered 2014-02-14: 11:00:00
  Filled 2014-02-14: qty 22

## 2014-02-14 MED ORDER — SODIUM CHLORIDE 0.9 % IV SOLN: 250.0000 mL | INTRAVENOUS | Status: DC | PRN

## 2014-02-14 MED ORDER — CHLORHEXIDINE GLUCONATE 4 % EX LIQD
60.0000 mL | Freq: Once | CUTANEOUS | Status: DC
  Filled 2014-02-14: qty 60

## 2014-02-14 MED ORDER — ONDANSETRON HCL 4 MG/2ML IJ SOLN: 4.0000 mg | Freq: Four times a day (QID) | INTRAMUSCULAR | Status: DC | PRN

## 2014-02-14 MED ORDER — HYDROCODONE-ACETAMINOPHEN 5-325 MG PO TABS: 1.0000 | ORAL_TABLET | ORAL | Status: DC | PRN

## 2014-02-14 MED ORDER — ACETAMINOPHEN 325 MG PO TABS
325.0000 mg | ORAL_TABLET | ORAL | Status: DC | PRN
  Filled 2014-02-14: qty 2

## 2014-02-14 NOTE — Discharge Instructions (Signed)
Pacemaker Battery Change, Care After °Refer to this sheet in the next few weeks. These instructions provide you with information on caring for yourself after your procedure. Your health care provider may also give you more specific instructions. Your treatment has been planned according to current medical practices, but problems sometimes occur. Call your health care provider if you have any problems or questions after your procedure. °WHAT TO EXPECT AFTER THE PROCEDURE °After your procedure, it is typical to have the following sensations: °· Soreness at the pacemaker site. °HOME CARE INSTRUCTIONS  °· Keep the incision clean and dry. °· Unless advised otherwise, you may shower beginning 48 hours after your procedure. °· For the first week after the replacement, avoid stretching motions that pull at the incision site, and avoid heavy exercise with the arm that is on the same side as the incision. °· Take medicines only as directed by your health care provider. °· Keep all follow-up visits as directed by your health care provider. °SEEK MEDICAL CARE IF:  °· You have pain at the incision site that is not relieved by over-the-counter or prescription medicine. °· There is drainage or pus from the incision site. °· There is swelling larger than a lime at the incision site. °· You develop red streaking that extends above or below the incision site. °· You feel brief, intermittent palpitations, light-headedness, or any symptoms that you feel might be related to your heart. °SEEK IMMEDIATE MEDICAL CARE IF:  °· You experience chest pain that is different than the pain at the pacemaker site. °· You experience shortness of breath. °· You have palpitations or irregular heartbeat. °· You have light-headedness that does not go away quickly. °· You faint. °· You have pain that gets worse and is not relieved by medicine. °Document Released: 03/02/2013 Document Revised: 09/26/2013 Document Reviewed: 03/02/2013 °[employer]® Patient  Information ©2015 [employer]. This information is not intended to replace advice given to you by your health care provider. Make sure you discuss any questions you have with your health care provider. ° °

## 2014-02-14 NOTE — Progress Notes (Signed)
12-lead EKG done. IV saline locked

## 2014-02-14 NOTE — H&P (View-Only) (Signed)
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  Since last being seen in our clinic, the patient reports doing very well.  He has stable SOB.  He has severe COPD and is followed by Dr Halford Chessman for this.  Today, he denies symptoms of palpitations, chest pain, lower extremity edema, dizziness, presyncope, or syncope.  The patient is otherwise without complaint today.  His pacemaker was checked yesterday and was found to have reached ERI status by pacing rate.  As he is device dependant, he is scheduled to see me today for consideration of generator change.  Past Medical History  Diagnosis Date  . COPD (chronic obstructive pulmonary disease)     GOLD stage IV COPD on home oxygen;   . Pulmonary embolism     Small LLL sub-segmental PE seen on CT 11/2011, unchanged from CT in May 2013; Chronic Coumadin Therapy  . Nonischemic cardiomyopathy     Mild-EF 45-50%  . CHB (complete heart block)     S/P PPM with CRT upgrade in 2008  . Tobacco abuse      Quit in the 1990s  . Hypercholesterolemia   . Peripheral neuropathy   . Hypertension   . Arthritis   . Allergic rhinitis   . Prostate cancer     s/p Prostatectomy 2008   . Pulmonary HTN     RHC 11/28/11 revealed minimally elevated PA pressures, nl wedge/cardiac output  . Aortic dissection     Localized ascending aortic dissection seen on CT 11/2011, unchanged from CT in May 2013, ? artifact   Past Surgical History  Procedure Laterality Date  . Prostatectomy    . Pacemaker insertion  09/15/06    St. Jude Upgraded (2008)    Current Outpatient Prescriptions  Medication Sig Dispense Refill  . albuterol (PROVENTIL HFA;VENTOLIN HFA) 108 (90 BASE) MCG/ACT inhaler Inhale 2 puffs into the lungs every 6 (six) hours as needed for wheezing or shortness of breath.      Marland Kitchen albuterol (PROVENTIL) (2.5 MG/3ML) 0.083% nebulizer solution Take 3 mLs (2.5 mg total) by  nebulization 2 (two) times daily.  75 mL  6  . budesonide (PULMICORT) 0.25 MG/2ML nebulizer solution Take 2 mLs (0.25 mg total) by nebulization 2 (two) times daily.  120 mL  6  . carvedilol (COREG) 12.5 MG tablet Take 1 tablet (12.5 mg total) by mouth 2 (two) times daily with a meal.  60 tablet  6  . flunisolide (NASALIDE) 0.025 % SOLN Place 2 sprays into the nose as needed.       . furosemide (LASIX) 40 MG tablet Take 20 mg by mouth daily as needed. For excess fluid retention      . Multiple Vitamin (MULTIVITAMIN WITH MINERALS) TABS Take 1 tablet by mouth daily.      Marland Kitchen warfarin (COUMADIN) 5 MG tablet Take 7.5 mg tonight (12/01/11), 10mg  tomorrow (12/02/11) and 7.5mg  (12/03/11). Please have your INR checked by Wednesday the 10th for further dosing recommendations.       No current facility-administered medications for this visit.    Physical Exam: Filed Vitals:   02/10/14 1046  BP: 130/68  Pulse: 70  Height: 5\' 11"  (1.803 m)  Weight: 159 lb (72.122 kg)  SpO2: 95%    GEN- The patient is well appearing, alert and oriented x 3 today.   Head- normocephalic, atraumatic Eyes-  Sclera clear, conjunctiva pink Ears- hearing intact Oropharynx- clear  Lungs- Coarse BS at the bases, poor expiratory movement, normal work of breathing Chest- pacemaker pocket is well healed Heart- Regular rate and rhythm, no murmurs, rubs or gallops, PMI not laterally displaced GI- soft, NT, ND, + BS Extremities- no clubbing, cyanosis, or edema  Pacemaker interrogation- reviewed in detail today,  See PACEART report  Assessment and Plan:  1. Complete heart block Normal biV pacemaker function though he has reached ERI by battery heart rate See Claudia Desanctis Art report No changes today Risks, benefits, and alteratives to PPM generator change were discussed with the patient and his spouse who wishes to proceed.  We will schedule the procedure for Tuesday at 12:30.  2. Nonischemic CM/ Chronic systolic dysfunction Stable No  changes today Recent Echo reveals improved EF with BiV pacing.  He does not meet criteria for ICD.  3. Hypertensive cardiovascular disease with CHF Slightly elevated Salt restriction advised  He will continue to follow up with Dr Bronson Ing as scheduled

## 2014-02-14 NOTE — Op Note (Signed)
SURGEON:  [name], MD     PREPROCEDURE DIAGNOSES:   1. Complete heart block.   2. Chronic systolic dysfunction with good response to CRT   3. Elective Replacement Indicator     POSTPROCEDURE DIAGNOSES:   1. Complete heart block.   2. Chronic systolic dysfunction with good response to CRT   3. Elective Replacement Indicator     PROCEDURES:   1. Biventricular pacemaker pulse generator replacement.   2. Skin pocket revision.      INTRODUCTION:  [name] [name] is a 80 y.o. male with a history of complete heart block and chronic systolic dysfunction. The patient has done well since his BiV pacemaker was implanted.  He has had good response to CRT with normalization of his EF.  Recent device check revealed ERI by heart rate response of his pacemaker.  The patient presents today for pacemaker pulse generator replacement.       DESCRIPTION OF THE PROCEDURE:  Informed written consent was obtained, and the patient was brought to the electrophysiology lab in the fasting state.  The patient's pacemaker was interrogated today and found to be at elective replacement indicator battery status.  The patient required no sedation for the procedure today.  The patient's left chest was prepped and draped in the usual sterile fashion by the EP lab staff.  The skin overlying the existing pacemaker was infiltrated with lidocaine for local analgesia.  A 4-cm incision was made over the pacemaker pocket.  Using a combination of sharp and blunt dissection, the pacemaker was exposed and removed from the body.  The device was disconnected from the leads.  There was no foreign matter or debris within the pocket.  The atrial lead was confirmed to be a SJM model 1388TC- 52 (serial number [id number]) lead implanted on 07-27-1997.  The right ventricular lead was confirmed to be a Medtronic model [id number] (serial number [id number]) lead implanted on the same date as the atrial lead (above).  The left ventricular lead was confirmed to be  a Medtronic model [id number] (serial number [id number]) implanted on 09/15/2006 The leads were examined and their integrity was confirmed to be intact.  Atrial lead P-waves measured 0.8 mV with impedance of 310 ohms and a threshold of 0.625 V at 0.5 msec.  Right ventricular lead R-waves were not present due to complete heart block.  The RV lead impedance was 490 ohms with a threshold of 0.5 V at 0.5 msec. The LV lead impedance was 390 ohms (unipolar lead) with a threshold of 0.75 V at 0.5 msec.   All three leads were connected to a [facility] RF model [id number] (serial number [id number]) pacemaker.  The pocket was revised to accommodate this new device.  Electrocautery was required to assure hemostasis.  The pocket was irrigated with copious gentamicin solution. The pacemaker was then placed into the pocket.  The pocket was then closed in 2 layers with 2-0 Vicryl suture over the subcutaneous and subcuticular layers.  Steri-Strips and a sterile dressing were then applied.EBL<46ml. There were no early apparent complications.     CONCLUSIONS:   1. Successful BiV pacemaker pulse generator replacement for complete heart block  2. No early apparent complications.     [name], MD 02/14/2014 2:47 PM

## 2014-02-14 NOTE — Interval H&P Note (Signed)
History and Physical Interval Note:  02/14/2014 1:28 PM  [name] [name]  has presented today for surgery, with the diagnosis of eol  The various methods of treatment have been discussed with the patient and family. After consideration of risks, benefits and other options for treatment, the patient has consented to  Procedure(s): PERMANENT PACEMAKER GENERATOR CHANGE (N/A) as a surgical intervention .  The patient's history has been reviewed, patient examined, no change in status, stable for surgery.  I have reviewed the patient's chart and labs.  Questions were answered to the patient's satisfaction.     [name]

## 2014-02-16 ENCOUNTER — Telehealth: Payer: Self-pay | Admitting: *Deleted

## 2014-02-16 ENCOUNTER — Emergency Department (HOSPITAL_COMMUNITY)
Admission: EM | Admit: 2014-02-16 | Discharge: 2014-02-16 | Disposition: A | Discharge status: Home / Self Care | Payer: Medicare Other | Attending: Emergency Medicine | Admitting: Emergency Medicine

## 2014-02-16 ENCOUNTER — Emergency Department (HOSPITAL_COMMUNITY): Payer: Medicare Other

## 2014-02-16 ENCOUNTER — Encounter (HOSPITAL_COMMUNITY): Payer: Self-pay | Admitting: Emergency Medicine

## 2014-02-16 DIAGNOSIS — Z8546 Personal history of malignant neoplasm of prostate: Secondary | ICD-10-CM | POA: Insufficient documentation

## 2014-02-16 DIAGNOSIS — Z79899 Other long term (current) drug therapy: Secondary | ICD-10-CM | POA: Diagnosis not present

## 2014-02-16 DIAGNOSIS — J4489 Other specified chronic obstructive pulmonary disease: Secondary | ICD-10-CM | POA: Insufficient documentation

## 2014-02-16 DIAGNOSIS — Z862 Personal history of diseases of the blood and blood-forming organs and certain disorders involving the immune mechanism: Secondary | ICD-10-CM | POA: Diagnosis not present

## 2014-02-16 DIAGNOSIS — F17200 Nicotine dependence, unspecified, uncomplicated: Secondary | ICD-10-CM | POA: Insufficient documentation

## 2014-02-16 DIAGNOSIS — Z8639 Personal history of other endocrine, nutritional and metabolic disease: Secondary | ICD-10-CM | POA: Diagnosis not present

## 2014-02-16 DIAGNOSIS — Z950 Presence of cardiac pacemaker: Secondary | ICD-10-CM | POA: Diagnosis not present

## 2014-02-16 DIAGNOSIS — M129 Arthropathy, unspecified: Secondary | ICD-10-CM | POA: Insufficient documentation

## 2014-02-16 DIAGNOSIS — G609 Hereditary and idiopathic neuropathy, unspecified: Secondary | ICD-10-CM | POA: Insufficient documentation

## 2014-02-16 DIAGNOSIS — Z9981 Dependence on supplemental oxygen: Secondary | ICD-10-CM | POA: Diagnosis not present

## 2014-02-16 DIAGNOSIS — I10 Essential (primary) hypertension: Secondary | ICD-10-CM | POA: Insufficient documentation

## 2014-02-16 DIAGNOSIS — R011 Cardiac murmur, unspecified: Secondary | ICD-10-CM | POA: Insufficient documentation

## 2014-02-16 DIAGNOSIS — R079 Chest pain, unspecified: Secondary | ICD-10-CM | POA: Insufficient documentation

## 2014-02-16 DIAGNOSIS — Z7901 Long term (current) use of anticoagulants: Secondary | ICD-10-CM | POA: Diagnosis not present

## 2014-02-16 DIAGNOSIS — L7682 Other postprocedural complications of skin and subcutaneous tissue: Secondary | ICD-10-CM

## 2014-02-16 DIAGNOSIS — IMO0002 Reserved for concepts with insufficient information to code with codable children: Secondary | ICD-10-CM | POA: Diagnosis not present

## 2014-02-16 DIAGNOSIS — R209 Unspecified disturbances of skin sensation: Secondary | ICD-10-CM

## 2014-02-16 DIAGNOSIS — Z86711 Personal history of pulmonary embolism: Secondary | ICD-10-CM | POA: Insufficient documentation

## 2014-02-16 DIAGNOSIS — J449 Chronic obstructive pulmonary disease, unspecified: Secondary | ICD-10-CM | POA: Diagnosis not present

## 2014-02-16 LAB — PROTIME-INR
INR: 1.7 — AB (ref 0.00–1.49)
Prothrombin Time: 20 seconds — ABNORMAL HIGH (ref 11.6–15.2)

## 2014-02-16 LAB — CBC
HCT: 36 % — ABNORMAL LOW (ref 39.0–52.0)
Hemoglobin: 11.4 g/dL — ABNORMAL LOW (ref 13.0–17.0)
MCH: 26 pg (ref 26.0–34.0)
MCHC: 31.7 g/dL (ref 30.0–36.0)
MCV: 82.2 fL (ref 78.0–100.0)
PLATELETS: 140 10*3/uL — AB (ref 150–400)
RBC: 4.38 MIL/uL (ref 4.22–5.81)
RDW: 16 % — AB (ref 11.5–15.5)
WBC: 5.6 10*3/uL (ref 4.0–10.5)

## 2014-02-16 LAB — I-STAT TROPONIN, ED: TROPONIN I, POC: 0.02 ng/mL (ref 0.00–0.08)

## 2014-02-16 LAB — BASIC METABOLIC PANEL
ANION GAP: 12 (ref 5–15)
BUN: 19 mg/dL (ref 6–23)
CO2: 27 mEq/L (ref 19–32)
CREATININE: 1.18 mg/dL (ref 0.50–1.35)
Calcium: 8.9 mg/dL (ref 8.4–10.5)
Chloride: 109 mEq/L (ref 96–112)
GFR calc non Af Amer: 55 mL/min — ABNORMAL LOW (ref >90)
GFR, EST AFRICAN AMERICAN: 63 mL/min — AB (ref >90)
Glucose, Bld: 59 mg/dL — ABNORMAL LOW (ref 70–99)
POTASSIUM: 4.2 meq/L (ref 3.7–5.3)
Sodium: 148 mEq/L — ABNORMAL HIGH (ref 137–147)

## 2014-02-16 MED ORDER — TRAMADOL HCL 50 MG PO TABS: 50.0000 mg | ORAL_TABLET | Freq: Four times a day (QID) | ORAL | Status: AC | PRN

## 2014-02-16 MED ORDER — TRAMADOL HCL 50 MG PO TABS: 50.0000 mg | ORAL_TABLET | Freq: Four times a day (QID) | ORAL | Status: DC | PRN

## 2014-02-16 NOTE — Telephone Encounter (Signed)
Patient called with c/o chest pain.  Stated he just had gen change on 02/14/2014.  Advised to go to [facility] ED for evaluation in light of most recent procedure.  Patient verbalized understanding.

## 2014-02-16 NOTE — ED Provider Notes (Signed)
Patient presents to the emergency complaints of soreness in the left side of his chest with a recently placed a new pacemaker on Tuesday. He denies any fevers or chills. Denies any redness Physical Exam  BP 115/63  Pulse 75  Temp(Src) 97.4 F (36.3 C) (Oral)  Resp 18  SpO2 100%  Physical Exam  Nursing note and vitals reviewed. Constitutional: He appears well-developed and well-nourished. No distress.  HENT:  Head: Normocephalic and atraumatic.  Right Ear: External ear normal.  Left Ear: External ear normal.  Eyes: Conjunctivae are normal. Right eye exhibits no discharge. Left eye exhibits no discharge. No scleral icterus.  Neck: Neck supple. No tracheal deviation present.  Cardiovascular: Normal rate.   Pulmonary/Chest: Effort normal. No stridor. No respiratory distress.  Left chest wall pacemaker site with mild erythema but no edema, mild soreness to palpation  Musculoskeletal: He exhibits no edema.  Neurological: He is alert. Cranial nerve deficit: no gross deficits.  Skin: Skin is warm and dry. No rash noted.  Psychiatric: He has a normal mood and affect.    ED Course  Procedures Medical screening examination/treatment/procedure(s) were conducted as a shared visit with non-physician practitioner(s) and myself.  I personally evaluated the patient during the encounter.   EKG Interpretation  Date/Time:  Thursday February 16 2014 12:01:23 EDT Ventricular Rate:  74 PR Interval:  99 QRS Duration: 171 QT Interval:  485 QTC Calculation: 538 R Axis:   -97 Text Interpretation:  Paced rhythm Right bundle branch block Abnrm T, consider ischemia, anterolateral lds No significant change since EKG earlier today Reconfirmed by Teryn Gust  MD-J, Emmily Pellegrin ([id number]) on 02/16/2014 12:56:08 PM        MDM The doesn't appear to be a pocket infection based on exam. The patient describes an intermittent sharp discomfort. He is concerned that the pacemaker is not functioning properly. EKG does show some  T-wave changes.  Pt called his cardiologist and was told to come to the emergency department. We'll consult with them while he is here.      Dorie Rank, MD 02/16/14 1256

## 2014-02-16 NOTE — ED Notes (Signed)
St. Jude pacemaker interrogated by Agricultural consultant

## 2014-02-16 NOTE — ED Notes (Signed)
Phlebotomy notified unsuccessful at obtaining all labs, enroute to attempt redraw

## 2014-02-16 NOTE — ED Notes (Signed)
Spoke with [employer]. Jude representative, No abnormal cardiac activity noted on interrogation, pacemaker is functioning perfectly.

## 2014-02-16 NOTE — Consult Note (Signed)
Reason for Consult: Chest Pain  Referring Physician: [facility]    HPI: The patient is 80 year old male followed by Dr. Burke Keels. He is also followed in our electrophysiology clinic by Dr. Rayann Heman. He has a history of severe COPD, pulmonary embolism in 2013 on chronic warfarin therapy, non-nonischemic cardiomyopathy, complete heart block status post St. Jude pacemaker insertion with upgrade to ICD in 2008, hyperlipidemia and hypertension. He has had a good response to CRT with normalization of his EF. He has a history of normal coronary arteries by left heart catheterization in May of 2012. He also has mild to moderate aortic insufficiency and mild aortic stenosis as well as moderate pulmonary hypertension.  He was recently seen by Dr. Rayann Heman in clinic on 02/10/2014 for routine EP followup. Pacemaker interrogation revealed that he had reached ERI status. Subsequently, he underwent a generator change by Dr. Rayann Heman on 02/14/2014. He was discharged home the same day. Wound check followup was scheduled for October 9. Per patient, no pain medications were prescribed.  He presents to the [facility] ER today with complaints of left-sided chest pain near his incision site. This has been ongoing since his procedure 2 days ago. It is described as a localized sharp pain. Occurs intermittently and worse with movement. Also exacerbated by palpation over the incision site. He denies any radiation to the chest, neck, jaw, back or left lower extremity. No chest pressure, heaviness or tightness. He denies any associated dyspnea, syncope/ near syncope. No fevers, chills, nausea or vomiting. As instructed, he has not removed his Steri-Strips therefore cannot comment on any purulent drainage.    Past Medical History  Diagnosis Date  . COPD (chronic obstructive pulmonary disease)     GOLD stage IV COPD on home oxygen;   . Pulmonary embolism     Small LLL sub-segmental PE seen on CT 11/2011, unchanged from CT in May  2013; Chronic Coumadin Therapy  . Nonischemic cardiomyopathy     Mild-EF 45-50%  . CHB (complete heart block)     S/P PPM with CRT upgrade in 2008  . Tobacco abuse      Quit in the 1990s  . Hypercholesterolemia   . Peripheral neuropathy   . Hypertension   . Arthritis   . Allergic rhinitis   . Prostate cancer     s/p Prostatectomy 2008   . Pulmonary HTN     RHC 11/28/11 revealed minimally elevated PA pressures, nl wedge/cardiac output  . Aortic dissection     Localized ascending aortic dissection seen on CT 11/2011, unchanged from CT in May 2013, ? artifact    Past Surgical History  Procedure Laterality Date  . Prostatectomy    . Pacemaker insertion  09/15/06    St. Jude Upgraded (2008)    Family History  Problem Relation Age of Onset  . Hypertension Mother   . Heart failure Father   . Stroke    . Asthma      Social History:  reports that he quit smoking about 20 years ago. His smoking use included Cigarettes. He started smoking about 76 years ago. He has a 26 pack-year smoking history. He quit smokeless tobacco use about 23 years ago. His smokeless tobacco use included Chew. He reports that he does not drink alcohol or use illicit drugs.  Allergies:  Allergies  Allergen Reactions  . Lisinopril Swelling    Angioedema  . Aspirin Nausea And Vomiting    Medications: Prior to Admission medications   Medication Sig Start  Date End Date Taking? Authorizing Provider  albuterol (PROVENTIL HFA;VENTOLIN HFA) 108 (90 BASE) MCG/ACT inhaler Inhale 2 puffs into the lungs every 6 (six) hours as needed for wheezing or shortness of breath. 11/10/11 06/08/14 Yes Chesley Mires, MD  albuterol (PROVENTIL) (2.5 MG/3ML) 0.083% nebulizer solution Take 3 mLs (2.5 mg total) by nebulization 2 (two) times daily. 04/18/13  Yes Chesley Mires, MD  budesonide (PULMICORT) 0.25 MG/2ML nebulizer solution Take 2 mLs (0.25 mg total) by nebulization 2 (two) times daily. 04/18/13 05/27/14 Yes Chesley Mires, MD   carvedilol (COREG) 12.5 MG tablet Take 1 tablet (12.5 mg total) by mouth 2 (two) times daily with a meal. 08/22/13  Yes Herminio Commons, MD  flunisolide (NASALIDE) 0.025 % SOLN Place 2 sprays into the nose as needed (allergies).    Yes Historical Provider, MD  furosemide (LASIX) 20 MG tablet Take 20 mg by mouth daily as needed for fluid.   Yes Historical Provider, MD  Multiple Vitamin (MULTIVITAMIN WITH MINERALS) TABS Take 1 tablet by mouth daily.   Yes Historical Provider, MD  warfarin (COUMADIN) 5 MG tablet Take 5-7.5 mg by mouth daily. Alternate between taking 30m and 7.564mdaily   Yes Historical Provider, MD     Results for orders placed during the hospital encounter of 02/16/14 (from the past 48 hour(s))  I-STAT TROPOININ, ED     Status: None   Collection Time    02/16/14 11:38 AM      Result Value Ref Range   Troponin i, poc 0.02  0.00 - 0.08 ng/mL   Comment 3            Comment: Due to the release kinetics of cTnI,     a negative result within the first hours     of the onset of symptoms does not rule out     myocardial infarction with certainty.     If myocardial infarction is still suspected,     repeat the test at appropriate intervals.  CBC     Status: Abnormal   Collection Time    02/16/14 11:52 AM      Result Value Ref Range   WBC 5.6  4.0 - 10.5 K/uL   RBC 4.38  4.22 - 5.81 MIL/uL   Hemoglobin 11.4 (*) 13.0 - 17.0 g/dL   HCT 36.0 (*) 39.0 - 52.0 %   MCV 82.2  78.0 - 100.0 fL   MCH 26.0  26.0 - 34.0 pg   MCHC 31.7  30.0 - 36.0 g/dL   RDW 16.0 (*) 11.5 - 15.5 %   Platelets 140 (*) 150 - 400 K/uL  BASIC METABOLIC PANEL     Status: Abnormal   Collection Time    02/16/14 11:52 AM      Result Value Ref Range   Sodium 148 (*) 137 - 147 mEq/L   Potassium 4.2  3.7 - 5.3 mEq/L   Chloride 109  96 - 112 mEq/L   CO2 27  19 - 32 mEq/L   Glucose, Bld 59 (*) 70 - 99 mg/dL   BUN 19  6 - 23 mg/dL   Creatinine, Ser 1.18  0.50 - 1.35 mg/dL   Calcium 8.9  8.4 - 10.5 mg/dL    GFR calc non Af Amer 55 (*) >90 mL/min   GFR calc Af Amer 63 (*) >90 mL/min   Comment: (NOTE)     The eGFR has been calculated using the CKD EPI equation.     This calculation has  not been validated in all clinical situations.     eGFR's persistently <90 mL/min signify possible Chronic Kidney     Disease.   Anion gap 12  5 - 15  PROTIME-INR     Status: Abnormal   Collection Time    02/16/14 11:52 AM      Result Value Ref Range   Prothrombin Time 20.0 (*) 11.6 - 15.2 seconds   INR 1.70 (*) 0.00 - 1.49    Dg Chest 2 View  02/16/2014   CLINICAL DATA:  Chest pain; history of COPD, tobacco use, aortic dissection and pulmonary embolism, and pulmonary hypertension  EXAM: CHEST  2 VIEW  COMPARISON:  Portable chest x-ray of January 28, 2014  FINDINGS: The lungs are hyperinflated with hemidiaphragm flattening. There is no focal infiltrate. There is no pleural effusion or pneumothorax. The cardiac silhouette is normal in size. The pulmonary vascularity exhibits mild stable prominence centrally with fairly rapid tapering peripherally. There is tortuosity of the descending thoracic aorta. The permanent pacemaker is in appropriate position radiographically. The bony thorax exhibits no acute abnormalities.  IMPRESSION: 1. COPD without evidence of pneumonia. 2. There is no evidence of CHF. The pulmonary arterial configuration is consistent with pulmonary hypertension. 3. There is no pneumothorax or pleural effusion.   Electronically Signed   By: David  Martinique   On: 02/16/2014 10:59    Review of Systems  Constitutional: Negative for fever, chills, malaise/fatigue and diaphoresis.  Respiratory: Negative for shortness of breath.   Cardiovascular: Positive for chest pain (localized over her incision site.). Negative for orthopnea, leg swelling and PND.  Neurological: Negative for dizziness and loss of consciousness.  All other systems reviewed and are negative.  Blood pressure 140/57, pulse 72,  temperature 97.4 F (36.3 C), temperature source Oral, resp. rate 23, SpO2 99.00%. Physical Exam  Constitutional: He is oriented to person, place, and time. He appears well-developed and well-nourished. No distress.  Neck: No JVD present. Carotid bruit is not present.  Cardiovascular: Normal rate, regular rhythm and intact distal pulses.  Exam reveals no gallop and no friction rub.   Murmur (1/6 systolic murmur heard throughout the precordium) heard. Respiratory: Effort normal. No respiratory distress. He has no wheezes. He has no rales. He exhibits tenderness (left-upper chest along incision site/ gen pocket).  Musculoskeletal: He exhibits no edema.  Neurological: He is alert and oriented to person, place, and time.  Skin: Skin is warm and dry. He is not diaphoretic.  Psychiatric: He has a normal mood and affect. His behavior is normal.    Assessment/Plan: Principal Problem:   Incisional pain-left chest wall surrounding PPM generator Active Problems:   Biventricular cardiac pacemaker  St Judes- Day 2 s/p Gen change  1. Incisional pain: Patient is with intermittent sharp pain surrounding the generator pocket also with moderate tenderness with palpation surrounding the overlying skin. Steri-Strips remain intact with minimal drainage. There is mild surrounding erythema but no significant swelling. He denies any history of fevers and chills. White count is within normal limits and he is afebrile. Chest x-ray is unremarkable. Pacemaker interrogation reveals normal functioning. His EKG does reveal lateral T wave abnormalities compared to prior EKGs, however he denies any anginal symptoms. Point of care troponin is negative. I don't believe he will require admission. Recommend prescribing PRN pain meds. Keep followup appointment for wound check in [place].    SIMMONS, BRITTAINY 02/16/2014, 4:07 PM     I have seen and examined the patient along with SIMMONS, BRITTAINY,  PA.  I have reviewed the chart,  notes and new data.  I agree with PA's note.  Key new complaints: Pain at the surgical site comes and goes and is not particularly severe right now Key examination changes: The surgical site appears very healthy. There is no evidence of swelling, excessive erythema or discharge or pocket hematoma. In fact the site looks quite healthy for a 2 day wound. Key new findings / data: Normal device function  PLAN: I think he can be safely discharged home with oral analgesics. He already has appropriate followup arranged.  Sanda Klein, MD, [place] [phone] 02/16/2014, 6:10 PM

## 2014-02-16 NOTE — ED Provider Notes (Signed)
CSN: [id number]     Arrival date & time 02/16/14  1012 History   First MD Initiated Contact with Patient 02/16/14 1114     Chief Complaint  Patient presents with  . Chest Pain     (Consider location/radiation/quality/duration/timing/severity/associated sxs/prior Treatment) HPI Comments: Patient reports sharp, intermittent left-sided chest pain that does not radiate since his pacemaker replacement one week ago.  Pain occurs for a few seconds every few hours.  He cannot think of anything that makes the pain worse or better.  He denies any associated dyspnea, fevers, chills, cough, diaphoresis, arm pain/numbness.  Patient is a 80 y.o. male presenting with chest pain. The history is provided by the patient.  Chest Pain Pain location:  L chest Pain quality: sharp   Pain radiates to:  Does not radiate Pain radiates to the back: no   Pain severity:  Severe Onset quality:  Sudden Duration:  1 week Timing:  Intermittent Progression:  Waxing and waning Chronicity:  New Context: at rest   Context: not breathing, not lifting, no movement and no trauma   Relieved by:  None tried Worsened by:  Nothing tried Ineffective treatments:  None tried Associated symptoms: no abdominal pain, no anxiety, no back pain, no cough, no diaphoresis, no dizziness, no fever, no lower extremity edema, no nausea, no numbness, no palpitations, no shortness of breath, no syncope, not vomiting and no weakness   Risk factors: aortic disease, coronary artery disease, hypertension, prior DVT/PE and surgery     Past Medical History  Diagnosis Date  . COPD (chronic obstructive pulmonary disease)     GOLD stage IV COPD on home oxygen;   . Pulmonary embolism     Small LLL sub-segmental PE seen on CT 11/2011, unchanged from CT in May 2013; Chronic Coumadin Therapy  . Nonischemic cardiomyopathy     Mild-EF 45-50%  . CHB (complete heart block)     S/P PPM with CRT upgrade in 2008  . Tobacco abuse      Quit in the 1990s   . Hypercholesterolemia   . Peripheral neuropathy   . Hypertension   . Arthritis   . Allergic rhinitis   . Prostate cancer     s/p Prostatectomy 2008   . Pulmonary HTN     RHC 11/28/11 revealed minimally elevated PA pressures, nl wedge/cardiac output  . Aortic dissection     Localized ascending aortic dissection seen on CT 11/2011, unchanged from CT in May 2013, ? artifact   Past Surgical History  Procedure Laterality Date  . Prostatectomy    . Pacemaker insertion  09/15/06    St. Jude Upgraded (2008)   Family History  Problem Relation Age of Onset  . Hypertension Mother   . Heart failure Father   . Stroke    . Asthma     History  Substance Use Topics  . Smoking status: Former Smoker -- 0.50 packs/day for 52 years    Types: Cigarettes    Start date: [birth year]01/1936    Quit date: 05/26/1993  . Smokeless tobacco: Former Systems developer    Types: Chew    Quit date: 05/26/1990     Comment: chewed tobacco 40 years less than a pack/day  . Alcohol Use: No     Comment: denies alcohol use    Review of Systems  Constitutional: Negative for fever, chills and diaphoresis.  HENT: Negative.   Respiratory: Negative for cough, chest tightness and shortness of breath.   Cardiovascular: Positive for chest pain. Negative  for palpitations, leg swelling and syncope.  Gastrointestinal: Negative for nausea, vomiting and abdominal pain.  Musculoskeletal: Negative for back pain.  Neurological: Negative for dizziness, weakness and numbness.      Allergies  Lisinopril and Aspirin  Home Medications   Prior to Admission medications   Medication Sig Start Date End Date Taking? Authorizing Provider  albuterol (PROVENTIL HFA;VENTOLIN HFA) 108 (90 BASE) MCG/ACT inhaler Inhale 2 puffs into the lungs every 6 (six) hours as needed for wheezing or shortness of breath. 11/10/11 06/08/14 Yes Chesley Mires, MD  albuterol (PROVENTIL) (2.5 MG/3ML) 0.083% nebulizer solution Take 3 mLs (2.5 mg total) by nebulization 2 (two)  times daily. 04/18/13  Yes Chesley Mires, MD  budesonide (PULMICORT) 0.25 MG/2ML nebulizer solution Take 2 mLs (0.25 mg total) by nebulization 2 (two) times daily. 04/18/13 05/27/14 Yes Chesley Mires, MD  carvedilol (COREG) 12.5 MG tablet Take 1 tablet (12.5 mg total) by mouth 2 (two) times daily with a meal. 08/22/13  Yes Herminio Commons, MD  flunisolide (NASALIDE) 0.025 % SOLN Place 2 sprays into the nose as needed (allergies).    Yes Historical Provider, MD  furosemide (LASIX) 20 MG tablet Take 20 mg by mouth daily as needed for fluid.   Yes Historical Provider, MD  Multiple Vitamin (MULTIVITAMIN WITH MINERALS) TABS Take 1 tablet by mouth daily.   Yes Historical Provider, MD  warfarin (COUMADIN) 5 MG tablet Take 5-7.5 mg by mouth daily. Alternate between taking 5mg  and 7.5mg  daily   Yes Historical Provider, MD  traMADol (ULTRAM) 50 MG tablet Take 1 tablet (50 mg total) by mouth every 6 (six) hours as needed. 02/16/14   Olam Idler, MD   BP 144/68  Pulse 74  Temp(Src) 97.9 F (36.6 C) (Oral)  Resp 30  SpO2 100% Physical Exam  Constitutional: He is oriented to person, place, and time. He appears well-developed. No distress.  HENT:  Mouth/Throat: Oropharynx is clear and moist.  Eyes: EOM are normal. Pupils are equal, round, and reactive to light.  Cardiovascular: Normal rate, regular rhythm and intact distal pulses.   Murmur heard. No LE edema  Pulmonary/Chest: Effort normal and breath sounds normal. No respiratory distress. He has no wheezes. He has no rales.  Abdominal: Soft. There is no tenderness.  Musculoskeletal: He exhibits no edema.  Neurological: He is alert and oriented to person, place, and time.  Skin: Skin is warm. No rash noted. He is not diaphoretic.    ED Course  Procedures (including critical care time) Labs Review Labs Reviewed  CBC - Abnormal; Notable for the following:    Hemoglobin 11.4 (*)    HCT 36.0 (*)    RDW 16.0 (*)    Platelets 140 (*)    All other  components within normal limits  BASIC METABOLIC PANEL - Abnormal; Notable for the following:    Sodium 148 (*)    Glucose, Bld 59 (*)    GFR calc non Af Amer 55 (*)    GFR calc Af Amer 63 (*)    All other components within normal limits  PROTIME-INR - Abnormal; Notable for the following:    Prothrombin Time 20.0 (*)    INR 1.70 (*)    All other components within normal limits  I-STAT TROPOININ, ED    Imaging Review Dg Chest 2 View  02/16/2014   CLINICAL DATA:  Chest pain; history of COPD, tobacco use, aortic dissection and pulmonary embolism, and pulmonary hypertension  EXAM: CHEST  2 VIEW  COMPARISON:  Portable chest x-ray of January 28, 2014  FINDINGS: The lungs are hyperinflated with hemidiaphragm flattening. There is no focal infiltrate. There is no pleural effusion or pneumothorax. The cardiac silhouette is normal in size. The pulmonary vascularity exhibits mild stable prominence centrally with fairly rapid tapering peripherally. There is tortuosity of the descending thoracic aorta. The permanent pacemaker is in appropriate position radiographically. The bony thorax exhibits no acute abnormalities.  IMPRESSION: 1. COPD without evidence of pneumonia. 2. There is no evidence of CHF. The pulmonary arterial configuration is consistent with pulmonary hypertension. 3. There is no pneumothorax or pleural effusion.   Electronically Signed   By: David  Martinique   On: 02/16/2014 10:59     EKG Interpretation   Date/Time:  Thursday February 16 2014 12:01:23 EDT Ventricular Rate:  74 PR Interval:  99 QRS Duration: 171 QT Interval:  485 QTC Calculation: 538 R Axis:   -97 Text Interpretation:  Paced rhythm Right bundle branch block Abnrm T,  consider ischemia, anterolateral lds No significant change since EKG  earlier today Reconfirmed by KNAPP  MD-J, JON ([id number]) on 02/16/2014  12:56:08 PM      MDM   Final diagnoses:  Biventricular cardiac pacemaker  St Judes  Chest pain, unspecified  chest pain type   Patient present with intermittent chest pain ~ 1 week after pacemake replaced. EKG showed RBBB but no significant change, Troponin negative. Cardiology consulted and cleared to followed as outpatient as previously scheduled. Ultram 50mg  given for pain. VSS remained stable in ED without further episodes of pain.     Olam Idler, MD 02/16/14 [phone]

## 2014-02-16 NOTE — ED Notes (Signed)
Pt c/o left sided CP and soreness where new pacemaker was placed on Tuesday; pt sts replacement of old pacemaker

## 2014-02-16 NOTE — Discharge Instructions (Signed)

## 2014-02-23 ENCOUNTER — Encounter: Payer: Self-pay | Admitting: Internal Medicine

## 2014-02-25 ENCOUNTER — Encounter (HOSPITAL_COMMUNITY): Payer: Self-pay | Admitting: *Deleted

## 2014-03-03 ENCOUNTER — Encounter: Payer: Self-pay | Admitting: Internal Medicine

## 2014-03-03 ENCOUNTER — Ambulatory Visit (INDEPENDENT_AMBULATORY_CARE_PROVIDER_SITE_OTHER): Payer: Medicare Other | Admitting: *Deleted

## 2014-03-03 ENCOUNTER — Telehealth: Payer: Self-pay | Admitting: *Deleted

## 2014-03-03 DIAGNOSIS — I442 Atrioventricular block, complete: Secondary | ICD-10-CM

## 2014-03-03 LAB — MDC_IDC_ENUM_SESS_TYPE_INCLINIC
Brady Statistic RA Percent Paced: 96 %
Date Time Interrogation Session: 20151009131620
Implantable Pulse Generator Serial Number: 7633185
Lead Channel Impedance Value: 425 Ohm
Lead Channel Impedance Value: 425 Ohm
Lead Channel Pacing Threshold Amplitude: 0.625 V
Lead Channel Pacing Threshold Amplitude: 0.75 V
Lead Channel Pacing Threshold Pulse Width: 0.5 ms
Lead Channel Pacing Threshold Pulse Width: 0.5 ms
Lead Channel Setting Pacing Amplitude: 1.625
Lead Channel Setting Pacing Amplitude: 2 V
Lead Channel Setting Pacing Amplitude: 2.5 V
Lead Channel Setting Pacing Pulse Width: 0.5 ms
Lead Channel Setting Pacing Pulse Width: 0.5 ms
Lead Channel Setting Sensing Sensitivity: 8 mV
MDC IDC MSMT BATTERY REMAINING LONGEVITY: 70.8 mo
MDC IDC MSMT BATTERY VOLTAGE: 3.01 V
MDC IDC MSMT LEADCHNL LV PACING THRESHOLD AMPLITUDE: 0.75 V
MDC IDC MSMT LEADCHNL RA IMPEDANCE VALUE: 300 Ohm
MDC IDC MSMT LEADCHNL RA PACING THRESHOLD PULSEWIDTH: 0.5 ms
MDC IDC MSMT LEADCHNL RA SENSING INTR AMPL: 1.6 mV
MDC IDC STAT BRADY RV PERCENT PACED: 99.87 %

## 2014-03-03 NOTE — Telephone Encounter (Signed)
Patient in office for device interrogation.  Per Erasmo Downer, device functioning normally.   Patient c/o increased SOB, more since last OV.  No c/o weight gain, chest pain, or dizziness.  Last seen by his Pulmonologist (Dr. Halford Chessman) in January.   Informed patient that message will be sent to provider for advice.

## 2014-03-03 NOTE — Progress Notes (Signed)
Wound check ppm in clinic. Normal device function. Battery longevity 5.9 to 6.5 years. 6 mode switch episodes--longest was 20 seconds. No changes made. ROV in 3 mths w/JA in [place].

## 2014-03-06 NOTE — Telephone Encounter (Signed)
Left message to return call 

## 2014-03-06 NOTE — Telephone Encounter (Signed)
Would have him f/u with pulmonary. LV systolic function is normal and he has known COPD with consequent pulmonary hypertension, and likely related to this.

## 2014-03-06 NOTE — Telephone Encounter (Signed)
Patient notified

## 2014-04-03 ENCOUNTER — Other Ambulatory Visit: Payer: Self-pay | Admitting: Cardiovascular Disease

## 2014-04-18 ENCOUNTER — Emergency Department (HOSPITAL_COMMUNITY)
Admission: EM | Admit: 2014-04-18 | Discharge: 2014-04-18 | Discharge status: Against Medical Advice | Payer: No Typology Code available for payment source | Attending: Emergency Medicine | Admitting: Emergency Medicine

## 2014-04-18 ENCOUNTER — Encounter (HOSPITAL_COMMUNITY): Payer: Self-pay | Admitting: Emergency Medicine

## 2014-04-18 ENCOUNTER — Ambulatory Visit (INDEPENDENT_AMBULATORY_CARE_PROVIDER_SITE_OTHER): Payer: Medicare Other | Admitting: Pulmonary Disease

## 2014-04-18 ENCOUNTER — Encounter: Payer: Self-pay | Admitting: Pulmonary Disease

## 2014-04-18 ENCOUNTER — Emergency Department (HOSPITAL_COMMUNITY): Payer: No Typology Code available for payment source

## 2014-04-18 VITALS — BP 130/70 | HR 100 | Temp 98.7°F | Ht 71.0 in | Wt 160.8 lb

## 2014-04-18 DIAGNOSIS — Y92410 Unspecified street and highway as the place of occurrence of the external cause: Secondary | ICD-10-CM | POA: Diagnosis not present

## 2014-04-18 DIAGNOSIS — J9611 Chronic respiratory failure with hypoxia: Secondary | ICD-10-CM | POA: Diagnosis not present

## 2014-04-18 DIAGNOSIS — Z87891 Personal history of nicotine dependence: Secondary | ICD-10-CM | POA: Insufficient documentation

## 2014-04-18 DIAGNOSIS — Z79899 Other long term (current) drug therapy: Secondary | ICD-10-CM | POA: Diagnosis not present

## 2014-04-18 DIAGNOSIS — G9009 Other idiopathic peripheral autonomic neuropathy: Secondary | ICD-10-CM | POA: Insufficient documentation

## 2014-04-18 DIAGNOSIS — Y998 Other external cause status: Secondary | ICD-10-CM | POA: Diagnosis not present

## 2014-04-18 DIAGNOSIS — Y9389 Activity, other specified: Secondary | ICD-10-CM | POA: Diagnosis not present

## 2014-04-18 DIAGNOSIS — Z8546 Personal history of malignant neoplasm of prostate: Secondary | ICD-10-CM | POA: Insufficient documentation

## 2014-04-18 DIAGNOSIS — M1990 Unspecified osteoarthritis, unspecified site: Secondary | ICD-10-CM | POA: Diagnosis not present

## 2014-04-18 DIAGNOSIS — M545 Low back pain: Secondary | ICD-10-CM

## 2014-04-18 DIAGNOSIS — J449 Chronic obstructive pulmonary disease, unspecified: Secondary | ICD-10-CM | POA: Diagnosis not present

## 2014-04-18 DIAGNOSIS — R9389 Abnormal findings on diagnostic imaging of other specified body structures: Secondary | ICD-10-CM

## 2014-04-18 DIAGNOSIS — J439 Emphysema, unspecified: Secondary | ICD-10-CM

## 2014-04-18 DIAGNOSIS — Z7901 Long term (current) use of anticoagulants: Secondary | ICD-10-CM | POA: Diagnosis not present

## 2014-04-18 DIAGNOSIS — R937 Abnormal findings on diagnostic imaging of other parts of musculoskeletal system: Secondary | ICD-10-CM | POA: Insufficient documentation

## 2014-04-18 DIAGNOSIS — Z8639 Personal history of other endocrine, nutritional and metabolic disease: Secondary | ICD-10-CM | POA: Insufficient documentation

## 2014-04-18 DIAGNOSIS — Z950 Presence of cardiac pacemaker: Secondary | ICD-10-CM | POA: Insufficient documentation

## 2014-04-18 DIAGNOSIS — Z7951 Long term (current) use of inhaled steroids: Secondary | ICD-10-CM | POA: Insufficient documentation

## 2014-04-18 DIAGNOSIS — S29002A Unspecified injury of muscle and tendon of back wall of thorax, initial encounter: Secondary | ICD-10-CM | POA: Diagnosis not present

## 2014-04-18 DIAGNOSIS — Z86711 Personal history of pulmonary embolism: Secondary | ICD-10-CM | POA: Diagnosis not present

## 2014-04-18 DIAGNOSIS — I10 Essential (primary) hypertension: Secondary | ICD-10-CM | POA: Insufficient documentation

## 2014-04-18 DIAGNOSIS — Z9981 Dependence on supplemental oxygen: Secondary | ICD-10-CM | POA: Diagnosis not present

## 2014-04-18 HISTORY — DX: Dyspnea, unspecified: R06.00

## 2014-04-18 HISTORY — DX: Chronic respiratory failure with hypoxia: J96.11

## 2014-04-18 LAB — PROTIME-INR
INR: 2.51 — AB (ref 0.00–1.49)
Prothrombin Time: 27.3 seconds — ABNORMAL HIGH (ref 11.6–15.2)

## 2014-04-18 NOTE — Discharge Instructions (Signed)
°Emergency Department Resource Guide °1) Find a Doctor and Pay Out of Pocket °Although you won't have to find out who is covered by your insurance plan, it is a good idea to ask around and get recommendations. You will then need to call the office and see if the doctor you have chosen will accept you as a new patient and what types of options they offer for patients who are self-pay. Some doctors offer discounts or will set up payment plans for their patients who do not have insurance, but you will need to ask so you aren't surprised when you get to your appointment. ° °2) Contact Your Local Health Department °Not all health departments have doctors that can see patients for sick visits, but many do, so it is worth a call to see if yours does. If you don't know where your local health department is, you can check in your phone book. The CDC also has a tool to help you locate your state's health department, and many state websites also have listings of all of their local health departments. ° °3) Find a Walk-in Clinic °If your illness is not likely to be very severe or complicated, you may want to try a walk in clinic. These are popping up all over the country in pharmacies, drugstores, and shopping centers. They're usually staffed by nurse practitioners or physician assistants that have been trained to treat common illnesses and complaints. They're usually fairly quick and inexpensive. However, if you have serious medical issues or chronic medical problems, these are probably not your best option. ° °No Primary Care Doctor: °- Call Health Connect at  832-8000 - they can help you locate a primary care doctor that  accepts your insurance, provides certain services, etc. °- Physician Referral Service- 1-800-533-3463 ° °Chronic Pain Problems: °Organization         Address  Phone   Notes  °[facility]  ([phone]) [phone] Patients need to be referred by their primary care doctor.  ° °Medication  Assistance: °Organization         Address  Phone   Notes  °[place] [facility] [address] °[place], [place] [address] (336) 641-8030 --Must be a resident of [place] °-- Must have NO insurance coverage whatsoever (no Medicaid/ Medicare, etc.) °-- The pt. MUST have a primary care doctor that directs their care regularly and follows them in the community °  °[facility]  (866) 331-1348   °[employer]  (888) 892-1162   ° °Agencies that provide inexpensive medical care: °Organization         Address  Phone   Notes  °[facility]  (336) 832-8035   °[facility]    (336) 832-7272   °[facility] [address] °[place], [place] [address] (336) 832-4777   °[facility] [address], °[place] (336) 271-4999   °[facility]    (336) 373-0678   °[facility]    (336) 272-1050   °[facility] ° [address], [place] Phone:  (336) 832-4444, Fax:  (336) 832-4440 Hours of Operation:  9 am - 6 pm, M-F.  Also accepts Medicaid/Medicare and self-pay.  °[facility] ° [address],  Phone: (336) 832-3150, Fax: (336) [phone]. Hours of Operation:  8:30 am - 5:30 pm, M-F.  Also accepts Medicaid and self-pay.  °[facility] [place] [address], [place] Phone: (336) 878-6027   °[facility] [address], [place], [place] (336)723-1848, Ext. 123 Mondays & Thursdays: 7-9 AM.  First 15 patients are seen on a first come, first serve basis. °  ° °Medicaid-accepting [place] Providers: ° °Organization         Address  Phone   Notes  °[facility] [address], [place] (336) 641-2100 Also accepts self-pay patients.  °[facility] [address], [place] ° (336) 856-9996   °[facility] [address], [place]  (336) 288-8857   °[facility] [address] [place] [address], [place] (336) 299-7000   °Veita Bland [address], [place]  ° (336) 373-1557 Only accepts [place] Access Medicaid patients after they have their name applied to their card.  ° °Self-Pay (no insurance) in [place]: ° °Organization         Address  Phone   Notes  °Sickle Cell Patients, [facility] [address], [place] (336) 832-1970   °[facility] [address], [place] (336) 832-4400   °[facility] [place] [facility] ° [address], [place] (336) 992-4800   °[facility]/Dr. Osei-Bonsu ° [address] [place] [address], [place] or [address], [place] (336) [phone] Phone number for both [place] and [place] locations is the same.  °[facility] [address], [place] (336) 299-0000   °[facility] [address] [place] [address], [place] or [address] (336) 852-7530 °(336) 878-2260   °[facility] [address], [place] (336) 350-1642, phone; (336) 294-5005, fax Sees patients 1st and 3rd Saturday of every month.  Must not qualify for public or private insurance (i.e. Medicaid, Medicare, [place] Health Choice, Veterans' Benefits) • Household income should be no more than 200% of the poverty level •The clinic cannot treat you if you are pregnant or think you are pregnant • Sexually transmitted diseases are not treated at the clinic.  ° ° °Dental Care: °Organization         Address  Phone  Notes  °[place] [facility] [address], [place] (336) 641-6152 Accepts children up to age 21 who are enrolled in Medicaid or [place] Health Choice; pregnant women with a Medicaid card; and children who have applied for Medicaid or [place] Health Choice, but were declined, whose parents can pay a reduced fee at time of service.  °[place]  [facility] [place]  [address], [place] (336) 641-7733 Accepts children up to age 21 who are enrolled in Medicaid or [place] Health Choice; pregnant women with a Medicaid card; and children who have applied for Medicaid or [place] Health Choice, but were declined, whose parents can pay a reduced fee at time of service.  °[facility] ° [address],  (336) 641-4533 Patients are seen by appointment only. Walk-ins are not accepted. [facility] will see patients 18 years of age and older. °Monday - Tuesday (8am-5pm) °Most Wednesdays (8:30-5pm) °$30 per visit, cash only  °[facility] ° [address], [place] (336) 641-4533 Patients are seen by appointment only. Walk-ins are not accepted. [facility] will see patients 18 years of age and older. °One   Wednesday Evening (Monthly: Volunteer Based).  $30 per visit, cash only  °[facility]  (919) [phone] for adults; Children under age 4, call Graduate Pediatric Dentistry at ([phone]) [phone]. Children aged 4-14, please call (919) [phone] to request a pediatric application. ° Dental services are provided in all areas of dental care including fillings, crowns and bridges, complete and partial dentures, implants, gum treatment, root canals, and extractions. Preventive care is also provided. Treatment is provided to both adults and children. °Patients are selected via a lottery and there is often a waiting list. °  °[facility] [address], °[place] ° (336) 763-8833 [email or web address] °  °[facility] [address], [place], [place] (336)723-1848, Ext. 123 Second and Fourth Thursday of each month, opens at 6:30 AM; Clinic ends at 9 AM.  Patients are seen on a first-come first-served basis, and a limited number are seen during each clinic.  ° °[facility] ° [address], [place], [place] (336) 723-7904    Eligibility Requirements °You must have lived in [place], [place], or [place] counties for at least the last three months. °  You cannot be eligible for state or federal sponsored healthcare insurance, including Veterans Administration, Medicaid, or Medicare. °  You generally cannot be eligible for healthcare insurance through your employer.  °  How to apply: °Eligibility screenings are held every Tuesday and Wednesday afternoon from 1:00 pm until 4:00 pm. You do not need an appointment for the interview!  °[facility] [address], [place], [place] 336-631-2330   °[facility]  336-342-8273   °[facility]  336-703-3100   °[place]  336-570-6415   ° °Behavioral Health Resources in the Community: °Intensive Outpatient Programs °Organization         Address  Phone  Notes  °[facility] [address], [place], [place] 336-878-6098   °[facility] [address], [place], [place] 336-832-9800   °[facility] [address], [place], [place] ° 336-882-2125   °[facility] [address],  °[place], [place] 1-800-853-5163 or 336-641-4981   °Substance Abuse Resources °Organization         Address  Phone  Notes  °[facility]  336-882-2125   °[facility]  336-784-9470   °[facility]  336-285-9073   °[facility]  336-845-3988   °Residential & Outpatient Substance Abuse Program  1-800-659-3381   °Psychological Services °Organization         Address  Phone  Notes  °[facility]  336- 832-9600   °[facility]  336- 378-7881   °[facility] [address], [place] 1-800-853-5163 or 336-641-4981   ° °Mobile Crisis Teams °Organization         Address  Phone  Notes  °[facility], Mobile Crisis Care Unit  1-877-626-1772   °Assertive °[facility] ° [address]  [place], [place] 336-834-9664   °Sharon DeEsch [address] °[place] 336-554-5454   ° °Self-Help/Support Groups °Organization         Address  Phone             Notes  °[facility] [place] [facility] - variety of support groups  336- 373-1402 Call for more information  °Narcotics Anonymous (NA), [facility] [address], °[place]   2 meetings at this location  ° °  Residential Treatment Programs Organization         Address  Phone  Notes  [facility] [address]    [place]  1-250-353-3098   [facility]  [address] [place] [address], [place], [place]   [facility] [place], [place] 386-669-3522 Admissions: 8am-3pm M-F  [facility] [place] [address],    [place], [place] [phone]   [facility] [address] [place], [place], [place]   [facility] [address]  [place], [place]   [facility] Intensive Outpatient [address], [place], [place]   [facility] [place].,  [place], [place] 1-765-338-3401 or 4138506214   [facility] [address] [place], [place] Accepts Medicaid  [facility] [place] [address],  [place] 1-406-172-8078 Substance Abuse/Addiction Treatment   [facility] Organization         Address  Phone  Notes  [facility]  (208)572-2966   Domenic Schwab, PhD [address] [place], [place]   551-234-5115 or 9896176789   [place], [place] (409)643-3904   [facility] [address], [place], [place] [phone] Insurance/Medicaid/sponsorship through [facility] [address]                                    [place], [place] [phone] [place] [address], [place] [phone]    Dr. Adele Schilder  [phone]   [facility] Dept. 1) [address] [place] 2) [place] 3)  [address], [place] [phone]  ([phone]   [place] (210)075-0357 or ([phone] (After Hours)     Take your usual prescriptions as previously directed.  Apply moist heat or ice to the area(s) of discomfort, for 15 minutes at a time, several times per day for the next few days.  Do not fall asleep on a heating or ice pack. Your xray showed an incidental finding of: "Rim calcified abnormal structure in the right hemipelvis, measuring at least 6.9 cm. Aneurysm not excluded. If CT is to be performed, inclusion of the abdomen and pelvis with lumbar spine reconstructions might be warranted to further characterize this lesion."  Call your regular medical doctor tomorrow morning to schedule a follow up appointment in the next 1 to 2 days.  Return to the Emergency Department immediately if worsening or you change your mind about having the CT scan performed.

## 2014-04-18 NOTE — Progress Notes (Signed)
Chief Complaint  Patient presents with  . Follow-up    Pt c/o increased SOB with exertion. Pt states his breahting is stable at rest. Denies cough, wheezing, chest tightness.     History of Present Illness: [name] [name] is a 80 y.o. male former smoker with GOLD 4 COPD/emphysema, and hypoxemia.  He gets winded with activity.  He has home oxygen, but doesn't use this much during the day >> his tanks are too heavy.  He is not having cough, wheeze, sputum, or chest pain.  He c/o knee pain which limits his activity also.  He is not having leg swelling.  TESTS: Spirometry 04/09/11 >> FEV1 0.69 (24%), FEV1% 37 PFT 05/09/11 >> FEV1 1.16(42%), FEV1% 45, TLC 5.22(81%), DLCO 40%, +BD RA ONO 11/13/11 >> Test time 8 hrs 41 min.  Average SpO2 92.6%, low SpO2 85%.  Spent 13 min with SpO2 < 88%. Start 1 liter oxygen qhs 11/22/11  PMHx >> PE, non ischemic CM, complete heart block s/p PM, HLD, Neuropathy, HTN, Prostate cancer, Aortic dissection  PSHx, Medications, Allergies, Fhx, Shx reviewed.  Physical Exam:  General - Thin  HEENT - no sinus tenderness, no oral exudate, no LAN, poor dentition Cardiac - s1s2 regular, 2/6 systolic murmur  Chest - prolonged exhalation, no wheeze/rales/dullness  Abdomen - soft, non-tender Extremities - no edema Neurologic - normal strength  Skin - no rashes  Psychiatric - normal mood, behavior   Assessment/Plan:  COPD with emphysema. Plan: - continue pulmicort and albuterol via nebulizer  Chronic respiratory failure with hypoxia. Plan: - continue 2.5 liters oxygen with exertion and sleep - will see if he can get more portable oxygen set up  Dyspnea >> explained that given the severity of his COPD he will always have difficulty with his breathing. Plan: - advised him to use supplemental oxygen more with exertion  Chesley Mires, MD [place] 04/18/2014, 10:21 AM Pager:  [phone] After 3pm call: [phone]

## 2014-04-18 NOTE — Patient Instructions (Signed)
Will try to arrange for more portable oxygen set up Follow up in 6 months
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
## 2014-04-18 NOTE — ED Notes (Signed)
MD at bedside. 

## 2014-04-18 NOTE — ED Notes (Signed)
MD at bedside. EDP MCMANUS PRESENT TO EVALUATE THIS PT
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
## 2014-05-04 ENCOUNTER — Encounter (HOSPITAL_COMMUNITY): Payer: Self-pay | Admitting: Internal Medicine

## 2014-05-06 ENCOUNTER — Other Ambulatory Visit: Payer: Self-pay | Admitting: Cardiovascular Disease

## 2014-05-08 ENCOUNTER — Telehealth: Payer: Self-pay | Admitting: Pulmonary Disease

## 2014-05-08 NOTE — Telephone Encounter (Signed)
This was done by his cardiologist I called [facility] to verify called pt he is aware

## 2014-05-08 NOTE — Telephone Encounter (Signed)
Pt wants his coreg called into the pharmacy [facility]

## 2014-05-22 ENCOUNTER — Encounter: Payer: Self-pay | Admitting: Internal Medicine

## 2014-06-02 ENCOUNTER — Ambulatory Visit (INDEPENDENT_AMBULATORY_CARE_PROVIDER_SITE_OTHER): Payer: Medicare Other | Admitting: Internal Medicine

## 2014-06-02 ENCOUNTER — Encounter: Payer: Self-pay | Admitting: Internal Medicine

## 2014-06-02 VITALS — BP 161/79 | HR 69 | Ht 71.5 in | Wt 160.0 lb

## 2014-06-02 DIAGNOSIS — I442 Atrioventricular block, complete: Secondary | ICD-10-CM

## 2014-06-02 DIAGNOSIS — I429 Cardiomyopathy, unspecified: Secondary | ICD-10-CM

## 2014-06-02 DIAGNOSIS — R0602 Shortness of breath: Secondary | ICD-10-CM

## 2014-06-02 LAB — MDC_IDC_ENUM_SESS_TYPE_INCLINIC
Battery Remaining Longevity: 68.4 mo
Brady Statistic RA Percent Paced: 96 %
Brady Statistic RV Percent Paced: 99.96 %
Implantable Pulse Generator Model: 3222
Implantable Pulse Generator Serial Number: 7633185
Lead Channel Impedance Value: 300 Ohm
Lead Channel Impedance Value: 387.5 Ohm
Lead Channel Pacing Threshold Amplitude: 0.75 V
Lead Channel Pacing Threshold Pulse Width: 0.5 ms
Lead Channel Pacing Threshold Pulse Width: 0.5 ms
Lead Channel Pacing Threshold Pulse Width: 0.5 ms
Lead Channel Sensing Intrinsic Amplitude: 1.8 mV
Lead Channel Setting Pacing Amplitude: 2 V
Lead Channel Setting Pacing Pulse Width: 0.5 ms
MDC IDC MSMT BATTERY VOLTAGE: 3.01 V
MDC IDC MSMT LEADCHNL LV PACING THRESHOLD AMPLITUDE: 0.75 V
MDC IDC MSMT LEADCHNL LV PACING THRESHOLD AMPLITUDE: 0.75 V
MDC IDC MSMT LEADCHNL RA PACING THRESHOLD PULSEWIDTH: 0.5 ms
MDC IDC MSMT LEADCHNL RV IMPEDANCE VALUE: 425 Ohm
MDC IDC MSMT LEADCHNL RV PACING THRESHOLD AMPLITUDE: 0.75 V
MDC IDC SESS DTM: 20160108134919
MDC IDC SET LEADCHNL RA PACING AMPLITUDE: 1.625
MDC IDC SET LEADCHNL RV PACING AMPLITUDE: 2.5 V
MDC IDC SET LEADCHNL RV PACING PULSEWIDTH: 0.5 ms
MDC IDC SET LEADCHNL RV SENSING SENSITIVITY: 8 mV

## 2014-06-02 NOTE — Patient Instructions (Signed)
Your physician recommends that you schedule a follow-up appointment in: 1 year with Dr. Rayann Heman. You will receive a reminder letter in the mail in about 10 months reminding you to call and schedule your appointment. If you don't receive this letter, please contact our office. Merlin Device 08/31/14. Your physician recommends that you continue on your current medications as directed. Please refer to the Current Medication list given to you today.

## 2014-06-02 NOTE — Progress Notes (Signed)
PCP: Monico Blitz, MD Primary Cardiologist:  Dr Dorrene German is a 81 y.o. male who presents today for routine electrophysiology followup.  Since last being seen in our clinic, the patient reports doing very well.  He has stable SOB and worse with exertion.  He has severe COPD and is followed by Dr Halford Chessman for this.  Today, he denies symptoms of palpitations, chest pain, lower extremity edema, dizziness, presyncope, or syncope.  The patient is otherwise without complaint today.  Past Medical History  Diagnosis Date  . COPD (chronic obstructive pulmonary disease)     GOLD stage IV COPD on home oxygen;   . Pulmonary embolism     Small LLL sub-segmental PE seen on CT 11/2011, unchanged from CT in May 2013; Chronic Coumadin Therapy  . Nonischemic cardiomyopathy     Mild-EF 45-50%  . CHB (complete heart block)     S/P PPM with CRT upgrade in 2008  . Tobacco abuse      Quit in the 1990s  . Hypercholesterolemia   . Peripheral neuropathy   . Hypertension   . Arthritis   . Allergic rhinitis   . Prostate cancer     s/p Prostatectomy 2008   . Pulmonary HTN     RHC 11/28/11 revealed minimally elevated PA pressures, nl wedge/cardiac output  . Aortic dissection     Localized ascending aortic dissection seen on CT 11/2011, unchanged from CT in May 2013, ? artifact  . Chronic respiratory failure with hypoxia   . Dyspnea     chronic   Past Surgical History  Procedure Laterality Date  . Prostatectomy    . Bi-ventricular pacemaker insertion (crt-p)  09/15/06; 02-14-14    [facility]J CRTP generator change by Dr Rayann Heman [phone]  . Right heart catheterization N/A 11/28/2011    Procedure: RIGHT HEART CATH;  Surgeon: Jolaine Artist, MD;  Location: [facility] CATH LAB;  Service: Cardiovascular;  Laterality: N/A;  . Permanent pacemaker generator change N/A 02/14/2014    Procedure: PERMANENT PACEMAKER GENERATOR CHANGE;  Surgeon: Coralyn Mark, MD;  Location: [facility] CATH LAB;  Service: Cardiovascular;  Laterality: N/A;     Current Outpatient Prescriptions  Medication Sig Dispense Refill  . albuterol (PROVENTIL HFA;VENTOLIN HFA) 108 (90 BASE) MCG/ACT inhaler Inhale 2 puffs into the lungs every 6 (six) hours as needed for wheezing or shortness of breath.    Marland Kitchen albuterol (PROVENTIL) (2.5 MG/3ML) 0.083% nebulizer solution Take 3 mLs (2.5 mg total) by nebulization 2 (two) times daily. 75 mL 6  . carvedilol (COREG) 12.5 MG tablet TAKE 1 TABLET BY MOUTH 2 TIMES DAILY WITH A MEAL. EMERGENCY REFILL FAXED DR 60 tablet 6  . flunisolide (NASALIDE) 0.025 % SOLN Place 2 sprays into the nose as needed (allergies).     . furosemide (LASIX) 40 MG tablet Take 40 mg by mouth daily as needed for fluid.    Marland Kitchen loratadine (CLARITIN) 10 MG tablet Take 10 mg by mouth daily as needed for allergies.     . Multiple Vitamin (MULTIVITAMIN WITH MINERALS) TABS Take 1 tablet by mouth daily.    . traMADol (ULTRAM) 50 MG tablet Take 1 tablet (50 mg total) by mouth every 6 (six) hours as needed. 10 tablet 0  . warfarin (COUMADIN) 5 MG tablet Take 7.5 mg by mouth every evening.     . budesonide (PULMICORT) 0.25 MG/2ML nebulizer solution Take 2 mLs (0.25 mg total) by nebulization 2 (two) times daily. 120 mL 6   No current facility-administered  medications for this visit.    Physical Exam: Filed Vitals:   06/02/14 1033 06/02/14 1037  BP: 171/72 161/79  Pulse: 69   Height: 5' 11.5" (1.816 m)   Weight: 160 lb (72.576 kg)   SpO2: 97%     GEN- The patient is well appearing, alert and oriented x 3 today.   Head- normocephalic, atraumatic Eyes-  Sclera clear, conjunctiva pink Ears- hearing intact Oropharynx- clear Lungs- Coarse BS at the bases, poor expiratory movement, normal work of breathing Chest- pacemaker pocket is well healed Heart- Regular rate and rhythm, no murmurs, rubs or gallops, PMI not laterally displaced GI- soft, NT, ND, + BS Extremities- no clubbing, cyanosis, or edema  Pacemaker interrogation- reviewed in detail today,   See PACEART report  Assessment and Plan:  1. Complete heart block Normal biV pacemaker function  See Pace Art report Today I have made rate response more aggressive due to flat histogram.  We ambulated him in the office and he felt better.  2. Nonischemic CM/ Chronic systolic dysfunction Stable No changes today  3. Hypertensive cardiovascular disease with CHF Stable No change required today  He will continue to follow up with Dr Bronson Ing as scheduled [facility]link Return to see me in 1 year

## 2014-06-22 ENCOUNTER — Encounter (INDEPENDENT_AMBULATORY_CARE_PROVIDER_SITE_OTHER): Payer: Medicare Other | Admitting: Ophthalmology

## 2014-06-22 DIAGNOSIS — H3531 Nonexudative age-related macular degeneration: Secondary | ICD-10-CM

## 2014-06-22 DIAGNOSIS — H43813 Vitreous degeneration, bilateral: Secondary | ICD-10-CM

## 2014-08-31 ENCOUNTER — Ambulatory Visit (INDEPENDENT_AMBULATORY_CARE_PROVIDER_SITE_OTHER): Payer: Medicare Other | Admitting: *Deleted

## 2014-08-31 DIAGNOSIS — I442 Atrioventricular block, complete: Secondary | ICD-10-CM

## 2014-08-31 LAB — MDC_IDC_ENUM_SESS_TYPE_REMOTE
Battery Remaining Longevity: 69 mo
Battery Voltage: 2.99 V
Brady Statistic AP VP Percent: 94 %
Brady Statistic AP VS Percent: 1 %
Brady Statistic AS VP Percent: 5.8 %
Brady Statistic AS VS Percent: 1 %
Brady Statistic RA Percent Paced: 94 %
Date Time Interrogation Session: 20160407060015
Implantable Pulse Generator Model: 3222
Lead Channel Impedance Value: 400 Ohm
Lead Channel Impedance Value: 410 Ohm
Lead Channel Pacing Threshold Amplitude: 0.5 V
Lead Channel Pacing Threshold Amplitude: 0.75 V
Lead Channel Pacing Threshold Pulse Width: 0.5 ms
Lead Channel Pacing Threshold Pulse Width: 0.5 ms
Lead Channel Pacing Threshold Pulse Width: 0.5 ms
Lead Channel Sensing Intrinsic Amplitude: 12 mV
Lead Channel Setting Pacing Amplitude: 1.5 V
Lead Channel Setting Pacing Amplitude: 2 V
Lead Channel Setting Pacing Amplitude: 2.5 V
Lead Channel Setting Pacing Pulse Width: 0.5 ms
MDC IDC MSMT BATTERY REMAINING PERCENTAGE: 94 %
MDC IDC MSMT LEADCHNL LV PACING THRESHOLD AMPLITUDE: 0.75 V
MDC IDC MSMT LEADCHNL RA IMPEDANCE VALUE: 290 Ohm
MDC IDC MSMT LEADCHNL RA SENSING INTR AMPL: 1.3 mV
MDC IDC PG SERIAL: 7633185
MDC IDC SET LEADCHNL LV PACING PULSEWIDTH: 0.5 ms
MDC IDC SET LEADCHNL RV SENSING SENSITIVITY: 8 mV

## 2014-08-31 NOTE — Progress Notes (Signed)
Remote pacemaker transmission.   

## 2014-09-11 ENCOUNTER — Encounter: Payer: Self-pay | Admitting: Cardiology

## 2014-09-13 ENCOUNTER — Encounter: Payer: Self-pay | Admitting: Internal Medicine

## 2014-09-21 ENCOUNTER — Encounter (INDEPENDENT_AMBULATORY_CARE_PROVIDER_SITE_OTHER): Payer: Medicare Other | Admitting: Ophthalmology

## 2014-09-21 DIAGNOSIS — H3531 Nonexudative age-related macular degeneration: Secondary | ICD-10-CM | POA: Diagnosis not present

## 2014-09-21 DIAGNOSIS — H43813 Vitreous degeneration, bilateral: Secondary | ICD-10-CM

## 2014-10-09 ENCOUNTER — Encounter: Payer: Self-pay | Admitting: Cardiovascular Disease

## 2014-10-09 ENCOUNTER — Ambulatory Visit (INDEPENDENT_AMBULATORY_CARE_PROVIDER_SITE_OTHER): Payer: Medicare Other | Admitting: Cardiovascular Disease

## 2014-10-09 VITALS — BP 167/79 | HR 90 | Ht 71.5 in | Wt 153.0 lb

## 2014-10-09 DIAGNOSIS — R0602 Shortness of breath: Secondary | ICD-10-CM

## 2014-10-09 DIAGNOSIS — I38 Endocarditis, valve unspecified: Secondary | ICD-10-CM

## 2014-10-09 DIAGNOSIS — I10 Essential (primary) hypertension: Secondary | ICD-10-CM

## 2014-10-09 DIAGNOSIS — I429 Cardiomyopathy, unspecified: Secondary | ICD-10-CM | POA: Diagnosis not present

## 2014-10-09 DIAGNOSIS — Z950 Presence of cardiac pacemaker: Secondary | ICD-10-CM

## 2014-10-09 DIAGNOSIS — J449 Chronic obstructive pulmonary disease, unspecified: Secondary | ICD-10-CM

## 2014-10-09 DIAGNOSIS — I350 Nonrheumatic aortic (valve) stenosis: Secondary | ICD-10-CM

## 2014-10-09 DIAGNOSIS — I77810 Thoracic aortic ectasia: Secondary | ICD-10-CM

## 2014-10-09 DIAGNOSIS — I351 Nonrheumatic aortic (valve) insufficiency: Secondary | ICD-10-CM

## 2014-10-09 DIAGNOSIS — R5383 Other fatigue: Secondary | ICD-10-CM

## 2014-10-09 DIAGNOSIS — Z86711 Personal history of pulmonary embolism: Secondary | ICD-10-CM

## 2014-10-09 NOTE — Progress Notes (Signed)
Patient ID: [name] [name], male   DOB: 07/25/1927, 81 y.o.   MRN: [id number]      SUBJECTIVE: The patient is an 81 year old male with a history of a nonischemic cardiomyopathy and significant COPD, for which he uses albuterol, Pulmicort, and oxygen. In March 5456, his LV systolic function was found to be normal. He also has moderate pulmonary hypertension, mild aortic stenosis, moderate aortic regurgitation, and moderate aortic root dilatation. He also has a biventricular pacemaker for complete heart block and nonischemic cardiomyopathy.   Device interrogation on 08/31/14 demonstrated normal device function with 370 mode switches and no high ventricular rates. Biventricular pacing occurred > 99% of the time.  He says he feels "rough" due to chronic shortness of breath from COPD. It hasn't gotten any worse per patient. Denies leg swelling. Says it is difficult to drive to [facility] for pulmonary appts and requests to see someone closer. Also complains of heaviness of oxygen tank.  Review of Systems: As per "subjective", otherwise negative.  Allergies  Allergen Reactions  . Lisinopril Swelling    Angioedema  . Aspirin Nausea And Vomiting    Current Outpatient Prescriptions  Medication Sig Dispense Refill  . albuterol (PROVENTIL HFA;VENTOLIN HFA) 108 (90 BASE) MCG/ACT inhaler Inhale 2 puffs into the lungs every 6 (six) hours as needed for wheezing or shortness of breath.    Marland Kitchen albuterol (PROVENTIL) (2.5 MG/3ML) 0.083% nebulizer solution Take 3 mLs (2.5 mg total) by nebulization 2 (two) times daily. 75 mL 6  . budesonide (PULMICORT) 0.25 MG/2ML nebulizer solution Take 0.25 mg by nebulization 2 (two) times daily.    . carvedilol (COREG) 12.5 MG tablet TAKE 1 TABLET BY MOUTH 2 TIMES DAILY WITH A MEAL. EMERGENCY REFILL FAXED DR 60 tablet 6  . flunisolide (NASALIDE) 0.025 % SOLN Place 2 sprays into the nose as needed (allergies).     . furosemide (LASIX) 40 MG tablet Take 40 mg by mouth daily as  needed for fluid.    Marland Kitchen loratadine (CLARITIN) 10 MG tablet Take 10 mg by mouth daily as needed for allergies.     . Multiple Vitamin (MULTIVITAMIN WITH MINERALS) TABS Take 1 tablet by mouth daily.    . traMADol (ULTRAM) 50 MG tablet Take 1 tablet (50 mg total) by mouth every 6 (six) hours as needed. 10 tablet 0  . warfarin (COUMADIN) 5 MG tablet Take 7.5 mg by mouth every evening.      No current facility-administered medications for this visit.    Past Medical History  Diagnosis Date  . COPD (chronic obstructive pulmonary disease)     GOLD stage IV COPD on home oxygen;   . Pulmonary embolism     Small LLL sub-segmental PE seen on CT 11/2011, unchanged from CT in May 2013; Chronic Coumadin Therapy  . Nonischemic cardiomyopathy     Mild-EF 45-50%  . CHB (complete heart block)     S/P PPM with CRT upgrade in 2008  . Tobacco abuse      Quit in the 1990s  . Hypercholesterolemia   . Peripheral neuropathy   . Hypertension   . Arthritis   . Allergic rhinitis   . Prostate cancer     s/p Prostatectomy 2008   . Pulmonary HTN     RHC 11/28/11 revealed minimally elevated PA pressures, nl wedge/cardiac output  . Aortic dissection     Localized ascending aortic dissection seen on CT 11/2011, unchanged from CT in May 2013, ? artifact  . Chronic respiratory failure  with hypoxia   . Dyspnea     chronic    Past Surgical History  Procedure Laterality Date  . Prostatectomy    . Bi-ventricular pacemaker insertion (crt-p)  09/15/06; 02-14-14    [facility]J CRTP generator change by Dr Rayann Heman [phone]  . Right heart catheterization N/A 11/28/2011    Procedure: RIGHT HEART CATH;  Surgeon: Jolaine Artist, MD;  Location: [facility] CATH LAB;  Service: Cardiovascular;  Laterality: N/A;  . Permanent pacemaker generator change N/A 02/14/2014    Procedure: PERMANENT PACEMAKER GENERATOR CHANGE;  Surgeon: Coralyn Mark, MD;  Location: [facility] CATH LAB;  Service: Cardiovascular;  Laterality: N/A;    History   Social History  .  Marital Status: Married    Spouse Name: N/A  . Number of Children: N/A  . Years of Education: N/A   Occupational History  . 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 Smoking status: Former Smoker -- 0.50 packs/day for 52 years    Types: Cigarettes    Start date: 02/01/1938    Quit date: 05/26/1993  . Smokeless tobacco: Former Systems developer    Types: Chew    Quit date: 05/26/1990     Comment: chewed tobacco 40 years less than a pack/day  . Alcohol Use: No     Comment: denies alcohol use  . Drug Use: No  . Sexual Activity: Not on file   Other Topics Concern  . Not on file   Social History Narrative   Retired Chief Strategy Officer.  Lives with wife of 45 years.     Filed Vitals:   10/09/14 0837  BP: 167/79  Pulse: 90  Height: 5' 11.5" (1.816 m)  Weight: 153 lb (69.4 kg)  SpO2: 95%    PHYSICAL EXAM General: NAD, using oxygen by nasal cannula. Neck: No JVD, no thyromegaly.  Lungs: Markedly diminished bilaterally. No rales or wheezes. CV: Nondisplaced PMI. Regular rate and rhythm, normal S1/S2, no S3/S4, III/VI late-peaking ejection systolic murmur best heard at RUSB, II/IV holodiastolic murmur best heard at left lower sternal border. No pretibial or periankle edema.  Abdomen: Soft, no distention.  Neurologic: Alert and oriented.  Psych: Normal affect.  Skin: Normal. Musculoskeletal: No gross deformities. Extremities: No clubbing or cyanosis.    ECG: Most recent ECG reviewed.      ASSESSMENT AND PLAN:  Biventricular cardiac pacemaker Followed by Dr. Rayann Heman. Most recent device interrogation noted above. He is on warfarin.   Cardiomyopathy nonischemic  Patient has normal LV systolic function, as confirmed by echocardiogram in March 2015. He had normal coronary arteries, by cardiac catheterization in 2012.   History of pulmonary embolus  On chronic warfarin for anticoagulation, followed by primary M.D.   Valvular heart disease  In March 2015, it was  demonstrated that his mild aortic stenosis has not progressed in severity, nor has his moderate aortic regurgitation with moderate pulmonary HTN. Will reassess by echocardiogram.   Pulmonary hypertension  Assessed as moderate, by echocardiogram in 07/2013.   Aortic root dilatation  Moderate as per echo in March 2014. Normal in size by echo done in 07/2013. Continue to monitor.   Essential hypertension Elevated today. Will monitor on current dose of Coreg for now as I am concerned that increasing this may worsen his COPD. Could consider adding amlodipine if it remains persistently elevated.  COPD As per patient request, I will see if he can see a pulmonologist in [place] (Dr. Luan Pulling).  Dispo: f/u 6 months.    Jamesetta So  Bronson Ing, M.D., F.A.C.C.

## 2014-10-09 NOTE — Patient Instructions (Signed)
Your physician wants you to follow-up in: 6 months with Dr. Virgina Jock will receive a reminder letter in the mail two months in advance. If you don't receive a letter, please call our office to schedule the follow-up appointment.  Your physician recommends that you continue on your current medications as directed. Please refer to the Current Medication list given to you today.  You have been referred to Pulmonologist Dr. Luan Pulling  Your physician has requested that you have an echocardiogram. Echocardiography is a painless test that uses sound waves to create images of your heart. It provides your doctor with information about the size and shape of your heart and how well your heart's chambers and valves are working. This procedure takes approximately one hour. There are no restrictions for this procedure.  Thank you for choosing [facility]!!

## 2014-10-11 ENCOUNTER — Encounter: Payer: Self-pay | Admitting: *Deleted

## 2014-10-11 ENCOUNTER — Ambulatory Visit (INDEPENDENT_AMBULATORY_CARE_PROVIDER_SITE_OTHER): Payer: Medicare Other

## 2014-10-11 ENCOUNTER — Other Ambulatory Visit: Payer: Self-pay

## 2014-10-11 DIAGNOSIS — I350 Nonrheumatic aortic (valve) stenosis: Secondary | ICD-10-CM

## 2014-10-11 NOTE — Progress Notes (Signed)
Patient asked to speak with someone regarding his sob. Spoke with patient and he said that his sob was a little bit worse today than it was on Monday when he was in the office. Patient did not have on his oxygen because he said it was too heavy. Patient did not seem to be using his accessory muscles today like Monday. Patient also questioned about his inhalers. Nurse advised patient that he did need to f/u with his pulmonologist asap and also ask his PCP about the inhalers. Patient informed that his echo results would be reviewed today and if his heart was the cause of the increased sob, that he would be contacted with a plan. Otherwise, patient advised to see the pulmonologist as suggested at recent office visit and if his sob gets worse, he needed to go to the ED for an evaluation. Patient verbalized understanding of plan.

## 2014-10-13 ENCOUNTER — Telehealth: Payer: Self-pay | Admitting: *Deleted

## 2014-10-13 DIAGNOSIS — I429 Cardiomyopathy, unspecified: Secondary | ICD-10-CM

## 2014-10-13 DIAGNOSIS — I272 Pulmonary hypertension, unspecified: Secondary | ICD-10-CM

## 2014-10-13 DIAGNOSIS — R0602 Shortness of breath: Secondary | ICD-10-CM

## 2014-10-13 MED ORDER — LISINOPRIL 5 MG PO TABS: 5.0000 mg | ORAL_TABLET | Freq: Every day | ORAL | Status: DC

## 2014-10-13 MED ORDER — FUROSEMIDE 20 MG PO TABS: ORAL_TABLET | ORAL | Status: DC

## 2014-10-13 MED ORDER — POTASSIUM CHLORIDE ER 10 MEQ PO TBCR: EXTENDED_RELEASE_TABLET | ORAL | Status: DC

## 2014-10-13 MED ORDER — LOSARTAN POTASSIUM 25 MG PO TABS: 25.0000 mg | ORAL_TABLET | Freq: Every day | ORAL | Status: DC

## 2014-10-13 NOTE — Telephone Encounter (Signed)
Dr. Bronson Ing - please advise on medication to replace the Lisinopril as suggested below.  This med is on his allergy list for angioedema.  Also, called patient to confirm that he can not tolerate this medication.

## 2014-10-13 NOTE — Telephone Encounter (Signed)
Would try losartan 25 mg daily.

## 2014-10-13 NOTE — Telephone Encounter (Signed)
Notes Recorded by Laurine Blazer, LPN on 1/88/6773 at 7:36 AM Patient & wife [name] [name]) notified. Will send new medications to [facility] today. Also, will give lab order to patient as he prefers to go to his PMD to have drawn. Follow up scheduled for 11/20/2014 with Dr. Bronson Ing.

## 2014-10-13 NOTE — Telephone Encounter (Signed)
Spoke with [place] pharmacist at [facility] to clarify d/c lisinopril and start losartan 25 mg. Pt also made aware. Medication sent to pharmacy.

## 2014-10-13 NOTE — Telephone Encounter (Signed)
-----   Message from Merlene Laughter, LPN sent at 0/17/4944 11:01 AM EDT -----   ----- Message -----    From: Herminio Commons, MD    Sent: 10/12/2014   9:29 AM      To: Merlene Laughter, LPN, Staci T Ashworth, CMA  LV function markedly declined. Please add lisinopril 5 mg daily as BP had been elevated at last visit. Also start Lasix 40 mg daily x 3 days then reduce to 20 mg daily. Check BMET on Monday. Give him Lasix 20 meq daily to be taken when he is taking Lasix 40 mg, then reduce to 10 meq daily when taking Lasix 20 mg daily.  Have him f/u with me within 1 month rather than 6 months.

## 2014-10-16 NOTE — Telephone Encounter (Signed)
Left message to return call 

## 2014-10-20 ENCOUNTER — Encounter: Payer: Self-pay | Admitting: *Deleted

## 2014-10-26 NOTE — Telephone Encounter (Signed)
Returned call to patient, discussed below.  Wanted to follow up to make sure he got new medication & was doing okay so far.  Reminded patient about doing lab & stated that he will go today or tomorrow.  Will fax lab order to [facility] now.  Follow up scheduled for 11/20/2014 with Dr. Bronson Ing.

## 2014-10-30 ENCOUNTER — Telehealth: Payer: Self-pay | Admitting: *Deleted

## 2014-10-30 NOTE — Telephone Encounter (Signed)
-----   Message from Weston Anna sent at 10/27/2014  2:05 PM EDT -----   ----- Message -----    From: Herminio Commons, MD    Sent: 10/27/2014  12:46 PM      To: Glynis Smiles. No result note.  ----- Message -----    From: Weston Anna    Sent: 10/27/2014  10:44 AM      To: Herminio Commons, MD

## 2014-10-30 NOTE — Telephone Encounter (Signed)
Patient notified via voicemail.

## 2014-11-20 ENCOUNTER — Encounter: Payer: Self-pay | Admitting: *Deleted

## 2014-11-20 ENCOUNTER — Ambulatory Visit (INDEPENDENT_AMBULATORY_CARE_PROVIDER_SITE_OTHER): Payer: Medicare Other | Admitting: Cardiovascular Disease

## 2014-11-20 ENCOUNTER — Encounter: Payer: Self-pay | Admitting: Cardiovascular Disease

## 2014-11-20 VITALS — BP 166/67 | HR 79 | Ht 71.5 in | Wt 158.0 lb

## 2014-11-20 DIAGNOSIS — R0602 Shortness of breath: Secondary | ICD-10-CM

## 2014-11-20 DIAGNOSIS — I77810 Thoracic aortic ectasia: Secondary | ICD-10-CM

## 2014-11-20 DIAGNOSIS — I10 Essential (primary) hypertension: Secondary | ICD-10-CM | POA: Diagnosis not present

## 2014-11-20 DIAGNOSIS — I429 Cardiomyopathy, unspecified: Secondary | ICD-10-CM | POA: Diagnosis not present

## 2014-11-20 DIAGNOSIS — I509 Heart failure, unspecified: Secondary | ICD-10-CM | POA: Diagnosis not present

## 2014-11-20 DIAGNOSIS — Z950 Presence of cardiac pacemaker: Secondary | ICD-10-CM

## 2014-11-20 DIAGNOSIS — I350 Nonrheumatic aortic (valve) stenosis: Secondary | ICD-10-CM

## 2014-11-20 DIAGNOSIS — R5383 Other fatigue: Secondary | ICD-10-CM

## 2014-11-20 DIAGNOSIS — J449 Chronic obstructive pulmonary disease, unspecified: Secondary | ICD-10-CM

## 2014-11-20 DIAGNOSIS — I351 Nonrheumatic aortic (valve) insufficiency: Secondary | ICD-10-CM

## 2014-11-20 MED ORDER — FUROSEMIDE 20 MG PO TABS: 20.0000 mg | ORAL_TABLET | Freq: Every day | ORAL | Status: DC

## 2014-11-20 NOTE — Patient Instructions (Addendum)
   Lasix 20mg  daily   Losartan 25mg  daily   You should be on these medications daily until told otherwise by your doctor.    Per [facility] - you have 6 refills left on each & pharmacy is getting refill ready for you today.  Referral to [facility] ([place] - Care Management  Continue all other medications.   Follow up in  3 months

## 2014-11-20 NOTE — Progress Notes (Signed)
Patient ID: MARTINE TRAGESER, male   DOB: [birth year], 81 y.o.   MRN: [id number]      SUBJECTIVE: The patient is an 81 year old male with a history of a nonischemic cardiomyopathy and significant COPD, for which he uses albuterol, Pulmicort, and oxygen. In March 1438, his LV systolic function was found to be normal. He also has moderate pulmonary hypertension, mild aortic stenosis, moderate aortic regurgitation, and moderate aortic root dilatation. He also has a biventricular pacemaker for complete heart block and nonischemic cardiomyopathy.   Device interrogation on 08/31/14 demonstrated normal device function with 370 mode switches and no high ventricular rates. Biventricular pacing occurred > 99% of the time.  However, recent echocardiogram showed significantly reduced left ventricular systolic function with moderate left ventricular dilatation, EF 35%. There was also mild aortic stenosis and moderate to severe aortic regurgitation. There was mild to moderate dilatation of the aortic root.  He was already on Coreg and I prescribed losartan (reportedly had angioedema with lisinopril) but he has not picked it up from the pharmacy yet. I also prescribed Lasix but he is not taking that either, thinking it was a temporary prescription.  BUN 23, creat 1.35 on 6/2.   He again says "I feel rough" with respect to shortness of breath.   Review of Systems: As per "subjective", otherwise negative.  Allergies  Allergen Reactions  . Lisinopril Swelling    Angioedema  . Aspirin Nausea And Vomiting    Current Outpatient Prescriptions  Medication Sig Dispense Refill  . albuterol (PROVENTIL HFA;VENTOLIN HFA) 108 (90 BASE) MCG/ACT inhaler Inhale 2 puffs into the lungs every 6 (six) hours as needed for wheezing or shortness of breath.    Marland Kitchen albuterol (PROVENTIL) (2.5 MG/3ML) 0.083% nebulizer solution Take 3 mLs (2.5 mg total) by nebulization 2 (two) times daily. 75 mL 6  . budesonide (PULMICORT) 0.25 MG/2ML  nebulizer solution Take 0.25 mg by nebulization 2 (two) times daily.    . carvedilol (COREG) 12.5 MG tablet TAKE 1 TABLET BY MOUTH 2 TIMES DAILY WITH A MEAL. EMERGENCY REFILL FAXED DR 60 tablet 6  . flunisolide (NASALIDE) 0.025 % SOLN Place 2 sprays into the nose as needed (allergies).     . gabapentin (NEURONTIN) 100 MG capsule Take 100 mg by mouth 2 (two) times daily.    Marland Kitchen loratadine (CLARITIN) 10 MG tablet Take 10 mg by mouth daily as needed for allergies.     . Multiple Vitamin (MULTIVITAMIN WITH MINERALS) TABS Take 1 tablet by mouth daily.    . traMADol (ULTRAM) 50 MG tablet Take 1 tablet (50 mg total) by mouth every 6 (six) hours as needed. 10 tablet 0  . warfarin (COUMADIN) 5 MG tablet Take 7.5 mg by mouth every evening.     Marland Kitchen losartan (COZAAR) 25 MG tablet Take 1 tablet (25 mg total) by mouth daily. (Patient not taking: Reported on 11/20/2014) 90 tablet 3   No current facility-administered medications for this visit.    Past Medical History  Diagnosis Date  . COPD (chronic obstructive pulmonary disease)     GOLD stage IV COPD on home oxygen;   . Pulmonary embolism     Small LLL sub-segmental PE seen on CT 11/2011, unchanged from CT in May 2013; Chronic Coumadin Therapy  . Nonischemic cardiomyopathy     Mild-EF 45-50%  . CHB (complete heart block)     S/P PPM with CRT upgrade in 2008  . Tobacco abuse      Quit in the 1990s  .  Hypercholesterolemia   . Peripheral neuropathy   . Hypertension   . Arthritis   . Allergic rhinitis   . Prostate cancer     s/p Prostatectomy 2008   . Pulmonary HTN     RHC 11/28/11 revealed minimally elevated PA pressures, nl wedge/cardiac output  . Aortic dissection     Localized ascending aortic dissection seen on CT 11/2011, unchanged from CT in May 2013, ? artifact  . Chronic respiratory failure with hypoxia   . Dyspnea     chronic    Past Surgical History  Procedure Laterality Date  . Prostatectomy    . Bi-ventricular pacemaker insertion  (crt-p)  09/15/06; 02-14-14    STJ CRTP generator change by Dr Rayann Heman [id number]  . Right heart catheterization N/A 11/28/2011    Procedure: RIGHT HEART CATH;  Surgeon: Jolaine Artist, MD;  Location: [facility] CATH LAB;  Service: Cardiovascular;  Laterality: N/A;  . Permanent pacemaker generator change N/A 02/14/2014    Procedure: PERMANENT PACEMAKER GENERATOR CHANGE;  Surgeon: Coralyn Mark, MD;  Location: [place] CATH LAB;  Service: Cardiovascular;  Laterality: N/A;    History   Social History  . Marital Status: Married    Spouse Name: N/A  . Number of Children: N/A  . Years of Education: N/A   Occupational History  . 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 Smoking status: Former Smoker -- 0.50 packs/day for 52 years    Types: Cigarettes    Start date: 02/01/1938    Quit date: 05/26/1993  . Smokeless tobacco: Former Systems developer    Types: Chew    Quit date: 05/26/1990     Comment: chewed tobacco 40 years less than a pack/day  . Alcohol Use: No     Comment: denies alcohol use  . Drug Use: No  . Sexual Activity: Not on file   Other Topics Concern  . Not on file   Social History Narrative   Retired Chief Strategy Officer.  Lives with wife of 63 years.     Filed Vitals:   11/20/14 1342  BP: 166/67  Pulse: 79  Height: 5' 11.5" (1.816 m)  Weight: 158 lb (71.668 kg)  SpO2: 97%    PHYSICAL EXAM General: NAD, using oxygen by nasal cannula. Neck: No JVD, no thyromegaly.  Lungs: Markedly diminished bilaterally. No rales or wheezes. CV: Nondisplaced PMI. Regular rate and rhythm, normal S1/S2, no S3/S4, III/VI late-peaking ejection systolic murmur best heard at RUSB, II/IV holodiastolic murmur best heard at left lower sternal border. No pretibial or periankle edema.  Abdomen: Soft, no distention.  Neurologic: Alert and oriented.  Psych: Normal affect.  Skin: Normal. Musculoskeletal: No gross deformities. Extremities: No clubbing or cyanosis.   ECG: Most recent ECG  reviewed.      ASSESSMENT AND PLAN:  Biventricular cardiac pacemaker Followed by Dr. Rayann Heman. Most recent device interrogation noted above. He is on warfarin.   Cardiomyopathy nonischemic  Previously had normal LV systolic function, as confirmed by echocardiogram in March 2015. He had normal coronary arteries, by cardiac catheterization in 2012.  However, echocardiogram on 5/18 demonstrated severely reduced left ventricular systolic function, EF 38%. Continue Coreg and emphasized importance of picking up prescriptions for losartan 25 mg and Lasix 20 mg daily.  History of pulmonary embolus  On chronic warfarin for anticoagulation, followed by primary M.D.   Valvular heart disease  Mild aortic stenosis and moderate to severe aortic regurgitation by echo on 10/11/14, with moderate LV dilatation and reduced EF  of 35%.  Pulmonary hypertension  Assessed as moderate, by echocardiogram in 07/2013. Not commented on in 09/2014.  Aortic root dilatation  Mild to moderate as per echo in May 2016. Normal in size by echo done in 07/2013. Continue to monitor.   Essential hypertension Elevated today. Start losartan 25 mg daily.  COPD Follow up with pulmonary.  Dispo: f/u 3 months.    Kate Sable, M.D., F.A.C.C.

## 2014-11-23 ENCOUNTER — Telehealth: Payer: Self-pay | Admitting: *Deleted

## 2014-11-23 NOTE — Telephone Encounter (Addendum)
[facility] contacted as they will go to the area he lives in [place]).  They can assist with medication & CHF education.  Will fax all info.     Informed patient & wife.  They are in agreement with trying this.    [facility] Phone:  [phone] Fax:  [phone].

## 2014-11-23 NOTE — Telephone Encounter (Signed)
Patient notified of below.   

## 2014-11-23 NOTE — Telephone Encounter (Signed)
RE: Order for Weimer,Morgon Noland Fordyce   Sent: Mon November 20, 2014 2:48 PM   To: Laurine Blazer, LPN    Message    Unfortunately, Patient's Primary Care Physician is not within [place]. If you should find the patient has a Primary Care Physician within [place] please advise our office.      Ronnell Freshwater. [place], [place] [facility]   [place] Assistant   Phone: [phone]   Fax: [phone]

## 2014-11-30 ENCOUNTER — Encounter: Payer: Self-pay | Admitting: Internal Medicine

## 2014-11-30 ENCOUNTER — Ambulatory Visit (INDEPENDENT_AMBULATORY_CARE_PROVIDER_SITE_OTHER): Payer: Medicare Other | Admitting: *Deleted

## 2014-11-30 DIAGNOSIS — I442 Atrioventricular block, complete: Secondary | ICD-10-CM

## 2014-11-30 NOTE — Progress Notes (Signed)
Remote pacemaker transmission.   

## 2014-12-04 LAB — CUP PACEART REMOTE DEVICE CHECK
Battery Remaining Longevity: 73 mo
Battery Remaining Percentage: 95.5 %
Battery Voltage: 2.99 V
Brady Statistic AP VP Percent: 94 %
Brady Statistic AS VP Percent: 5.7 %
Brady Statistic AS VS Percent: 1 %
Brady Statistic RA Percent Paced: 94 %
Date Time Interrogation Session: 20160707060016
Lead Channel Impedance Value: 290 Ohm
Lead Channel Pacing Threshold Pulse Width: 0.5 ms
Lead Channel Pacing Threshold Pulse Width: 0.5 ms
Lead Channel Pacing Threshold Pulse Width: 0.5 ms
Lead Channel Sensing Intrinsic Amplitude: 1.7 mV
Lead Channel Setting Pacing Amplitude: 2.5 V
Lead Channel Setting Pacing Pulse Width: 0.5 ms
Lead Channel Setting Pacing Pulse Width: 0.5 ms
Lead Channel Setting Sensing Sensitivity: 8 mV
MDC IDC MSMT LEADCHNL LV IMPEDANCE VALUE: 400 Ohm
MDC IDC MSMT LEADCHNL LV PACING THRESHOLD AMPLITUDE: 0.75 V
MDC IDC MSMT LEADCHNL RA PACING THRESHOLD AMPLITUDE: 0.5 V
MDC IDC MSMT LEADCHNL RV IMPEDANCE VALUE: 400 Ohm
MDC IDC MSMT LEADCHNL RV PACING THRESHOLD AMPLITUDE: 0.75 V
MDC IDC MSMT LEADCHNL RV SENSING INTR AMPL: 12 mV
MDC IDC PG SERIAL: 7633185
MDC IDC SET LEADCHNL LV PACING AMPLITUDE: 2 V
MDC IDC SET LEADCHNL RA PACING AMPLITUDE: 1.5 V
MDC IDC STAT BRADY AP VS PERCENT: 1 %
Pulse Gen Model: 3222

## 2014-12-06 ENCOUNTER — Encounter: Payer: Self-pay | Admitting: Cardiology

## 2015-01-03 ENCOUNTER — Other Ambulatory Visit: Payer: Self-pay | Admitting: Cardiovascular Disease

## 2015-01-12 ENCOUNTER — Telehealth: Payer: Self-pay | Admitting: *Deleted
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
## 2015-01-19 ENCOUNTER — Telehealth: Payer: Self-pay | Admitting: Cardiovascular Disease

## 2015-01-19 NOTE — Telephone Encounter (Signed)
Patient would like to speak

## 2015-02-20 ENCOUNTER — Ambulatory Visit (INDEPENDENT_AMBULATORY_CARE_PROVIDER_SITE_OTHER): Payer: Medicare Other | Admitting: Cardiovascular Disease

## 2015-02-20 ENCOUNTER — Encounter: Payer: Self-pay | Admitting: Cardiovascular Disease

## 2015-02-20 VITALS — BP 164/69 | HR 75 | Ht 71.5 in | Wt 153.0 lb

## 2015-02-20 DIAGNOSIS — I272 Pulmonary hypertension, unspecified: Secondary | ICD-10-CM

## 2015-02-20 DIAGNOSIS — I429 Cardiomyopathy, unspecified: Secondary | ICD-10-CM | POA: Diagnosis not present

## 2015-02-20 DIAGNOSIS — R0602 Shortness of breath: Secondary | ICD-10-CM

## 2015-02-20 DIAGNOSIS — I442 Atrioventricular block, complete: Secondary | ICD-10-CM

## 2015-02-20 DIAGNOSIS — I10 Essential (primary) hypertension: Secondary | ICD-10-CM

## 2015-02-20 DIAGNOSIS — I38 Endocarditis, valve unspecified: Secondary | ICD-10-CM

## 2015-02-20 DIAGNOSIS — I351 Nonrheumatic aortic (valve) insufficiency: Secondary | ICD-10-CM

## 2015-02-20 DIAGNOSIS — J439 Emphysema, unspecified: Secondary | ICD-10-CM

## 2015-02-20 DIAGNOSIS — I350 Nonrheumatic aortic (valve) stenosis: Secondary | ICD-10-CM

## 2015-02-20 DIAGNOSIS — Z950 Presence of cardiac pacemaker: Secondary | ICD-10-CM

## 2015-02-20 DIAGNOSIS — R5383 Other fatigue: Secondary | ICD-10-CM

## 2015-02-20 DIAGNOSIS — I77810 Thoracic aortic ectasia: Secondary | ICD-10-CM

## 2015-02-20 DIAGNOSIS — I270 Primary pulmonary hypertension: Secondary | ICD-10-CM

## 2015-02-20 MED ORDER — LOSARTAN POTASSIUM 50 MG PO TABS: 50.0000 mg | ORAL_TABLET | Freq: Every day | ORAL | Status: DC

## 2015-02-20 NOTE — Progress Notes (Signed)
Patient ID: AQUIL DUHE, male   DOB: 1927/08/03, 81 y.o.   MRN: [id number]      SUBJECTIVE: The patient returns for follow-up of nonischemic cardiomyopathy , hypertension , and biventricular pacemaker. Device interrogation on 11/30/14 demonstrated normal function with 1989 mode switches (<1%) and no high ventricular rate episodes. Biventricular pacing occurred greater than 99%.  He denies leg swelling an chest pain and his chronic exertional dyspnea is at baseline. He has not been hospitalized since his last office visit with me. He continues to feel weak. His blood pressure is elevated today.  Review of Systems: As per "subjective", otherwise negative.  Allergies  Allergen Reactions  . Lisinopril Swelling    Angioedema  . Aspirin Nausea And Vomiting    Current Outpatient Prescriptions  Medication Sig Dispense Refill  . albuterol (PROVENTIL HFA;VENTOLIN HFA) 108 (90 BASE) MCG/ACT inhaler Inhale 2 puffs into the lungs every 6 (six) hours as needed for wheezing or shortness of breath.    Marland Kitchen albuterol (PROVENTIL) (2.5 MG/3ML) 0.083% nebulizer solution Take 3 mLs (2.5 mg total) by nebulization 2 (two) times daily. 75 mL 6  . budesonide (PULMICORT) 0.25 MG/2ML nebulizer solution Take 0.25 mg by nebulization 2 (two) times daily.    . carvedilol (COREG) 12.5 MG tablet TAKE ONE TABLET BY MOUTH TWICE DAILY WITH MEALS 60 tablet 3  . flunisolide (NASALIDE) 0.025 % SOLN Place 2 sprays into the nose as needed (allergies).     . furosemide (LASIX) 20 MG tablet Take 1 tablet (20 mg total) by mouth daily.    Marland Kitchen gabapentin (NEURONTIN) 100 MG capsule Take 100 mg by mouth 2 (two) times daily.    Marland Kitchen loratadine (CLARITIN) 10 MG tablet Take 10 mg by mouth daily as needed for allergies.     Marland Kitchen losartan (COZAAR) 25 MG tablet Take 25 mg by mouth daily.    . Multiple Vitamin (MULTIVITAMIN WITH MINERALS) TABS Take 1 tablet by mouth daily.    . traMADol (ULTRAM) 50 MG tablet Take 1 tablet (50 mg total) by mouth every 6  (six) hours as needed. 10 tablet 0  . warfarin (COUMADIN) 5 MG tablet Take 7.5 mg by mouth every evening.      No current facility-administered medications for this visit.    Past Medical History  Diagnosis Date  . COPD (chronic obstructive pulmonary disease)     GOLD stage IV COPD on home oxygen;   . Pulmonary embolism     Small LLL sub-segmental PE seen on CT 11/2011, unchanged from CT in May 2013; Chronic Coumadin Therapy  . Nonischemic cardiomyopathy     Mild-EF 45-50%  . CHB (complete heart block)     S/P PPM with CRT upgrade in 2008  . Tobacco abuse      Quit in the 1990s  . Hypercholesterolemia   . Peripheral neuropathy   . Hypertension   . Arthritis   . Allergic rhinitis   . Prostate cancer     s/p Prostatectomy 2008   . Pulmonary HTN     RHC 11/28/11 revealed minimally elevated PA pressures, nl wedge/cardiac output  . Aortic dissection     Localized ascending aortic dissection seen on CT 11/2011, unchanged from CT in May 2013, ? artifact  . Chronic respiratory failure with hypoxia   . Dyspnea     chronic    Past Surgical History  Procedure Laterality Date  . Prostatectomy    . Bi-ventricular pacemaker insertion (crt-p)  09/15/06; 02-14-14  STJ CRTP generator change by Dr Rayann Heman [id number]  . Right heart catheterization N/A 11/28/2011    Procedure: RIGHT HEART CATH;  Surgeon: Jolaine Artist, MD;  Location: [facility] CATH LAB;  Service: Cardiovascular;  Laterality: N/A;  . Permanent pacemaker generator change N/A 02/14/2014    Procedure: PERMANENT PACEMAKER GENERATOR CHANGE;  Surgeon: Coralyn Mark, MD;  Location: [place] CATH LAB;  Service: Cardiovascular;  Laterality: N/A;    Social History   Social History  . Marital Status: Married    Spouse Name: N/A  . Number of Children: N/A  . Years of Education: N/A   Occupational History  . 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 Smoking status: Former Smoker -- 0.50 packs/day for 52 years    Types: Cigarettes     Start date: 02/01/1938    Quit date: 05/26/1993  . Smokeless tobacco: Former Systems developer    Types: Chew    Quit date: 05/26/1990     Comment: chewed tobacco 40 years less than a pack/day  . Alcohol Use: No     Comment: denies alcohol use  . Drug Use: No  . Sexual Activity: Not on file   Other Topics Concern  . 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     Filed Vitals:   02/20/15 1013  BP: 164/69  Pulse: 75  Height: 5' 11.5" (1.816 m)  Weight: 153 lb (69.4 kg)  SpO2: 97%    PHYSICAL EXAM General: NAD, using oxygen by nasal cannula. Neck: No JVD, no thyromegaly.  Lungs: Markedly diminished bilaterally. No rales or wheezes. CV: Nondisplaced PMI. Regular rate and rhythm, normal S1/S2, no S3/S4, III/VI late-peaking ejection systolic murmur best heard at RUSB, II/IV holodiastolic murmur best heard at left lower sternal border. No pretibial or periankle edema.  Abdomen: Soft, no distention.  Neurologic: Alert and oriented.  Psych: Normal affect.  Skin: Normal. Musculoskeletal: No gross deformities. Extremities: No clubbing or cyanosis.   ECG: Most recent ECG reviewed.      ASSESSMENT AND PLAN:  Biventricular cardiac pacemaker Followed by Dr. Rayann Heman. Most recent device interrogation noted above.   Nonischemic cardiomyopathy Previously had normal LV systolic function, as confirmed by echocardiogram in March 2015. He had normal coronary arteries, by cardiac catheterization in 2012.  However, echocardiogram on 10/11/14 demonstrated severely reduced left ventricular systolic function, EF 10%. Continue Coreg, losartan, and Lasix.  History of pulmonary embolus  On chronic warfarin for anticoagulation, followed by primary M.D.   Valvular heart disease  Mild aortic stenosis and moderate to severe aortic regurgitation by echo on 10/11/14, with moderate LV dilatation and reduced EF of 35%.  Pulmonary hypertension   Assessed as moderate, by echocardiogram in 07/2013. Not commented on in 09/2014.  Aortic root dilatation  Mild to moderate as per echo in May 2016. Normal in size by echo done in 07/2013. Continue to monitor.   Essential hypertension Elevated today. Increase losartan to 50 mg daily.  COPD Follow up with pulmonary.  Dispo: f/u 6 months.    Kate Sable, M.D., F.A.C.C.

## 2015-02-20 NOTE — Patient Instructions (Signed)
   Increase Losartan to 50mg  daily - may take 2 of your 25mg  tablets daily till finish current supply.  New 50mg  sent to [facility] today.   Continue all other medications.   Your physician wants you to follow up in: 6 months.  You will receive a reminder letter in the mail one-two months in advance.  If you don't receive a letter, please call our office to schedule the follow up appointment

## 2015-03-01 ENCOUNTER — Encounter: Payer: Medicare Other | Admitting: *Deleted

## 2015-03-01 ENCOUNTER — Telehealth: Payer: Self-pay | Admitting: Cardiology

## 2015-03-01 NOTE — Telephone Encounter (Signed)
Spoke with pt and reminded pt of remote transmission that is due today. Pt verbalized understanding.   

## 2015-03-02 ENCOUNTER — Encounter: Payer: Self-pay | Admitting: Cardiology

## 2015-03-12 ENCOUNTER — Telehealth: Payer: Self-pay | Admitting: Internal Medicine
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
## 2015-03-12 NOTE — Telephone Encounter (Signed)
New Message  Pt calling to resch no show appt- pt also requested to have new home monitor- pt stated his does not work. Please call back and discuss.

## 2015-03-29 ENCOUNTER — Telehealth: Payer: Self-pay | Admitting: Internal Medicine

## 2015-03-29 NOTE — Telephone Encounter (Signed)
Please call patient or mary about remote check.  They are unclear on how to do this

## 2015-03-29 NOTE — Telephone Encounter (Signed)
instructed pt wife how to send remote transmission.

## 2015-04-03 ENCOUNTER — Ambulatory Visit (INDEPENDENT_AMBULATORY_CARE_PROVIDER_SITE_OTHER): Payer: Medicare Other | Admitting: *Deleted

## 2015-04-03 DIAGNOSIS — I442 Atrioventricular block, complete: Secondary | ICD-10-CM

## 2015-04-03 LAB — CUP PACEART REMOTE DEVICE CHECK
Battery Remaining Longevity: 76 mo
Battery Remaining Percentage: 95.5 %
Brady Statistic AS VP Percent: 5 %
Implantable Lead Implant Date: 19990304
Implantable Lead Implant Date: 20080422
Implantable Lead Location: 753860
Implantable Lead Model: 4193
Lead Channel Impedance Value: 310 Ohm
Lead Channel Impedance Value: 430 Ohm
Lead Channel Impedance Value: 440 Ohm
Lead Channel Pacing Threshold Amplitude: 0.5 V
Lead Channel Sensing Intrinsic Amplitude: 1.6 mV
Lead Channel Setting Pacing Amplitude: 1.5 V
Lead Channel Setting Pacing Amplitude: 2.5 V
Lead Channel Setting Pacing Pulse Width: 0.5 ms
Lead Channel Setting Sensing Sensitivity: 8 mV
MDC IDC LEAD IMPLANT DT: 19990304
MDC IDC LEAD LOCATION: 753858
MDC IDC LEAD LOCATION: 753859
MDC IDC MSMT BATTERY VOLTAGE: 2.99 V
MDC IDC MSMT LEADCHNL RA PACING THRESHOLD PULSEWIDTH: 0.5 ms
MDC IDC SESS DTM: 20161108070013
MDC IDC SET LEADCHNL LV PACING AMPLITUDE: 2 V
MDC IDC SET LEADCHNL LV PACING PULSEWIDTH: 0.5 ms
MDC IDC STAT BRADY AP VP PERCENT: 95 %
MDC IDC STAT BRADY AP VS PERCENT: 1 %
MDC IDC STAT BRADY AS VS PERCENT: 1 %
MDC IDC STAT BRADY RA PERCENT PACED: 95 %
Pulse Gen Serial Number: 7633185

## 2015-04-03 NOTE — Progress Notes (Signed)
Remote pacemaker transmission.   

## 2015-04-06 ENCOUNTER — Encounter: Payer: Self-pay | Admitting: Cardiology

## 2015-05-22 ENCOUNTER — Other Ambulatory Visit: Payer: Self-pay | Admitting: Cardiovascular Disease

## 2015-06-27 ENCOUNTER — Encounter: Payer: Self-pay | Admitting: *Deleted

## 2015-07-05 ENCOUNTER — Encounter: Payer: Medicare Other | Admitting: Internal Medicine

## 2015-08-14 ENCOUNTER — Ambulatory Visit (INDEPENDENT_AMBULATORY_CARE_PROVIDER_SITE_OTHER): Payer: Medicare Other | Admitting: Cardiovascular Disease

## 2015-08-14 ENCOUNTER — Encounter: Payer: Self-pay | Admitting: Cardiovascular Disease

## 2015-08-14 VITALS — BP 122/60 | HR 82 | Ht 71.0 in | Wt 158.0 lb

## 2015-08-14 DIAGNOSIS — I351 Nonrheumatic aortic (valve) insufficiency: Secondary | ICD-10-CM

## 2015-08-14 DIAGNOSIS — J439 Emphysema, unspecified: Secondary | ICD-10-CM

## 2015-08-14 DIAGNOSIS — Z950 Presence of cardiac pacemaker: Secondary | ICD-10-CM

## 2015-08-14 DIAGNOSIS — I10 Essential (primary) hypertension: Secondary | ICD-10-CM | POA: Diagnosis not present

## 2015-08-14 DIAGNOSIS — I429 Cardiomyopathy, unspecified: Secondary | ICD-10-CM

## 2015-08-14 DIAGNOSIS — Z86711 Personal history of pulmonary embolism: Secondary | ICD-10-CM

## 2015-08-14 DIAGNOSIS — I272 Other secondary pulmonary hypertension: Secondary | ICD-10-CM

## 2015-08-14 DIAGNOSIS — I5022 Chronic systolic (congestive) heart failure: Secondary | ICD-10-CM

## 2015-08-14 DIAGNOSIS — R0602 Shortness of breath: Secondary | ICD-10-CM | POA: Diagnosis not present

## 2015-08-14 DIAGNOSIS — I77810 Thoracic aortic ectasia: Secondary | ICD-10-CM

## 2015-08-14 DIAGNOSIS — I350 Nonrheumatic aortic (valve) stenosis: Secondary | ICD-10-CM

## 2015-08-14 DIAGNOSIS — I38 Endocarditis, valve unspecified: Secondary | ICD-10-CM

## 2015-08-14 NOTE — Patient Instructions (Signed)
Continue all current medications. Your physician wants you to follow up in: 6 months.  You will receive a reminder letter in the mail one-two months in advance.  If you don't receive a letter, please call our office to schedule the follow up appointment   

## 2015-08-14 NOTE — Progress Notes (Signed)
Patient ID: [name] [name], male   DOB: 03/15/1928, 82 y.o.   MRN: [id number]      SUBJECTIVE: The patient returns for follow-up of nonischemic cardiomyopathy , hypertension , and biventricular pacemaker. Device interrogation on 04/03/15 demonstrated normal function.  He denies leg swelling, chest pain, orthopnea, PND, and chronic exertional dyspnea is at baseline. Uses 2.5 L oxygen.   Soc: Has been married x 62 years.   Review of Systems: As per "subjective", otherwise negative.  Allergies  Allergen Reactions  . Lisinopril Swelling    Angioedema  . Aspirin Nausea And Vomiting    Current Outpatient Prescriptions  Medication Sig Dispense Refill  . albuterol (PROVENTIL HFA;VENTOLIN HFA) 108 (90 BASE) MCG/ACT inhaler Inhale 2 puffs into the lungs every 6 (six) hours as needed for wheezing or shortness of breath.    Marland Kitchen albuterol (PROVENTIL) (2.5 MG/3ML) 0.083% nebulizer solution Take 3 mLs (2.5 mg total) by nebulization 2 (two) times daily. 75 mL 6  . budesonide (PULMICORT) 0.25 MG/2ML nebulizer solution Take 0.25 mg by nebulization 2 (two) times daily.    . carvedilol (COREG) 12.5 MG tablet TAKE ONE TABLET BY MOUTH TWICE DAILY WITH MEALS 60 tablet 6  . flunisolide (NASALIDE) 0.025 % SOLN Place 2 sprays into the nose as needed (allergies).     . furosemide (LASIX) 20 MG tablet Take 1 tablet (20 mg total) by mouth daily.    Marland Kitchen gabapentin (NEURONTIN) 100 MG capsule Take 100 mg by mouth 2 (two) times daily.    Marland Kitchen loratadine (CLARITIN) 10 MG tablet Take 10 mg by mouth daily as needed for allergies.     Marland Kitchen losartan (COZAAR) 50 MG tablet Take 1 tablet (50 mg total) by mouth daily. 30 tablet 6  . Multiple Vitamin (MULTIVITAMIN WITH MINERALS) TABS Take 1 tablet by mouth daily.    . traMADol (ULTRAM) 50 MG tablet Take 1 tablet (50 mg total) by mouth every 6 (six) hours as needed. 10 tablet 0  . warfarin (COUMADIN) 5 MG tablet Take 7.5 mg by mouth every evening.      No current facility-administered  medications for this visit.    Past Medical History  Diagnosis Date  . COPD (chronic obstructive pulmonary disease) (HCC)     GOLD stage IV COPD on home oxygen;   . Pulmonary embolism ([place])     Small LLL sub-segmental PE seen on CT 11/2011, unchanged from CT in May 2013; Chronic Coumadin Therapy  . Nonischemic cardiomyopathy (HCC)     Mild-EF 45-50%  . CHB (complete heart block) (HCC)     S/P PPM with CRT upgrade in 2008  . Tobacco abuse      Quit in the 1990s  . Hypercholesterolemia   . Peripheral neuropathy ([place])   . Hypertension   . Arthritis   . Allergic rhinitis   . Prostate cancer [facility])     s/p Prostatectomy 2008   . Pulmonary HTN ([place])     [place] 11/28/11 revealed minimally elevated PA pressures, nl wedge/cardiac output  . Aortic dissection (HCC)     Localized ascending aortic dissection seen on CT 11/2011, unchanged from CT in May 2013, ? artifact  . Chronic respiratory failure with hypoxia ([place])   . Dyspnea     chronic    Past Surgical History  Procedure Laterality Date  . Prostatectomy    . Bi-ventricular pacemaker insertion (crt-p)  09/15/06; 02-14-14    STJ CRTP generator change by Dr Rayann Heman [id number]  . Right heart catheterization  N/A 11/28/2011    Procedure: RIGHT HEART CATH;  Surgeon: Jolaine Artist, MD;  Location: [facility] CATH LAB;  Service: Cardiovascular;  Laterality: N/A;  . Permanent pacemaker generator change N/A 02/14/2014    Procedure: PERMANENT PACEMAKER GENERATOR CHANGE;  Surgeon: Coralyn Mark, MD;  Location: [place] CATH LAB;  Service: Cardiovascular;  Laterality: N/A;    Social History   Social History  . Marital Status: Married    Spouse Name: N/A  . Number of Children: N/A  . Years of Education: N/A   Occupational History  . 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 Smoking status: Former Smoker -- 0.50 packs/day for 52 years    Types: Cigarettes    Start date: 02/01/1938    Quit date: 05/26/1993  . Smokeless tobacco: Former Systems developer     Types: Chew    Quit date: 05/26/1990     Comment: chewed tobacco 40 years less than a pack/day  . Alcohol Use: No     Comment: denies alcohol use  . Drug Use: No  . Sexual Activity: Not on file   Other Topics Concern  . 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     Filed Vitals:   08/14/15 1032  BP: 122/60  Pulse: 82  Height: 5\' 11"  (1.803 m)  Weight: 158 lb (71.668 kg)  SpO2: 91%    PHYSICAL EXAM General: NAD, using oxygen by nasal cannula. Neck: No JVD, no thyromegaly.  Lungs: Markedly diminished bilaterally. No rales or wheezes. CV: Nondisplaced PMI. Regular rate and rhythm, normal S1/S2, no S3/S4, III/VI late-peaking ejection systolic murmur best heard at RUSB, II/IV holodiastolic murmur best heard at left lower sternal border. No pretibial or periankle edema.  Abdomen: Soft, no distention.  Neurologic: Alert and oriented.  Psych: Normal affect.  Skin: Normal.  ECG: Most recent ECG reviewed.      ASSESSMENT AND PLAN:  Biventricular cardiac pacemaker Followed by Dr. Rayann Heman. Most recent device interrogation noted above.   Nonischemic cardiomyopathy/chronic systolic heart failure Previously had normal LV systolic function, as confirmed by echocardiogram in March 2015. He had normal coronary arteries, by cardiac catheterization in 2012.  However, echocardiogram on 10/11/14 demonstrated severely reduced left ventricular systolic function, [id number]. Continue Coreg, losartan, and Lasix.  History of pulmonary embolus  On chronic warfarin for anticoagulation, followed by primary M.D.   Valvular heart disease  Mild aortic stenosis and moderate to severe aortic regurgitation by echo on 10/11/14, with moderate LV dilatation and reduced EF of 35%.  Pulmonary hypertension  Assessed as moderate, by echocardiogram in 07/2013. Not commented on in 09/2014.  Aortic root dilatation  Mild to moderate as per echo in May  2016. Normal in size by echo done in 07/2013. Continue to monitor.   Essential hypertension Controlled. No changes.  COPD Follow up with pulmonary.  Dispo: f/u 6 months.    Kate Sable, M.D., F.A.C.C.

## 2015-08-31 ENCOUNTER — Ambulatory Visit: Payer: Medicare Other | Admitting: Cardiovascular Disease

## 2015-09-26 ENCOUNTER — Ambulatory Visit (INDEPENDENT_AMBULATORY_CARE_PROVIDER_SITE_OTHER): Payer: Medicare Other | Admitting: Ophthalmology

## 2015-10-07 ENCOUNTER — Other Ambulatory Visit: Payer: Self-pay | Admitting: Cardiovascular Disease

## 2015-10-17 ENCOUNTER — Encounter: Payer: Self-pay | Admitting: *Deleted

## 2015-10-19 ENCOUNTER — Encounter: Payer: Self-pay | Admitting: Cardiovascular Disease

## 2015-10-19 ENCOUNTER — Ambulatory Visit (INDEPENDENT_AMBULATORY_CARE_PROVIDER_SITE_OTHER): Payer: Medicare Other | Admitting: Cardiovascular Disease

## 2015-10-19 VITALS — BP 120/61 | HR 76 | Ht 71.0 in | Wt 142.0 lb

## 2015-10-19 DIAGNOSIS — Z86711 Personal history of pulmonary embolism: Secondary | ICD-10-CM

## 2015-10-19 DIAGNOSIS — I429 Cardiomyopathy, unspecified: Secondary | ICD-10-CM

## 2015-10-19 DIAGNOSIS — R5383 Other fatigue: Secondary | ICD-10-CM

## 2015-10-19 DIAGNOSIS — I38 Endocarditis, valve unspecified: Secondary | ICD-10-CM

## 2015-10-19 DIAGNOSIS — I350 Nonrheumatic aortic (valve) stenosis: Secondary | ICD-10-CM

## 2015-10-19 DIAGNOSIS — I5022 Chronic systolic (congestive) heart failure: Secondary | ICD-10-CM

## 2015-10-19 DIAGNOSIS — Z87898 Personal history of other specified conditions: Secondary | ICD-10-CM

## 2015-10-19 DIAGNOSIS — J439 Emphysema, unspecified: Secondary | ICD-10-CM

## 2015-10-19 DIAGNOSIS — I10 Essential (primary) hypertension: Secondary | ICD-10-CM | POA: Diagnosis not present

## 2015-10-19 DIAGNOSIS — Z9289 Personal history of other medical treatment: Secondary | ICD-10-CM

## 2015-10-19 DIAGNOSIS — I351 Nonrheumatic aortic (valve) insufficiency: Secondary | ICD-10-CM

## 2015-10-19 DIAGNOSIS — I77810 Thoracic aortic ectasia: Secondary | ICD-10-CM

## 2015-10-19 DIAGNOSIS — Z950 Presence of cardiac pacemaker: Secondary | ICD-10-CM

## 2015-10-19 DIAGNOSIS — I272 Other secondary pulmonary hypertension: Secondary | ICD-10-CM

## 2015-10-19 DIAGNOSIS — R0602 Shortness of breath: Secondary | ICD-10-CM

## 2015-10-19 NOTE — Patient Instructions (Signed)
Continue all current medications. Your physician wants you to follow up in: 6 months.  You will receive a reminder letter in the mail one-two months in advance.  If you don't receive a letter, please call our office to schedule the follow up appointment   

## 2015-10-19 NOTE — Progress Notes (Signed)
Patient ID: [name] [name], male   DOB: 1927/07/10, 82 y.o.   MRN: [id number]      SUBJECTIVE: The patient returns for follow-up of nonischemic cardiomyopathy , hypertension , and biventricular pacemaker. Device interrogation on 04/03/15 demonstrated normal function.  He was hospitalized with a chief complaint of nausea on 10/16/15 at [facility]. I reviewed all relevant labs, studies, and documentation pertaining to this hospitalization.  Labs dated 5/22 sodium 142, potassium 4.6, BUN 46, creatinine 2.07, troponin 0.02, BNP 1908, hemoglobin 12.3, platelets 197.  Wt today 142 lbs (158 lbs 08/14/15).  He is chronically short of breath. My nurse checked his oxygen tank which he is using and it is empty. I spoke with her daughter, Kathe Becton, who is a Marine scientist.  His wife tells me he is short of breath even with oxygen. He has no leg edema.   Review of Systems: As per "subjective", otherwise negative.  Allergies  Allergen Reactions  . Lisinopril Swelling    Angioedema  . Aspirin Nausea And Vomiting    Current Outpatient Prescriptions  Medication Sig Dispense Refill  . acetaminophen (TYLENOL) 650 MG CR tablet Take 650 mg by mouth every 8 (eight) hours as needed for pain.    Marland Kitchen albuterol (PROVENTIL HFA;VENTOLIN HFA) 108 (90 BASE) MCG/ACT inhaler Inhale 2 puffs into the lungs every 6 (six) hours as needed for wheezing or shortness of breath.    Marland Kitchen albuterol (PROVENTIL) (2.5 MG/3ML) 0.083% nebulizer solution Take 3 mLs (2.5 mg total) by nebulization 2 (two) times daily. 75 mL 6  . budesonide (PULMICORT) 0.25 MG/2ML nebulizer solution Take 0.25 mg by nebulization 2 (two) times daily.    . carvedilol (COREG) 12.5 MG tablet TAKE ONE TABLET BY MOUTH TWICE DAILY WITH MEALS 60 tablet 6  . dextromethorphan-guaiFENesin (MUCINEX DM) 30-600 MG 12hr tablet Take 1 tablet by mouth 2 (two) times daily.    . flunisolide (NASALIDE) 0.025 % SOLN Place 2 sprays into the nose as needed (allergies).     . furosemide (LASIX)  40 MG tablet Take 40 mg by mouth 2 (two) times daily.    Marland Kitchen gabapentin (NEURONTIN) 100 MG capsule Take 100 mg by mouth 2 (two) times daily.    Marland Kitchen guaiFENesin-dextromethorphan (ROBITUSSIN DM) 100-10 MG/5ML syrup Take 10 mLs by mouth at bedtime as needed for cough.    . loratadine (CLARITIN) 10 MG tablet Take 10 mg by mouth daily as needed for allergies.     . methylPREDNISolone (MEDROL DOSEPAK) 4 MG TBPK tablet Take 4 mg by mouth as directed.    . Multiple Vitamin (MULTIVITAMIN WITH MINERALS) TABS Take 1 tablet by mouth daily.    . ranitidine (ZANTAC) 150 MG tablet Take 150 mg by mouth daily.    . traMADol (ULTRAM) 50 MG tablet Take 1 tablet (50 mg total) by mouth every 6 (six) hours as needed. 10 tablet 0  . warfarin (COUMADIN) 5 MG tablet Take 7.5 mg by mouth every evening.      No current facility-administered medications for this visit.    Past Medical History  Diagnosis Date  . COPD (chronic obstructive pulmonary disease) (HCC)     GOLD stage IV COPD on home oxygen;   . Pulmonary embolism ([place])     Small LLL sub-segmental PE seen on CT 11/2011, unchanged from CT in May 2013; Chronic Coumadin Therapy  . Nonischemic cardiomyopathy (HCC)     Mild-EF 45-50%  . CHB (complete heart block) (HCC)     S/P PPM with CRT upgrade  in 2008  . Tobacco abuse      Quit in the 1990s  . Hypercholesterolemia   . Peripheral neuropathy ([place])   . Hypertension   . Arthritis   . Allergic rhinitis   . Prostate cancer [facility])     s/p Prostatectomy 2008   . Pulmonary HTN ([place])     [place] 11/28/11 revealed minimally elevated PA pressures, nl wedge/cardiac output  . Aortic dissection (HCC)     Localized ascending aortic dissection seen on CT 11/2011, unchanged from CT in May 2013, ? artifact  . Chronic respiratory failure with hypoxia ([place])   . Dyspnea     chronic    Past Surgical History  Procedure Laterality Date  . Prostatectomy    . Bi-ventricular pacemaker insertion (crt-p)  09/15/06; 02-14-14    STJ CRTP  generator change by Dr Rayann Heman [id number]  . Right heart catheterization N/A 11/28/2011    Procedure: RIGHT HEART CATH;  Surgeon: Jolaine Artist, MD;  Location: [facility] CATH LAB;  Service: Cardiovascular;  Laterality: N/A;  . Permanent pacemaker generator change N/A 02/14/2014    Procedure: PERMANENT PACEMAKER GENERATOR CHANGE;  Surgeon: Coralyn Mark, MD;  Location: [place] CATH LAB;  Service: Cardiovascular;  Laterality: N/A;    Social History   Social History  . Marital Status: Married    Spouse Name: N/A  . Number of Children: N/A  . Years of Education: N/A   Occupational History  . 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 Smoking status: Former Smoker -- 0.50 packs/day for 52 years    Types: Cigarettes    Start date: 02/01/1938    Quit date: 05/26/1993  . Smokeless tobacco: Former Systems developer    Types: Chew    Quit date: 05/26/1990     Comment: chewed tobacco 40 years less than a pack/day  . Alcohol Use: No     Comment: denies alcohol use  . Drug Use: No  . Sexual Activity: Not on file   Other Topics Concern  . Not on file   Social History Narrative   Retired Chief Strategy Officer.  Lives with wife of 42 years.     Filed Vitals:   10/19/15 1256  BP: 120/61  Pulse: 76  Height: 5\' 11"  (1.803 m)  Weight: 142 lb (64.411 kg)  SpO2: 100%    PHYSICAL EXAM General: NAD, frail, elderly, using oxygen by nasal cannula. Neck: No JVD, no thyromegaly.  Lungs: Markedly diminished bilaterally. No rales or wheezes. CV: Nondisplaced PMI. Regular rate and rhythm, normal S1/S2, no S3/S4, III/VI late-peaking ejection systolic murmur best heard at RUSB, II/IV holodiastolic murmur best heard at left lower sternal border. No pretibial or periankle edema.  Abdomen: Soft, no distention.  Neurologic: Alert and oriented.  Psych: Somewhat flat affect.  Skin: Normal.   ECG: Most recent ECG reviewed.      ASSESSMENT AND PLAN:  Biventricular cardiac pacemaker Followed by Dr. Rayann Heman.  Most recent device interrogation noted above.   Nonischemic cardiomyopathy/chronic systolic heart failure Previously had normal LV systolic function, as confirmed by echocardiogram in March 2015. He had normal coronary arteries, by cardiac catheterization in 2012.  However, echocardiogram on 10/11/14 demonstrated severely reduced left ventricular systolic function, [id number]. Continue Coreg and Lasix. No longer on losartan.  May repeat echo at next visit.  History of pulmonary embolus  On chronic warfarin for anticoagulation, followed by primary M.D.   Valvular heart disease  Mild aortic stenosis and moderate to severe aortic  regurgitation by echo on 10/11/14, with moderate LV dilatation and reduced EF of 35%.  Pulmonary hypertension  Assessed as moderate, by echocardiogram in 07/2013. Not commented on in 09/2014.  Aortic root dilatation  Mild to moderate as per echo in May 2016. Normal in size by echo done in 07/2013. Continue to monitor.   Essential hypertension Controlled. No changes.  COPD Follow up with pulmonary.  Dispo: f/u 6 months.   Kate Sable, M.D., F.A.C.C.

## 2015-10-23 ENCOUNTER — Other Ambulatory Visit: Payer: Self-pay | Admitting: *Deleted

## 2015-10-23 NOTE — Telephone Encounter (Signed)
Refill request received today from [facility] for losartan 50 mg daily. While patient was in the office on 10/19/15, medications reviewed since patient brought all medications to the office for that visit. Losartan was not in the bag with other medications. Refill request for losartan 50 mg is denied. Patient must contact office. This message was sent to [facility].

## 2015-10-31 ENCOUNTER — Encounter: Payer: Medicare Other | Admitting: Internal Medicine

## 2015-11-19 ENCOUNTER — Inpatient Hospital Stay
Admission: AD | Admit: 2015-11-19 | Discharge: 2015-12-25 | Disposition: E | Payer: Self-pay | Source: Ambulatory Visit | Attending: Internal Medicine | Admitting: Internal Medicine

## 2015-11-19 ENCOUNTER — Other Ambulatory Visit (HOSPITAL_COMMUNITY): Payer: Self-pay

## 2015-11-19 DIAGNOSIS — J9690 Respiratory failure, unspecified, unspecified whether with hypoxia or hypercapnia: Secondary | ICD-10-CM

## 2015-11-19 DIAGNOSIS — T17908A Unspecified foreign body in respiratory tract, part unspecified causing other injury, initial encounter: Secondary | ICD-10-CM

## 2015-11-20 ENCOUNTER — Other Ambulatory Visit (HOSPITAL_COMMUNITY): Payer: Self-pay

## 2015-11-20 LAB — BLOOD GAS, ARTERIAL
ACID-BASE EXCESS: 11.4 mmol/L — AB (ref 0.0–2.0)
Bicarbonate: 36.6 mEq/L — ABNORMAL HIGH (ref 20.0–24.0)
Delivery systems: POSITIVE
EXPIRATORY PAP: 6
FIO2: 0.35
INSPIRATORY PAP: 12
LHR: 14 {breaths}/min
O2 SAT: 98.6 %
PO2 ART: 134 mmHg — AB (ref 80.0–100.0)
Patient temperature: 98.6
TCO2: 38.5 mmol/L (ref 0–100)
pCO2 arterial: 60.7 mmHg (ref 35.0–45.0)
pH, Arterial: 7.398 (ref 7.350–7.450)

## 2015-11-20 LAB — CBC WITH DIFFERENTIAL/PLATELET
Basophils Absolute: 0 10*3/uL (ref 0.0–0.1)
Basophils Relative: 0 %
Eosinophils Absolute: 0 10*3/uL (ref 0.0–0.7)
Eosinophils Relative: 0 %
HEMATOCRIT: 31.7 % — AB (ref 39.0–52.0)
Hemoglobin: 9.5 g/dL — ABNORMAL LOW (ref 13.0–17.0)
LYMPHS PCT: 16 %
Lymphs Abs: 1.2 10*3/uL (ref 0.7–4.0)
MCH: 25.7 pg — AB (ref 26.0–34.0)
MCHC: 30 g/dL (ref 30.0–36.0)
MCV: 85.9 fL (ref 78.0–100.0)
MONO ABS: 0.5 10*3/uL (ref 0.1–1.0)
Monocytes Relative: 6 %
NEUTROS ABS: 5.6 10*3/uL (ref 1.7–7.7)
Neutrophils Relative %: 78 %
PLATELETS: 179 10*3/uL (ref 150–400)
RBC: 3.69 MIL/uL — ABNORMAL LOW (ref 4.22–5.81)
RDW: 17.7 % — AB (ref 11.5–15.5)
WBC: 7.2 10*3/uL (ref 4.0–10.5)

## 2015-11-20 LAB — PROTIME-INR
INR: 2.47 — ABNORMAL HIGH (ref 0.00–1.49)
INR: 2.33 — AB (ref 0.00–1.49)
Prothrombin Time: 26.5 seconds — ABNORMAL HIGH (ref 11.6–15.2)
Prothrombin Time: 25.3 seconds — ABNORMAL HIGH (ref 11.6–15.2)

## 2015-11-20 LAB — COMPREHENSIVE METABOLIC PANEL
ALBUMIN: 2.4 g/dL — AB (ref 3.5–5.0)
ALT: 23 U/L (ref 17–63)
ANION GAP: 4 — AB (ref 5–15)
AST: 21 U/L (ref 15–41)
Alkaline Phosphatase: 52 U/L (ref 38–126)
BILIRUBIN TOTAL: 0.5 mg/dL (ref 0.3–1.2)
BUN: 36 mg/dL — ABNORMAL HIGH (ref 6–20)
CO2: 35 mmol/L — ABNORMAL HIGH (ref 22–32)
Calcium: 9.1 mg/dL (ref 8.9–10.3)
Chloride: 97 mmol/L — ABNORMAL LOW (ref 101–111)
Creatinine, Ser: 1.44 mg/dL — ABNORMAL HIGH (ref 0.61–1.24)
GFR calc Af Amer: 50 mL/min — ABNORMAL LOW (ref >60)
GFR calc non Af Amer: 43 mL/min — ABNORMAL LOW (ref >60)
GLUCOSE: 96 mg/dL (ref 65–99)
POTASSIUM: 5.1 mmol/L (ref 3.5–5.1)
SODIUM: 136 mmol/L (ref 135–145)
TOTAL PROTEIN: 4.9 g/dL — AB (ref 6.5–8.1)

## 2015-11-20 LAB — PROCALCITONIN

## 2015-11-20 LAB — VANCOMYCIN, RANDOM: VANCOMYCIN RM: 20

## 2015-11-20 LAB — MAGNESIUM: MAGNESIUM: 2.2 mg/dL (ref 1.7–2.4)

## 2015-11-20 LAB — TSH: TSH: 2.727 u[IU]/mL (ref 0.350–4.500)

## 2015-11-20 LAB — PHOSPHORUS: Phosphorus: 2.9 mg/dL (ref 2.5–4.6)

## 2015-11-21 LAB — CBC WITH DIFFERENTIAL/PLATELET
BASOS PCT: 0 %
Basophils Absolute: 0 10*3/uL (ref 0.0–0.1)
EOS ABS: 0.1 10*3/uL (ref 0.0–0.7)
Eosinophils Relative: 1 %
HCT: 29.1 % — ABNORMAL LOW (ref 39.0–52.0)
Hemoglobin: 8.8 g/dL — ABNORMAL LOW (ref 13.0–17.0)
LYMPHS ABS: 0.9 10*3/uL (ref 0.7–4.0)
Lymphocytes Relative: 16 %
MCH: 26.7 pg (ref 26.0–34.0)
MCHC: 30.2 g/dL (ref 30.0–36.0)
MCV: 88.2 fL (ref 78.0–100.0)
MONOS PCT: 5 %
Monocytes Absolute: 0.3 10*3/uL (ref 0.1–1.0)
NEUTROS ABS: 4.5 10*3/uL (ref 1.7–7.7)
NEUTROS PCT: 78 %
PLATELETS: 155 10*3/uL (ref 150–400)
RBC: 3.3 MIL/uL — AB (ref 4.22–5.81)
RDW: 17.9 % — AB (ref 11.5–15.5)
WBC: 5.8 10*3/uL (ref 4.0–10.5)

## 2015-11-21 LAB — BASIC METABOLIC PANEL
Anion gap: 4 — ABNORMAL LOW (ref 5–15)
BUN: 28 mg/dL — ABNORMAL HIGH (ref 6–20)
CALCIUM: 8.5 mg/dL — AB (ref 8.9–10.3)
CHLORIDE: 98 mmol/L — AB (ref 101–111)
CO2: 35 mmol/L — AB (ref 22–32)
CREATININE: 1.31 mg/dL — AB (ref 0.61–1.24)
GFR calc Af Amer: 56 mL/min — ABNORMAL LOW (ref >60)
GFR calc non Af Amer: 48 mL/min — ABNORMAL LOW (ref >60)
GLUCOSE: 75 mg/dL (ref 65–99)
Potassium: 4.5 mmol/L (ref 3.5–5.1)
Sodium: 137 mmol/L (ref 135–145)

## 2015-11-21 LAB — PROTIME-INR
INR: 2.53 — ABNORMAL HIGH (ref 0.00–1.49)
PROTHROMBIN TIME: 26.9 s — AB (ref 11.6–15.2)

## 2015-11-21 LAB — PHOSPHORUS: PHOSPHORUS: 2.9 mg/dL (ref 2.5–4.6)

## 2015-11-21 LAB — MAGNESIUM: Magnesium: 2.1 mg/dL (ref 1.7–2.4)

## 2015-11-21 LAB — HEMOGLOBIN A1C
HEMOGLOBIN A1C: 6 % — AB (ref 4.8–5.6)
Mean Plasma Glucose: 126 mg/dL

## 2015-11-22 LAB — BASIC METABOLIC PANEL
Anion gap: 4 — ABNORMAL LOW (ref 5–15)
BUN: 24 mg/dL — AB (ref 6–20)
CHLORIDE: 99 mmol/L — AB (ref 101–111)
CO2: 34 mmol/L — ABNORMAL HIGH (ref 22–32)
Calcium: 8.2 mg/dL — ABNORMAL LOW (ref 8.9–10.3)
Creatinine, Ser: 1.33 mg/dL — ABNORMAL HIGH (ref 0.61–1.24)
GFR calc Af Amer: 55 mL/min — ABNORMAL LOW (ref >60)
GFR calc non Af Amer: 47 mL/min — ABNORMAL LOW (ref >60)
GLUCOSE: 86 mg/dL (ref 65–99)
POTASSIUM: 4.7 mmol/L (ref 3.5–5.1)
SODIUM: 137 mmol/L (ref 135–145)

## 2015-11-22 LAB — CBC WITH DIFFERENTIAL/PLATELET
Basophils Absolute: 0 10*3/uL (ref 0.0–0.1)
Basophils Relative: 0 %
Eosinophils Absolute: 0.1 10*3/uL (ref 0.0–0.7)
Eosinophils Relative: 1 %
HCT: 30 % — ABNORMAL LOW (ref 39.0–52.0)
HEMOGLOBIN: 8.9 g/dL — AB (ref 13.0–17.0)
LYMPHS ABS: 0.6 10*3/uL — AB (ref 0.7–4.0)
LYMPHS PCT: 12 %
MCH: 26.6 pg (ref 26.0–34.0)
MCHC: 29.7 g/dL — ABNORMAL LOW (ref 30.0–36.0)
MCV: 89.6 fL (ref 78.0–100.0)
Monocytes Absolute: 0.6 10*3/uL (ref 0.1–1.0)
Monocytes Relative: 11 %
NEUTROS PCT: 76 %
Neutro Abs: 3.9 10*3/uL (ref 1.7–7.7)
Platelets: 184 10*3/uL (ref 150–400)
RBC: 3.35 MIL/uL — AB (ref 4.22–5.81)
RDW: 18.1 % — ABNORMAL HIGH (ref 11.5–15.5)
WBC: 5.1 10*3/uL (ref 4.0–10.5)

## 2015-11-22 LAB — PROTIME-INR
INR: 2.51 — ABNORMAL HIGH (ref 0.00–1.49)
PROTHROMBIN TIME: 26.8 s — AB (ref 11.6–15.2)

## 2015-11-23 LAB — PROTIME-INR
INR: 2.3 — ABNORMAL HIGH (ref 0.00–1.49)
PROTHROMBIN TIME: 25.1 s — AB (ref 11.6–15.2)

## 2015-11-24 LAB — BASIC METABOLIC PANEL
Anion gap: 7 (ref 5–15)
BUN: 24 mg/dL — AB (ref 6–20)
CALCIUM: 8.5 mg/dL — AB (ref 8.9–10.3)
CO2: 35 mmol/L — ABNORMAL HIGH (ref 22–32)
CREATININE: 1.48 mg/dL — AB (ref 0.61–1.24)
Chloride: 98 mmol/L — ABNORMAL LOW (ref 101–111)
GFR calc Af Amer: 48 mL/min — ABNORMAL LOW (ref >60)
GFR, EST NON AFRICAN AMERICAN: 41 mL/min — AB (ref >60)
Glucose, Bld: 111 mg/dL — ABNORMAL HIGH (ref 65–99)
Potassium: 4.2 mmol/L (ref 3.5–5.1)
SODIUM: 140 mmol/L (ref 135–145)

## 2015-11-24 LAB — CBC WITH DIFFERENTIAL/PLATELET
BASOS ABS: 0 10*3/uL (ref 0.0–0.1)
Basophils Relative: 0 %
EOS PCT: 2 %
Eosinophils Absolute: 0.1 10*3/uL (ref 0.0–0.7)
HEMATOCRIT: 28.7 % — AB (ref 39.0–52.0)
HEMOGLOBIN: 8.4 g/dL — AB (ref 13.0–17.0)
LYMPHS ABS: 1.1 10*3/uL (ref 0.7–4.0)
LYMPHS PCT: 22 %
MCH: 27 pg (ref 26.0–34.0)
MCHC: 29.3 g/dL — AB (ref 30.0–36.0)
MCV: 92.3 fL (ref 78.0–100.0)
Monocytes Absolute: 0.6 10*3/uL (ref 0.1–1.0)
Monocytes Relative: 11 %
NEUTROS ABS: 3.4 10*3/uL (ref 1.7–7.7)
NEUTROS PCT: 65 %
PLATELETS: 217 10*3/uL (ref 150–400)
RBC: 3.11 MIL/uL — AB (ref 4.22–5.81)
RDW: 18 % — AB (ref 11.5–15.5)
WBC: 5.2 10*3/uL (ref 4.0–10.5)

## 2015-11-24 LAB — PROTIME-INR
INR: 2.75 — AB (ref 0.00–1.49)
Prothrombin Time: 28.7 seconds — ABNORMAL HIGH (ref 11.6–15.2)

## 2015-11-24 LAB — PHOSPHORUS: Phosphorus: 2.5 mg/dL (ref 2.5–4.6)

## 2015-11-24 LAB — MAGNESIUM: MAGNESIUM: 2.1 mg/dL (ref 1.7–2.4)

## 2015-11-24 DEATH — deceased

## 2015-11-25 ENCOUNTER — Other Ambulatory Visit (HOSPITAL_COMMUNITY): Payer: Self-pay

## 2015-11-25 LAB — PROTIME-INR
INR: 3.19 — ABNORMAL HIGH (ref 0.00–1.49)
Prothrombin Time: 32 seconds — ABNORMAL HIGH (ref 11.6–15.2)

## 2015-11-26 LAB — CBC WITH DIFFERENTIAL/PLATELET
BASOS ABS: 0 10*3/uL (ref 0.0–0.1)
BASOS PCT: 0 %
EOS ABS: 0 10*3/uL (ref 0.0–0.7)
Eosinophils Relative: 1 %
HEMATOCRIT: 28.4 % — AB (ref 39.0–52.0)
HEMOGLOBIN: 7.9 g/dL — AB (ref 13.0–17.0)
Lymphocytes Relative: 25 %
Lymphs Abs: 1.4 10*3/uL (ref 0.7–4.0)
MCH: 26.2 pg (ref 26.0–34.0)
MCHC: 27.8 g/dL — AB (ref 30.0–36.0)
MCV: 94.4 fL (ref 78.0–100.0)
MONOS PCT: 12 %
Monocytes Absolute: 0.7 10*3/uL (ref 0.1–1.0)
NEUTROS ABS: 3.6 10*3/uL (ref 1.7–7.7)
NEUTROS PCT: 62 %
Platelets: 215 10*3/uL (ref 150–400)
RBC: 3.01 MIL/uL — ABNORMAL LOW (ref 4.22–5.81)
RDW: 18 % — ABNORMAL HIGH (ref 11.5–15.5)
WBC: 5.8 10*3/uL (ref 4.0–10.5)

## 2015-11-26 LAB — MAGNESIUM: MAGNESIUM: 2 mg/dL (ref 1.7–2.4)

## 2015-11-26 LAB — RENAL FUNCTION PANEL
ALBUMIN: 1.9 g/dL — AB (ref 3.5–5.0)
ANION GAP: 18 — AB (ref 5–15)
BUN: 27 mg/dL — AB (ref 6–20)
CALCIUM: 9.3 mg/dL (ref 8.9–10.3)
CO2: 32 mmol/L (ref 22–32)
Chloride: 94 mmol/L — ABNORMAL LOW (ref 101–111)
Creatinine, Ser: 1.37 mg/dL — ABNORMAL HIGH (ref 0.61–1.24)
GFR calc Af Amer: 53 mL/min — ABNORMAL LOW (ref >60)
GFR, EST NON AFRICAN AMERICAN: 45 mL/min — AB (ref >60)
GLUCOSE: 76 mg/dL (ref 65–99)
PHOSPHORUS: 2 mg/dL — AB (ref 2.5–4.6)
Potassium: 4.6 mmol/L (ref 3.5–5.1)
SODIUM: 144 mmol/L (ref 135–145)

## 2015-11-26 LAB — PROTIME-INR
INR: 3.16 — AB (ref 0.00–1.49)
PROTHROMBIN TIME: 31.9 s — AB (ref 11.6–15.2)

## 2015-11-27 LAB — PROTIME-INR
INR: 2.86 — ABNORMAL HIGH (ref 0.00–1.49)
Prothrombin Time: 29.5 seconds — ABNORMAL HIGH (ref 11.6–15.2)

## 2015-11-28 LAB — RENAL FUNCTION PANEL
ALBUMIN: 1.9 g/dL — AB (ref 3.5–5.0)
Anion gap: 5 (ref 5–15)
BUN: 29 mg/dL — ABNORMAL HIGH (ref 6–20)
CALCIUM: 8.5 mg/dL — AB (ref 8.9–10.3)
CO2: 34 mmol/L — AB (ref 22–32)
CREATININE: 1.66 mg/dL — AB (ref 0.61–1.24)
Chloride: 99 mmol/L — ABNORMAL LOW (ref 101–111)
GFR calc Af Amer: 42 mL/min — ABNORMAL LOW (ref >60)
GFR calc non Af Amer: 36 mL/min — ABNORMAL LOW (ref >60)
GLUCOSE: 86 mg/dL (ref 65–99)
PHOSPHORUS: 3.2 mg/dL (ref 2.5–4.6)
Potassium: 4.8 mmol/L (ref 3.5–5.1)
SODIUM: 138 mmol/L (ref 135–145)

## 2015-11-28 LAB — CBC WITH DIFFERENTIAL/PLATELET
BASOS PCT: 0 %
Basophils Absolute: 0 10*3/uL (ref 0.0–0.1)
EOS ABS: 0.1 10*3/uL (ref 0.0–0.7)
EOS PCT: 1 %
HCT: 28.8 % — ABNORMAL LOW (ref 39.0–52.0)
Hemoglobin: 8.3 g/dL — ABNORMAL LOW (ref 13.0–17.0)
LYMPHS ABS: 1.7 10*3/uL (ref 0.7–4.0)
Lymphocytes Relative: 26 %
MCH: 26.6 pg (ref 26.0–34.0)
MCHC: 28.8 g/dL — AB (ref 30.0–36.0)
MCV: 92.3 fL (ref 78.0–100.0)
MONOS PCT: 12 %
Monocytes Absolute: 0.8 10*3/uL (ref 0.1–1.0)
NEUTROS PCT: 61 %
Neutro Abs: 4 10*3/uL (ref 1.7–7.7)
PLATELETS: 224 10*3/uL (ref 150–400)
RBC: 3.12 MIL/uL — AB (ref 4.22–5.81)
RDW: 17.8 % — ABNORMAL HIGH (ref 11.5–15.5)
WBC: 6.6 10*3/uL (ref 4.0–10.5)

## 2015-11-28 LAB — PROTIME-INR
INR: 2.48 — ABNORMAL HIGH (ref 0.00–1.49)
PROTHROMBIN TIME: 26.5 s — AB (ref 11.6–15.2)

## 2015-11-28 LAB — MAGNESIUM: Magnesium: 2.1 mg/dL (ref 1.7–2.4)

## 2015-12-25 NOTE — Code Documentation (Addendum)
  Patient Name: [name] [name]   MRN: [id number]   Date of Birth/ Sex: 1928/01/24 , male      Admission Date: 11/22/2015  Attending Provider: Merton Border, MD  Primary Diagnosis: complex respiratory   Indication: Pt was in his usual state of health until this PM when he was noted to be hypoxic, unresponsive, pulseless. Code blue was subsequently called. At the time of arrival on scene, ACLS protocol was underway.   Technical Description:  - CPR performance duration:  15 minutes  - Was defibrillation or cardioversion used? No   - Was external pacer placed? No  - Was patient intubated pre/post CPR? No   Medications Administered: Y = Yes; Blank = No Amiodarone    Atropine    Calcium  Y  Epinephrine  Y  Lidocaine    Magnesium    Norepinephrine    Phenylephrine    Sodium bicarbonate    Vasopressin    Other    Post CPR evaluation:  - Final Status - Was patient successfully resuscitated ? No   Miscellaneous Information:  - Time of death:  8:41  PM  - Primary team notified?  Yes  - Family Notified? Yes     Dellia Nims, MD   Dec 24, 2015, 8:48 PM

## 2015-12-25 DEATH — deceased

## 2016-12-24 IMAGING — DX DG CHEST 1V PORT
2 series · 2 of 2 positions shown · non-contrast
Comparison: 11/19/2015

CLINICAL DATA: Respiratory distress

EXAM:
PORTABLE CHEST 1 VIEW

[chest ap (1 of 2)]
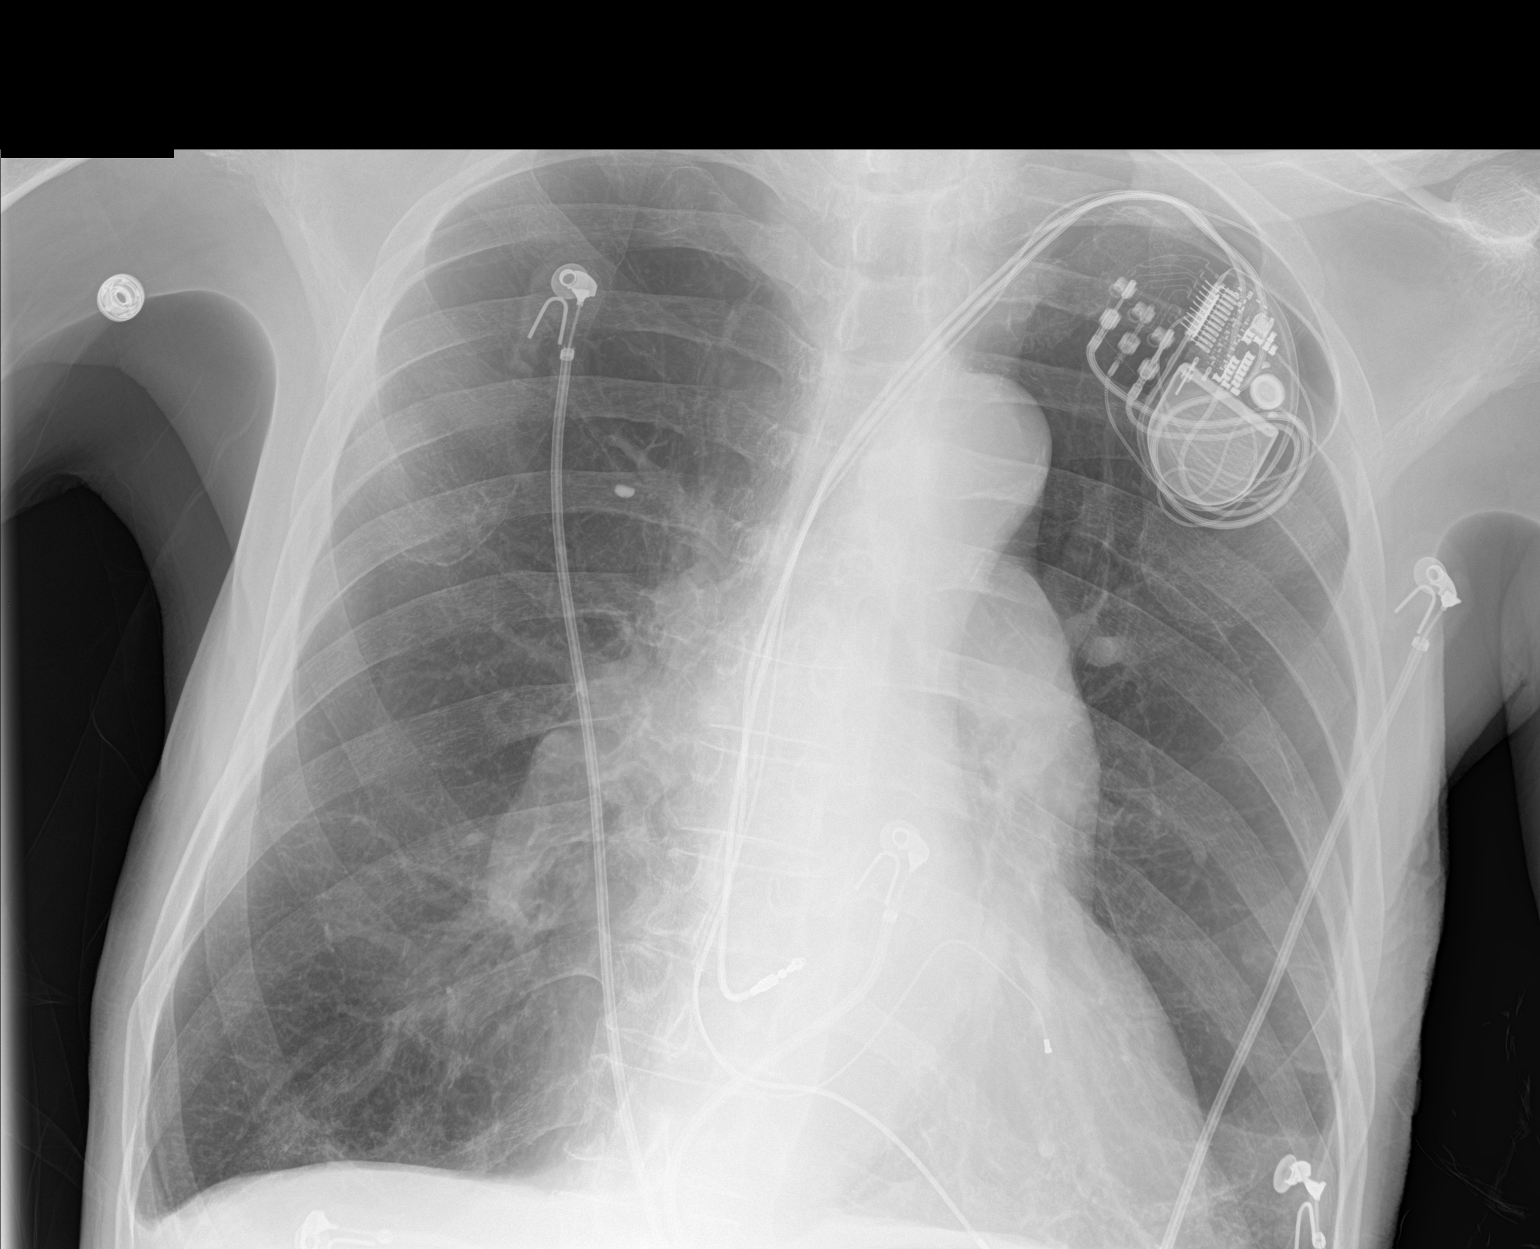

[chest ap (2 of 2)]
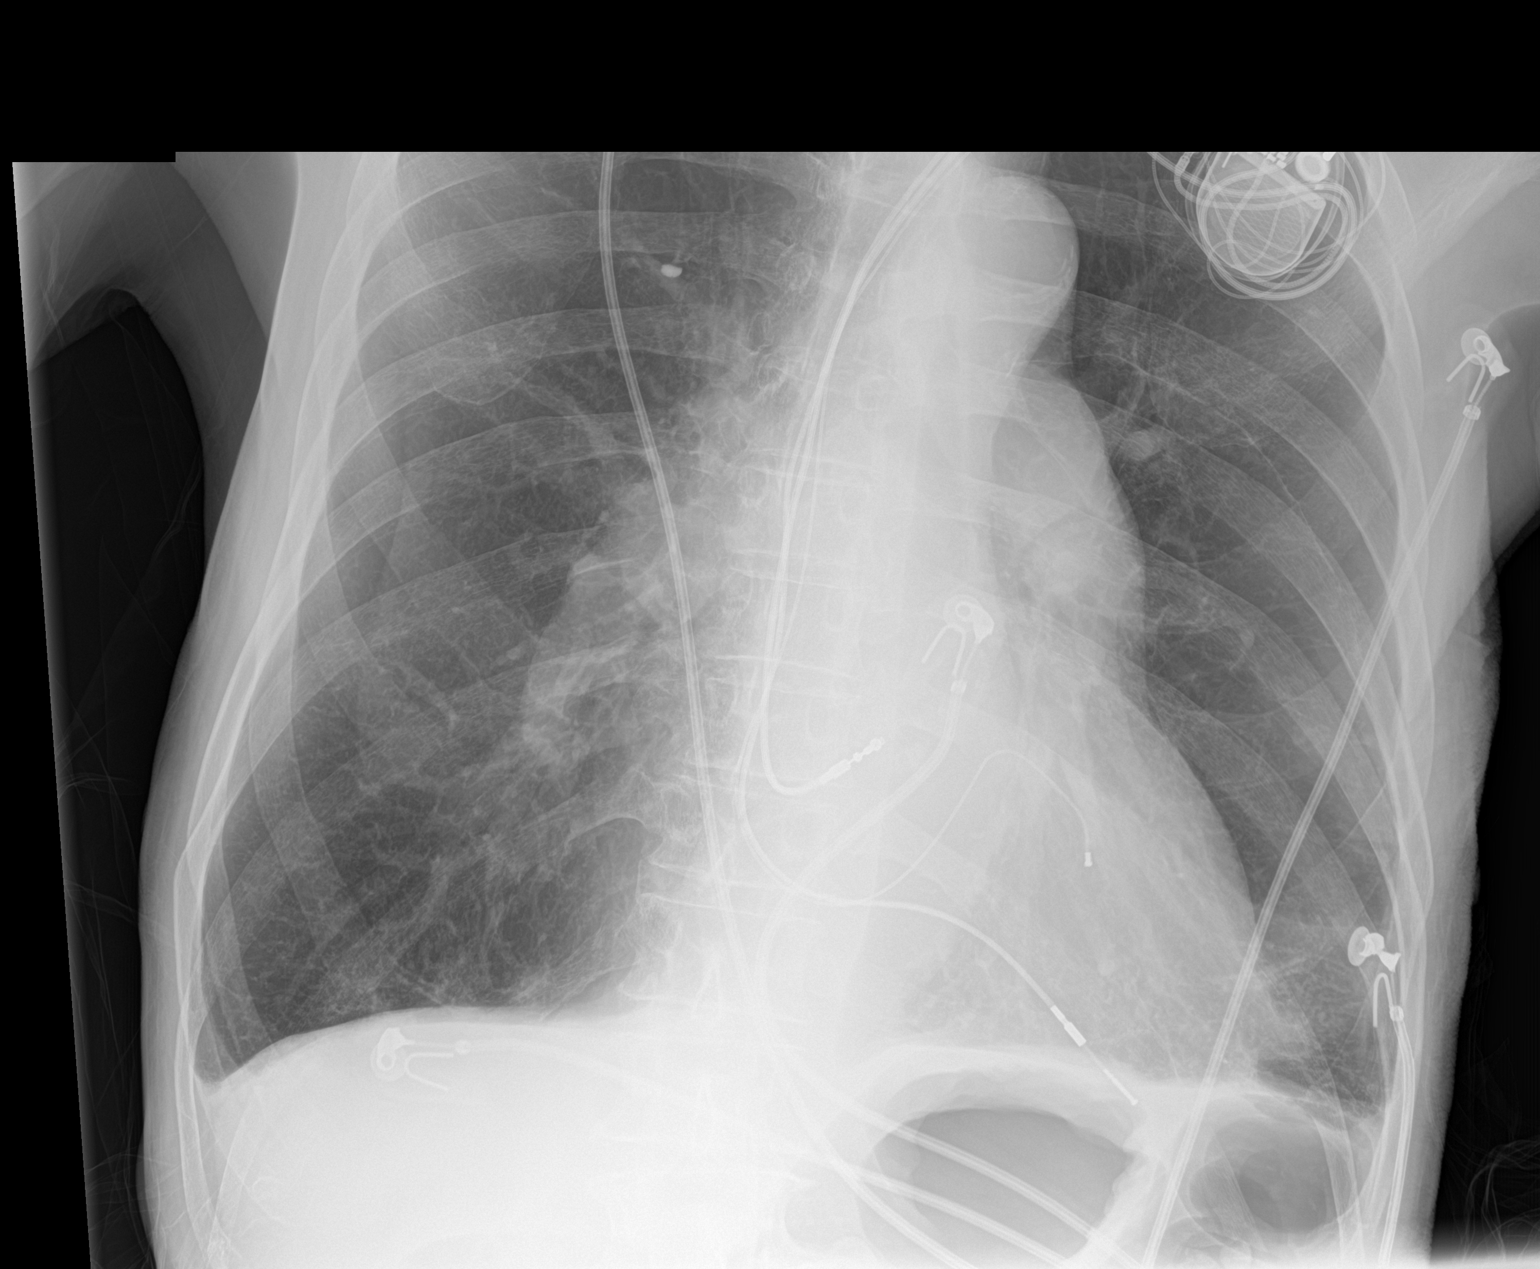

[2 of 2 positions shown; findings below may reference images not displayed]

FINDINGS: Chronic cardiomegaly. Enlarged pulmonary arteries. Biventricular
ICD/pacer from the left. Emphysematous changes. There is no edema,
consolidation, effusion, or pneumothorax.
IMPRESSION: 1. No acute finding.
2. Cardiomegaly and pulmonary hypertension.
3. Emphysema.
